# Patient Record
Sex: Female | Born: 1975
Health system: Southern US, Community
[De-identification: ages and names within clinical notes are randomized; demographics above are authoritative.]

## PROBLEM LIST (undated history)

## (undated) DIAGNOSIS — A64 Unspecified sexually transmitted disease: Secondary | ICD-10-CM

## (undated) DIAGNOSIS — R87629 Unspecified abnormal cytological findings in specimens from vagina: Secondary | ICD-10-CM

## (undated) DIAGNOSIS — F419 Anxiety disorder, unspecified: Secondary | ICD-10-CM

## (undated) DIAGNOSIS — Z9109 Other allergy status, other than to drugs and biological substances: Secondary | ICD-10-CM

## (undated) DIAGNOSIS — N841 Polyp of cervix uteri: Secondary | ICD-10-CM

## (undated) DIAGNOSIS — R011 Cardiac murmur, unspecified: Secondary | ICD-10-CM

## (undated) DIAGNOSIS — A630 Anogenital (venereal) warts: Secondary | ICD-10-CM

## (undated) DIAGNOSIS — K219 Gastro-esophageal reflux disease without esophagitis: Secondary | ICD-10-CM

## (undated) DIAGNOSIS — J40 Bronchitis, not specified as acute or chronic: Secondary | ICD-10-CM

## (undated) DIAGNOSIS — R51 Headache: Secondary | ICD-10-CM

## (undated) DIAGNOSIS — I10 Essential (primary) hypertension: Secondary | ICD-10-CM

## (undated) DIAGNOSIS — R519 Headache, unspecified: Secondary | ICD-10-CM

## (undated) DIAGNOSIS — T7840XA Allergy, unspecified, initial encounter: Secondary | ICD-10-CM

## (undated) DIAGNOSIS — I493 Ventricular premature depolarization: Secondary | ICD-10-CM

## (undated) DIAGNOSIS — Z87898 Personal history of other specified conditions: Secondary | ICD-10-CM

## (undated) HISTORY — DX: Allergy, unspecified, initial encounter: T78.40XA

## (undated) HISTORY — DX: Anogenital (venereal) warts: A63.0

## (undated) HISTORY — DX: Unspecified abnormal cytological findings in specimens from vagina: R87.629

## (undated) HISTORY — DX: Morbid (severe) obesity due to excess calories: E66.01

## (undated) HISTORY — DX: Unspecified sexually transmitted disease: A64

## (undated) HISTORY — DX: Ventricular premature depolarization: I49.3

## (undated) HISTORY — DX: Essential (primary) hypertension: I10

## (undated) HISTORY — DX: Bronchitis, not specified as acute or chronic: J40

## (undated) HISTORY — DX: Gastro-esophageal reflux disease without esophagitis: K21.9

## (undated) HISTORY — DX: Polyp of cervix uteri: N84.1

## (undated) HISTORY — DX: Anxiety disorder, unspecified: F41.9

## (undated) HISTORY — PX: WISDOM TOOTH EXTRACTION: SHX21

## (undated) HISTORY — DX: Headache: R51

## (undated) HISTORY — DX: Other allergy status, other than to drugs and biological substances: Z91.09

## (undated) HISTORY — DX: Headache, unspecified: R51.9

## (undated) HISTORY — DX: Personal history of other specified conditions: Z87.898

---

## 2006-04-11 ENCOUNTER — Other Ambulatory Visit: Admission: RE | Admit: 2006-04-11 | Discharge: 2006-04-11 | Payer: Self-pay | Admitting: Family Medicine

## 2006-08-01 ENCOUNTER — Emergency Department (HOSPITAL_COMMUNITY): Admission: EM | Admit: 2006-08-01 | Discharge: 2006-08-01 | Payer: Self-pay | Admitting: Emergency Medicine

## 2006-11-16 ENCOUNTER — Emergency Department (HOSPITAL_COMMUNITY): Admission: EM | Admit: 2006-11-16 | Discharge: 2006-11-17 | Payer: Self-pay | Admitting: Emergency Medicine

## 2006-11-28 ENCOUNTER — Emergency Department (HOSPITAL_COMMUNITY): Admission: EM | Admit: 2006-11-28 | Discharge: 2006-11-29 | Payer: Self-pay | Admitting: Emergency Medicine

## 2007-02-23 ENCOUNTER — Emergency Department (HOSPITAL_COMMUNITY): Admission: EM | Admit: 2007-02-23 | Discharge: 2007-02-23 | Payer: Self-pay | Admitting: Emergency Medicine

## 2007-04-17 ENCOUNTER — Other Ambulatory Visit: Admission: RE | Admit: 2007-04-17 | Discharge: 2007-04-17 | Payer: Self-pay | Admitting: Family Medicine

## 2007-05-23 ENCOUNTER — Encounter: Admission: RE | Admit: 2007-05-23 | Discharge: 2007-06-26 | Payer: Self-pay | Admitting: Family Medicine

## 2008-05-12 ENCOUNTER — Other Ambulatory Visit: Admission: RE | Admit: 2008-05-12 | Discharge: 2008-05-12 | Payer: Self-pay | Admitting: Family Medicine

## 2008-11-10 ENCOUNTER — Other Ambulatory Visit: Admission: RE | Admit: 2008-11-10 | Discharge: 2008-11-10 | Payer: Self-pay | Admitting: Family Medicine

## 2009-05-29 ENCOUNTER — Other Ambulatory Visit: Admission: RE | Admit: 2009-05-29 | Discharge: 2009-05-29 | Payer: Self-pay | Admitting: Family Medicine

## 2010-04-06 ENCOUNTER — Inpatient Hospital Stay (HOSPITAL_COMMUNITY)
Admission: AD | Admit: 2010-04-06 | Discharge: 2010-04-06 | Payer: Self-pay | Source: Home / Self Care | Admitting: Obstetrics and Gynecology

## 2010-04-08 ENCOUNTER — Inpatient Hospital Stay (HOSPITAL_COMMUNITY)
Admission: AD | Admit: 2010-04-08 | Discharge: 2010-04-08 | Payer: Self-pay | Source: Home / Self Care | Admitting: Obstetrics and Gynecology

## 2010-04-08 DIAGNOSIS — O200 Threatened abortion: Secondary | ICD-10-CM

## 2010-04-10 ENCOUNTER — Ambulatory Visit (HOSPITAL_COMMUNITY)
Admission: RE | Admit: 2010-04-10 | Discharge: 2010-04-10 | Payer: Self-pay | Source: Home / Self Care | Attending: Obstetrics and Gynecology | Admitting: Obstetrics and Gynecology

## 2010-05-02 DIAGNOSIS — Z87898 Personal history of other specified conditions: Secondary | ICD-10-CM

## 2010-05-02 HISTORY — DX: Personal history of other specified conditions: Z87.898

## 2010-05-02 HISTORY — PX: COLPOSCOPY: SHX161

## 2010-07-13 LAB — HCG, QUANTITATIVE, PREGNANCY
hCG, Beta Chain, Quant, S: 5391 m[IU]/mL — ABNORMAL HIGH (ref <5)
hCG, Beta Chain, Quant, S: 7012 m[IU]/mL — ABNORMAL HIGH (ref <5)

## 2010-07-13 LAB — CBC
HCT: 36.6 % (ref 36.0–46.0)
Hemoglobin: 12 g/dL (ref 12.0–15.0)
MCH: 27.2 pg (ref 26.0–34.0)
MCHC: 32.7 g/dL (ref 30.0–36.0)
MCV: 83.3 fL (ref 78.0–100.0)
Platelets: 344 10*3/uL (ref 150–400)
RBC: 4.39 MIL/uL (ref 3.87–5.11)
RDW: 15.2 % (ref 11.5–15.5)
WBC: 12 10*3/uL — ABNORMAL HIGH (ref 4.0–10.5)

## 2010-07-13 LAB — URINALYSIS, ROUTINE W REFLEX MICROSCOPIC
Bilirubin Urine: NEGATIVE
Glucose, UA: NEGATIVE mg/dL
Ketones, ur: NEGATIVE mg/dL
Nitrite: NEGATIVE
Specific Gravity, Urine: <1.005 — ABNORMAL LOW (ref 1.005–1.030)
pH: 6 (ref 5.0–8.0)

## 2010-07-13 LAB — ABO/RH: ABO/RH(D): A POS

## 2010-07-13 LAB — URINE MICROSCOPIC-ADD ON

## 2010-07-28 ENCOUNTER — Inpatient Hospital Stay (INDEPENDENT_AMBULATORY_CARE_PROVIDER_SITE_OTHER)
Admission: RE | Admit: 2010-07-28 | Discharge: 2010-07-28 | Disposition: A | Discharge status: Home / Self Care | Payer: 59 | Source: Ambulatory Visit | Attending: Emergency Medicine | Admitting: Emergency Medicine

## 2010-07-28 DIAGNOSIS — H109 Unspecified conjunctivitis: Secondary | ICD-10-CM

## 2010-07-29 ENCOUNTER — Emergency Department (HOSPITAL_COMMUNITY)
Admission: EM | Admit: 2010-07-29 | Discharge: 2010-07-30 | Disposition: A | Discharge status: Home / Self Care | Payer: 59 | Attending: Emergency Medicine | Admitting: Emergency Medicine

## 2010-07-29 DIAGNOSIS — R209 Unspecified disturbances of skin sensation: Secondary | ICD-10-CM | POA: Insufficient documentation

## 2010-07-29 DIAGNOSIS — R51 Headache: Secondary | ICD-10-CM | POA: Insufficient documentation

## 2010-07-29 DIAGNOSIS — F410 Panic disorder [episodic paroxysmal anxiety] without agoraphobia: Secondary | ICD-10-CM | POA: Insufficient documentation

## 2010-07-29 DIAGNOSIS — I10 Essential (primary) hypertension: Secondary | ICD-10-CM | POA: Insufficient documentation

## 2010-07-29 LAB — URINALYSIS, ROUTINE W REFLEX MICROSCOPIC
Bilirubin Urine: NEGATIVE
Glucose, UA: NEGATIVE mg/dL
Hgb urine dipstick: NEGATIVE
Ketones, ur: NEGATIVE mg/dL
Nitrite: NEGATIVE
Protein, ur: NEGATIVE mg/dL
Specific Gravity, Urine: 1.018 (ref 1.005–1.030)
Urobilinogen, UA: 0.2 mg/dL (ref 0.0–1.0)
pH: 6 (ref 5.0–8.0)

## 2010-07-29 LAB — POCT I-STAT, CHEM 8
BUN: 14 mg/dL (ref 6–23)
Calcium, Ion: 1 mmol/L — ABNORMAL LOW (ref 1.12–1.32)
Chloride: 104 meq/L (ref 96–112)
Creatinine, Ser: 0.9 mg/dL (ref 0.4–1.2)
Glucose, Bld: 91 mg/dL (ref 70–99)
HCT: 37 % (ref 36.0–46.0)
Hemoglobin: 12.6 g/dL (ref 12.0–15.0)
Potassium: 3.7 mEq/L (ref 3.5–5.1)
Sodium: 137 mEq/L (ref 135–145)
TCO2: 22 mmol/L (ref 0–100)

## 2010-07-29 LAB — POCT PREGNANCY, URINE: Preg Test, Ur: NEGATIVE

## 2010-10-04 ENCOUNTER — Other Ambulatory Visit: Payer: Self-pay | Admitting: Obstetrics and Gynecology

## 2010-10-04 ENCOUNTER — Other Ambulatory Visit (HOSPITAL_COMMUNITY)
Admission: RE | Admit: 2010-10-04 | Discharge: 2010-10-04 | Disposition: A | Discharge status: Home / Self Care | Payer: 59 | Source: Ambulatory Visit | Attending: Obstetrics and Gynecology | Admitting: Obstetrics and Gynecology

## 2010-10-04 DIAGNOSIS — Z01419 Encounter for gynecological examination (general) (routine) without abnormal findings: Secondary | ICD-10-CM | POA: Insufficient documentation

## 2011-01-05 ENCOUNTER — Inpatient Hospital Stay (INDEPENDENT_AMBULATORY_CARE_PROVIDER_SITE_OTHER)
Admission: RE | Admit: 2011-01-05 | Discharge: 2011-01-05 | Disposition: A | Discharge status: Home / Self Care | Payer: 59 | Source: Ambulatory Visit | Attending: Family Medicine | Admitting: Family Medicine

## 2011-01-05 DIAGNOSIS — R071 Chest pain on breathing: Secondary | ICD-10-CM

## 2011-01-19 ENCOUNTER — Ambulatory Visit: Payer: 59 | Admitting: *Deleted

## 2011-01-20 ENCOUNTER — Encounter: Payer: Self-pay | Admitting: *Deleted

## 2011-02-14 LAB — POCT CARDIAC MARKERS
CKMB, poc: <1 — ABNORMAL LOW
Myoglobin, poc: 64.9
Operator id: 1192
Troponin i, poc: <0.05

## 2011-02-14 LAB — COMPREHENSIVE METABOLIC PANEL
Albumin: 4.4
BUN: 11
Calcium: 9.5
Creatinine, Ser: 0.62
GFR calc Af Amer: >60
Total Bilirubin: 0.8
Total Protein: 7.9

## 2011-02-14 LAB — DIFFERENTIAL
Basophils Absolute: 0
Lymphocytes Relative: 22
Lymphs Abs: 2.5
Monocytes Absolute: 0.6
Monocytes Relative: 6
Neutro Abs: 8.2 — ABNORMAL HIGH

## 2011-02-14 LAB — CBC
HCT: 40.3
MCHC: 33.3
MCV: 79.9
Platelets: 413 — ABNORMAL HIGH
RDW: 14.4 — ABNORMAL HIGH

## 2011-04-21 ENCOUNTER — Encounter: Payer: Self-pay | Admitting: *Deleted

## 2011-04-21 ENCOUNTER — Encounter: Payer: 59 | Attending: Family Medicine | Admitting: *Deleted

## 2011-04-21 ENCOUNTER — Ambulatory Visit: Payer: 59 | Admitting: *Deleted

## 2011-04-21 DIAGNOSIS — Z713 Dietary counseling and surveillance: Secondary | ICD-10-CM | POA: Insufficient documentation

## 2011-04-21 NOTE — Progress Notes (Signed)
Medical Nutrition Therapy:  Appt start time: 1115  End time:  1200.  Assessment:  Morbid Obesity.  Pt here for assessment of morbid obesity. Reports an initial weight goal of <200 lbs.  Pt recently down to 245 lbs by decreasing portions, walking daily, and eliminating fried foods, but grew bored of monotony and quit.  Gained back to current wt of 270.5 lbs. Discussed restarting previous regimen with support system in place.  Meal skipping noted and discussed. Pt has strong FMH of T2DM, but BG currently WNL. States MD is following.  MEDICATIONS:  See medication list; reconciled with pt.   DIETARY INTAKE:  Usual eating pattern includes 2 meals and 1-2 snacks per day.  24-hr recall: Unable to obtain d/t lack of time. Pt arrived late and had to leave early. Will attempt to obtain at follow up.  Usual physical activity:  60 min Zumba classes, 3x/wk - (started 3 wks ago)  Estimated energy needs: 1500 calories 180 g carbohydrates 100-110 g protein 40-42 g fat  Progress Towards Goal(s):  In progress.   Nutritional Diagnosis:  [place]-3.3 Morbid obesity related to sedentary lifestyle and excessive portions at meals as evidenced by patient report of excessive food intake and a BMI of 47.9 kg/m^2.    Intervention/Goals:  Eat 3 meals/day, Avoid meal skipping.   Add lean protein-rich foods to all meals and snacks.  Limit carbohydrate to 45 (up to 60) grams per meal and 15 grams per snack.  Choose more whole grains, lean protein, low-fat dairy, and fruits/non-starchy vegetables.   Continue current physical activity regimen; Aim to increase to daily activity.  Limit sugar-sweetened beverages, concentrated sweets, high carbohydrate foods, and high fat/fried foods.  For high blood pressure, aim for <2000 mg of sodium daily. Replace salt in cooking with spices (see handout).  Aim for 25-30 grams of fiber daily.  Monitoring/Evaluation:  Dietary intake, exercise, and body weight in 4 week(s).

## 2011-04-21 NOTE — Patient Instructions (Signed)
Goals:  Eat 3 meals/day, Avoid meal skipping.   Add lean protein-rich foods to all meals and snacks.  Limit carbohydrate to 45 (up to 60) grams per meal and 15 grams per snack.  Choose more whole grains, lean protein, low-fat dairy, and fruits/non-starchy vegetables.   Continue current physical activity regimen; Aim to increase to daily activity.  Limit sugar-sweetened beverages, concentrated sweets, high carbohydrate foods, and high fat/fried foods.  For high blood pressure, aim for <2000 mg of sodium daily. Replace salt in cooking with spices (see handout).  Aim for 25-30 grams of fiber daily.

## 2011-05-20 ENCOUNTER — Ambulatory Visit: Payer: 59 | Admitting: Dietician

## 2011-05-27 ENCOUNTER — Ambulatory Visit: Payer: 59 | Admitting: Dietician

## 2011-10-12 ENCOUNTER — Encounter: Payer: Self-pay | Admitting: Family Medicine

## 2011-10-12 ENCOUNTER — Ambulatory Visit (INDEPENDENT_AMBULATORY_CARE_PROVIDER_SITE_OTHER): Payer: 59 | Admitting: Family Medicine

## 2011-10-12 VITALS — BP 130/90 | HR 85 | Temp 98.6°F | Ht 64.5 in | Wt 268.4 lb

## 2011-10-12 DIAGNOSIS — Z9109 Other allergy status, other than to drugs and biological substances: Secondary | ICD-10-CM

## 2011-10-12 DIAGNOSIS — Z833 Family history of diabetes mellitus: Secondary | ICD-10-CM

## 2011-10-12 DIAGNOSIS — J309 Allergic rhinitis, unspecified: Secondary | ICD-10-CM

## 2011-10-12 DIAGNOSIS — N390 Urinary tract infection, site not specified: Secondary | ICD-10-CM

## 2011-10-12 DIAGNOSIS — J329 Chronic sinusitis, unspecified: Secondary | ICD-10-CM

## 2011-10-12 DIAGNOSIS — I10 Essential (primary) hypertension: Secondary | ICD-10-CM | POA: Insufficient documentation

## 2011-10-12 LAB — CBC WITH DIFFERENTIAL/PLATELET
Basophils Relative: 0.5 % (ref 0.0–3.0)
Eosinophils Relative: 1.2 % (ref 0.0–5.0)
HCT: 37.8 % (ref 36.0–46.0)
Hemoglobin: 12.1 g/dL (ref 12.0–15.0)
Lymphs Abs: 2.4 10*3/uL (ref 0.7–4.0)
MCV: 82 fl (ref 78.0–100.0)
Monocytes Relative: 5.4 % (ref 3.0–12.0)
Neutro Abs: 4.6 10*3/uL (ref 1.4–7.7)
WBC: 7.5 10*3/uL (ref 4.5–10.5)

## 2011-10-12 LAB — HEPATIC FUNCTION PANEL
ALT: 21 U/L (ref 0–35)
AST: 22 U/L (ref 0–37)
Albumin: 3.9 g/dL (ref 3.5–5.2)
Alkaline Phosphatase: 59 U/L (ref 39–117)
Total Protein: 6.9 g/dL (ref 6.0–8.3)

## 2011-10-12 LAB — POCT URINALYSIS DIPSTICK
Glucose, UA: NEGATIVE
Ketones, UA: NEGATIVE
Protein, UA: NEGATIVE

## 2011-10-12 LAB — BASIC METABOLIC PANEL
Chloride: 102 mEq/L (ref 96–112)
GFR: 121.51 mL/min (ref >60.00)
Potassium: 3.7 mEq/L (ref 3.5–5.1)
Sodium: 136 mEq/L (ref 135–145)

## 2011-10-12 LAB — TSH: TSH: 2.21 u[IU]/mL (ref 0.35–5.50)

## 2011-10-12 LAB — HEMOGLOBIN A1C: Hgb A1c MFr Bld: 5.8 % (ref 4.6–6.5)

## 2011-10-12 LAB — LIPID PANEL: VLDL: 13.2 mg/dL (ref 0.0–40.0)

## 2011-10-12 MED ORDER — LISINOPRIL-HYDROCHLOROTHIAZIDE 20-12.5 MG PO TABS: 2.0000 | ORAL_TABLET | Freq: Every day | ORAL | Status: DC

## 2011-10-12 MED ORDER — CLARITHROMYCIN ER 500 MG PO TB24: 1000.0000 mg | ORAL_TABLET | Freq: Every day | ORAL | Status: AC

## 2011-10-12 MED ORDER — FLUTICASONE PROPIONATE 50 MCG/ACT NA SUSP: 2.0000 | Freq: Every day | NASAL | Status: DC

## 2011-10-12 NOTE — Addendum Note (Signed)
Addended by: Silvio Pate D on: 10/12/2011 02:51 PM   Modules accepted: Orders

## 2011-10-12 NOTE — Progress Notes (Signed)
  Subjective:     Erica Ramos is a 36 y.o. female who presents for evaluation of sinus pain. Symptoms include: congestion, cough, facial pain, fevers, headaches, nasal congestion, sinus pressure and sneezing. Onset of symptoms was 4 weeks ago. Symptoms have been gradually worsening since that time. Past history is significant for no history of pneumonia or bronchitis. Patient is a non-smoker.  Pt was seen by previous physician and given a z pak-- she is no better.  Pt would also like labs done---she  Has family history diabetes.  \  s  The following portions of the patient's history were reviewed and updated as appropriate: allergies, current medications, past family history, past medical history, past social history, past surgical history and problem list.  Review of Systems Pertinent items are noted in HPI.   Objective:    Pulse 85  Temp 98.6 F (37 C) (Oral)  Ht 5' 4.5" (1.638 m)  Wt 268 lb 6.4 oz (121.745 kg)  BMI 45.36 kg/m2  SpO2 99%  LMP 10/03/2011 General appearance: alert, cooperative, appears stated age and no distress Ears: normal TM's and external ear canals both ears Nose: green discharge, moderate congestion, turbinates red, swollen, sinus tenderness bilateral Throat: lips, mucosa, and tongue normal; teeth and gums normal Neck: mild anterior cervical adenopathy, supple, symmetrical, trachea midline and thyroid not enlarged, symmetric, no tenderness/mass/nodules Lungs: clear to auscultation bilaterally Heart: S1, S2 normal Extremities: edema trace pitting edema Lymph nodes: Cervical adenopathy: b/l    Assessment:    Acute bacterial sinusitis.  Hypertension--- increase med to lisinopril 20/ 12.5 --- 2 po qd fam hx diabetes-- check labs Allergies--con'tflonase and zyrtec---check allergy test    Plan:    Nasal steroids per medication orders. Antihistamines per medication orders. Biaxin per medication orders. Follow up in 3 months or as needed.

## 2011-10-12 NOTE — Patient Instructions (Addendum)

## 2011-10-13 LAB — ~~LOC~~ ALLERGY PANEL
Allergen, Comm Silver Birch, t9: 0.11 kU/L (ref <0.35)
Allergen, Mulberry, t76: <0.1 kU/l (ref <0.35)
Alternaria Alternata: 3.33 kU/L — ABNORMAL HIGH (ref <0.35)
Bahia Grass: 1.65 kU/L — ABNORMAL HIGH (ref <0.35)
Cat Dander: 3.16 kU/L — ABNORMAL HIGH (ref <0.35)
Cladosporium Herbarum: 0.14 kU/L (ref <0.35)
Elm IgE: 0.16 kU/L (ref <0.35)
Johnson Grass: 1.12 kU/L — ABNORMAL HIGH (ref <0.35)
Mugwort: <0.1 kU/L (ref <0.35)
Oak: 0.17 kU/L (ref <0.35)
Penicillium Notatum: <0.1 kU/L (ref <0.35)

## 2011-10-13 LAB — ALLERGEN FOOD PROFILE SPECIFIC IGE
Apple: <0.1 kU/L (ref <0.35)
Egg White IgE: <0.1 kU/L (ref <0.35)
Fish Cod: <0.1 kU/L (ref <0.35)
IgE (Immunoglobulin E), Serum: 87.4 IU/mL (ref 0.0–180.0)
Milk IgE: <0.1 kU/L (ref <0.35)
Shrimp IgE: <0.1 kU/L (ref <0.35)
Wheat IgE: 0.3 kU/L (ref <0.35)

## 2011-10-15 LAB — URINE CULTURE

## 2011-10-18 ENCOUNTER — Other Ambulatory Visit: Payer: Self-pay | Admitting: *Deleted

## 2011-10-18 MED ORDER — CIPROFLOXACIN HCL 500 MG PO TABS: 500.0000 mg | ORAL_TABLET | Freq: Two times a day (BID) | ORAL | Status: AC

## 2011-11-04 ENCOUNTER — Telehealth: Payer: Self-pay | Admitting: Family Medicine

## 2011-11-04 NOTE — Telephone Encounter (Signed)
Patient called and stated she now has a yeast infection from Clarithromycin that she was taking from her last visit, can she get something for this today called into [facility] on [address] Patient callback  ph# [phone]

## 2011-11-05 ENCOUNTER — Other Ambulatory Visit: Payer: Self-pay | Admitting: Internal Medicine

## 2011-11-05 DIAGNOSIS — B373 Candidiasis of vulva and vagina: Secondary | ICD-10-CM

## 2011-11-05 MED ORDER — FLUCONAZOLE 150 MG PO TABS: 150.0000 mg | ORAL_TABLET | Freq: Once | ORAL | Status: AC

## 2011-11-05 NOTE — Telephone Encounter (Signed)
I had NOT received by 5:15 pm on 7/5. Rx called in Sat 7/6

## 2011-11-07 NOTE — Telephone Encounter (Addendum)
Left message to call office to advise Pt Rx sent.

## 2011-11-07 NOTE — Telephone Encounter (Signed)
LMOM with contact name & number to inform patient requested Rx has been sent to pharmacy/[facility]

## 2011-11-15 ENCOUNTER — Encounter: Payer: Self-pay | Admitting: Family Medicine

## 2011-11-15 ENCOUNTER — Ambulatory Visit (INDEPENDENT_AMBULATORY_CARE_PROVIDER_SITE_OTHER): Payer: 59 | Admitting: Family Medicine

## 2011-11-15 ENCOUNTER — Telehealth: Payer: Self-pay | Admitting: Family Medicine

## 2011-11-15 VITALS — BP 118/80 | HR 94 | Temp 98.6°F | Wt 265.6 lb

## 2011-11-15 DIAGNOSIS — H10029 Other mucopurulent conjunctivitis, unspecified eye: Secondary | ICD-10-CM | POA: Insufficient documentation

## 2011-11-15 MED ORDER — MOXIFLOXACIN HCL 0.5 % OP SOLN: 1.0000 [drp] | Freq: Three times a day (TID) | OPHTHALMIC | Status: AC

## 2011-11-15 NOTE — Assessment & Plan Note (Signed)
vigamox x 7 daily Ho given to patient Call if symptoms no better in 3-4 days

## 2011-11-15 NOTE — Telephone Encounter (Signed)
Caller: Loreley/Patient; PCP: Lelon Perla.; CB#: [phone]; LMP:  07/09.   Call regarding Left Eye irritation onset 07/14, itching and redness on 07/15, discharge on 07/16.   Afebrile.   Triage offered and declined.  Appt scheduled 07/16 at 1530 with Loreen Freud DO.  Call back parameters reviewed.

## 2011-11-15 NOTE — Patient Instructions (Signed)
Bacterial Conjunctivitis Conjunctivitis is an irritation (inflammation) of the clear membrane that covers the [place] part of the eye (conjunctiva). The irritation can also happen on the underside of the eyelids. Conjunctivitis makes the eye red or pink in color. This is what is commonly known as pink eye. CAUSES   Infection from a germ (bacteria) on the surface of the eye.   Infection from the irritation or injury of nearby tissues such as the eyelids or cornea.   More serious inflammation or infection on the inside of the eye.   Other eye diseases.   The use of certain eye medications.  SYMPTOMS  The normally [place] color of the eye or the underside of the eyelid is usually pink or red in color. The pink eye is usually associated with irritation, tearing and some sensitivity to light. Bacterial conjunctivitis is often associated with a thick, yellowish discharge from the eye. If a discharge is present, there may also be some blurred vision in the affected eye. DIAGNOSIS  Conjunctivitis is diagnosed by an eye exam. The eye specialist looks for changes in the surface tissues of the eye which take on changes that point to the specific type of conjunctivitis. A sample of any discharge may be collected on a Q-Tip (sterile swap). The sample will be sent to a lab to see whether or not the inflammation is caused by bacterial or viral infection. TREATMENT  Bacterial conjunctivitis is treated with medicines that kill germs (antibiotics). Drops are most often used. However, antibiotic ointments are available and may be preferred by some patients. Antibiotics by mouth (oral) are sometimes used. Artificial tears or eye washes may ease discomfort. HOME CARE INSTRUCTIONS   To ease discomfort, apply a cool, clean wash cloth to the eye for 10 to 20 minutes, 3 to 4 times a day.   Gently wipe away any drainage from the eye with a warm, wet washcloth or a cotton ball.   Wash your hands often with soap. Use paper  towels to dry.   Do not share towels or wash cloths. This may spread the infection.   Change or wash your pillow case every day.   You should not use eye make-up until the infection is gone.   Do not operate machinery or drive if vision is blurred.   Stop using contacts lenses. Ask your eye professional how to sterilize or replace them before using again. This depends on the type of contact lenses used.   Do not touch the edge of the eyelid with the eye drop bottle or ointment tube when applying medications to the affected eye. This will stop you from spreading the infection to the other eye or to others. Do as your caregiver tell you.  SEEK IMMEDIATE MEDICAL CARE IF:   The infection has not improved within 3 days of beginning treatment.   A yellow discharge from the eye develops.   Pain in the eye increases.   The redness is spreading.   Vision becomes blurred.   An oral temperature above 102 F (38.9 C) develops, or as your caregiver suggests.   Facial pain, redness or swelling develops.   Any problems that may be related to the prescribed medicine develops.  MAKE SURE YOU:   Understand these instructions.   Will watch your condition.   Will get help right away if you are not doing well or get worse.  Document Released: 04/18/2005 Document Revised: 04/07/2011 Document Reviewed: 12/06/2007 [employer] Patient Information 2012 [employer]. 

## 2011-11-15 NOTE — Telephone Encounter (Signed)
Seen today.   KP 

## 2011-11-15 NOTE — Progress Notes (Signed)
  Subjective:    Patient ID: Erica Ramos, female    DOB: [birth year], 36 y.o.   MRN: [id number]  HPI Pt here c/o red L eye with d/c x few days and headaches in back of head and some frontal headaches.     Review of Systems As above    Objective:   Physical Exam  Constitutional: She appears well-developed and well-nourished.  HENT:  Head: Normocephalic.  Right Ear: External ear normal.  Left Ear: External ear normal.  Mouth/Throat: Oropharynx is clear and moist.  Eyes: EOM are normal. Pupils are equal, round, and reactive to light. Right eye exhibits no discharge. Left eye exhibits discharge. No scleral icterus.         Left eye----+red and tearing, flourescene and wood lamp used - abrasions , no f/b          Assessment & Plan:

## 2012-01-16 ENCOUNTER — Encounter: Payer: Self-pay | Admitting: Family Medicine

## 2012-01-16 ENCOUNTER — Ambulatory Visit (INDEPENDENT_AMBULATORY_CARE_PROVIDER_SITE_OTHER): Payer: 59 | Admitting: Family Medicine

## 2012-01-16 VITALS — BP 120/80 | HR 68 | Temp 98.6°F | Ht 63.0 in | Wt 268.0 lb

## 2012-01-16 DIAGNOSIS — I10 Essential (primary) hypertension: Secondary | ICD-10-CM

## 2012-01-16 MED ORDER — LISINOPRIL-HYDROCHLOROTHIAZIDE 20-12.5 MG PO TABS: 2.0000 | ORAL_TABLET | Freq: Every day | ORAL | Status: DC

## 2012-01-16 NOTE — Progress Notes (Signed)
  Subjective:    Patient here for follow-up of elevated blood pressure.  She is exercising and is adherent to a low-salt diet.  Blood pressure is well controlled at home. Cardiac symptoms: none. Patient denies: chest pain, chest pressure/discomfort, claudication, dyspnea, exertional chest pressure/discomfort, fatigue, irregular heart beat, lower extremity edema, near-syncope, orthopnea, palpitations, paroxysmal nocturnal dyspnea, syncope and tachypnea. Cardiovascular risk factors: hypertension and obesity (BMI >= 30 kg/m2). Use of agents associated with hypertension: none. History of target organ damage: none.  The following portions of the patient's history were reviewed and updated as appropriate: allergies, current medications, past family history, past medical history, past social history, past surgical history and problem list.  Review of Systems Pertinent items are noted in HPI.     Objective:    BP 120/80  Pulse 68  Temp 98.6 F (37 C) (Oral)  Ht 5\' 3"  (1.6 m)  Wt 268 lb (121.564 kg)  BMI 47.47 kg/m2  SpO2 98% General appearance: alert, cooperative, appears stated age and no distress Lungs: clear to auscultation bilaterally Heart: S1, S2 normal Extremities: extremities normal, atraumatic, no cyanosis or edema    Assessment:  1.  Hypertension, normal blood pressure . Evidence of target organ damage: none.   2.  Obesity--d/w pat diet and exercise-- paleo and flat belly diet Plan:    Medication: no change. Dietary sodium restriction. Regular aerobic exercise. Check blood pressures 2-3 times weekly and record. Follow up: 6 months and as needed.

## 2012-01-16 NOTE — Patient Instructions (Addendum)
Exercise to Lose Weight Exercise and a healthy diet may help you lose weight. Your doctor may suggest specific exercises. EXERCISE IDEAS AND TIPS  Choose low-cost things you enjoy doing, such as walking, bicycling, or exercising to workout videos.   Take stairs instead of the elevator.   Walk during your lunch break.   Park your car further away from work or school.   Go to a gym or an exercise class.   Start with 5 to 10 minutes of exercise each day. Build up to 30 minutes of exercise 4 to 6 days a week.   Wear shoes with good support and comfortable clothes.   Stretch before and after working out.   Work out until you breathe harder and your heart beats faster.   Drink extra water when you exercise.   Do not do so much that you hurt yourself, feel dizzy, or get very short of breath.  Exercises that burn about 150 calories:  Running 1  miles in 15 minutes.   Playing volleyball for 45 to 60 minutes.   Washing and waxing a car for 45 to 60 minutes.   Playing touch football for 45 minutes.   Walking 1  miles in 35 minutes.   Pushing a stroller 1  miles in 30 minutes.   Playing basketball for 30 minutes.   Raking leaves for 30 minutes.   Bicycling 5 miles in 30 minutes.   Walking 2 miles in 30 minutes.   Dancing for 30 minutes.   Shoveling snow for 15 minutes.   Swimming laps for 20 minutes.   Walking up stairs for 15 minutes.   Bicycling 4 miles in 15 minutes.   Gardening for 30 to 45 minutes.   Jumping rope for 15 minutes.   Washing windows or floors for 45 to 60 minutes.  Document Released: 05/21/2010 Document Revised: 04/07/2011 Document Reviewed: 05/21/2010 [facility] Patient Information 2012 [place], [place]. Hypertension As your heart beats, it forces blood through your arteries. This force is your blood pressure. If the pressure is too high, it is called hypertension (HTN) or high blood pressure. HTN is dangerous because you may have it and not  know it. High blood pressure may mean that your heart has to work harder to pump blood. Your arteries may be narrow or stiff. The extra work puts you at risk for heart disease, stroke, and other problems.  Blood pressure consists of two numbers, a higher number over a lower, 110/72, for example. It is stated as "110 over 72." The ideal is below 120 for the top number (systolic) and under 80 for the bottom (diastolic). Write down your blood pressure today. You should pay close attention to your blood pressure if you have certain conditions such as:  Heart failure.   Prior heart attack.   Diabetes   Chronic kidney disease.   Prior stroke.   Multiple risk factors for heart disease.  To see if you have HTN, your blood pressure should be measured while you are seated with your arm held at the level of the heart. It should be measured at least twice. A one-time elevated blood pressure reading (especially in the Emergency Department) does not mean that you need treatment. There may be conditions in which the blood pressure is different between your right and left arms. It is important to see your caregiver soon for a recheck. Most people have essential hypertension which means that there is not a specific cause. This type of high  blood pressure may be lowered by changing lifestyle factors such as:  Stress.   Smoking.   Lack of exercise.   Excessive weight.   Drug/tobacco/alcohol use.   Eating less salt.  Most people do not have symptoms from high blood pressure until it has caused damage to the body. Effective treatment can often prevent, delay or reduce that damage. TREATMENT  When a cause has been identified, treatment for high blood pressure is directed at the cause. There are a large number of medications to treat HTN. These fall into several categories, and your caregiver will help you select the medicines that are best for you. Medications may have side effects. You should review side  effects with your caregiver. If your blood pressure stays high after you have made lifestyle changes or started on medicines,   Your medication(s) may need to be changed.   Other problems may need to be addressed.   Be certain you understand your prescriptions, and know how and when to take your medicine.   Be sure to follow up with your caregiver within the time frame advised (usually within two weeks) to have your blood pressure rechecked and to review your medications.   If you are taking more than one medicine to lower your blood pressure, make sure you know how and at what times they should be taken. Taking two medicines at the same time can result in blood pressure that is too low.  SEEK IMMEDIATE MEDICAL CARE IF:  You develop a severe headache, blurred or changing vision, or confusion.   You have unusual weakness or numbness, or a faint feeling.   You have severe chest or abdominal pain, vomiting, or breathing problems.  MAKE SURE YOU:   Understand these instructions.   Will watch your condition.   Will get help right away if you are not doing well or get worse.  Document Released: 04/18/2005 Document Revised: 04/07/2011 Document Reviewed: 12/07/2007 [facility] Patient Information 2012 [place], [place].

## 2012-01-25 ENCOUNTER — Other Ambulatory Visit: Payer: Self-pay | Admitting: Family Medicine

## 2012-01-25 ENCOUNTER — Telehealth: Payer: Self-pay | Admitting: Family Medicine

## 2012-01-25 DIAGNOSIS — H109 Unspecified conjunctivitis: Secondary | ICD-10-CM

## 2012-01-25 NOTE — Telephone Encounter (Signed)
Referral put in.

## 2012-01-25 NOTE — Telephone Encounter (Signed)
Spoke with patient and she stated she is having problems with the same eye, she is still using drops form before but she keeps having the same issue with that eye, doesn't fele like it is something in and does not know if it is an allergy but she would really like to see an eye doctor.      KP

## 2012-01-25 NOTE — Telephone Encounter (Signed)
pt wants to be referred to Optamology for Dx Pink eye   - Primary  [phone]  Cb# [phone], cell [phone]

## 2012-02-02 ENCOUNTER — Other Ambulatory Visit: Payer: Self-pay | Admitting: Obstetrics and Gynecology

## 2012-02-02 ENCOUNTER — Other Ambulatory Visit (HOSPITAL_COMMUNITY)
Admission: RE | Admit: 2012-02-02 | Discharge: 2012-02-02 | Disposition: A | Discharge status: Home / Self Care | Payer: 59 | Source: Ambulatory Visit | Attending: Obstetrics and Gynecology | Admitting: Obstetrics and Gynecology

## 2012-02-02 ENCOUNTER — Ambulatory Visit: Payer: 59 | Admitting: Family Medicine

## 2012-02-02 DIAGNOSIS — Z01419 Encounter for gynecological examination (general) (routine) without abnormal findings: Secondary | ICD-10-CM | POA: Insufficient documentation

## 2012-02-02 DIAGNOSIS — Z0289 Encounter for other administrative examinations: Secondary | ICD-10-CM

## 2012-02-02 DIAGNOSIS — Z113 Encounter for screening for infections with a predominantly sexual mode of transmission: Secondary | ICD-10-CM | POA: Insufficient documentation

## 2012-03-05 ENCOUNTER — Encounter: Payer: 59 | Admitting: Family Medicine

## 2012-03-08 ENCOUNTER — Ambulatory Visit (INDEPENDENT_AMBULATORY_CARE_PROVIDER_SITE_OTHER): Payer: 59 | Admitting: Internal Medicine

## 2012-03-08 ENCOUNTER — Encounter: Payer: Self-pay | Admitting: Internal Medicine

## 2012-03-08 VITALS — BP 146/92 | HR 105 | Temp 99.2°F | Wt 270.4 lb

## 2012-03-08 DIAGNOSIS — K219 Gastro-esophageal reflux disease without esophagitis: Secondary | ICD-10-CM

## 2012-03-08 DIAGNOSIS — R079 Chest pain, unspecified: Secondary | ICD-10-CM

## 2012-03-08 DIAGNOSIS — R05 Cough: Secondary | ICD-10-CM

## 2012-03-08 DIAGNOSIS — R509 Fever, unspecified: Secondary | ICD-10-CM

## 2012-03-08 DIAGNOSIS — I10 Essential (primary) hypertension: Secondary | ICD-10-CM

## 2012-03-08 MED ORDER — HYDROCODONE-HOMATROPINE 5-1.5 MG/5ML PO SYRP: 5.0000 mL | ORAL_SOLUTION | Freq: Four times a day (QID) | ORAL | Status: DC | PRN

## 2012-03-08 MED ORDER — OMEPRAZOLE 20 MG PO CPDR: 20.0000 mg | DELAYED_RELEASE_CAPSULE | Freq: Every day | ORAL | Status: DC

## 2012-03-08 MED ORDER — AZITHROMYCIN 250 MG PO TABS: ORAL_TABLET | ORAL | Status: DC

## 2012-03-08 NOTE — Patient Instructions (Addendum)
The triggers for reflux  include stress; the "aspirin family" ; alcohol; peppermint; and caffeine (coffee, tea, cola, and chocolate). The aspirin family would include aspirin and the nonsteroidal agents such as ibuprofen &  Naproxen. Tylenol would not cause reflux. If having symptoms ; food & drink should be avoided for @ least 2 hours before going to bed.  Your BP goal = AVERAGE < 135/85. Avoid ingestion of  excess salt/sodium.Cook with pepper & other spices . Use the salt substitute "No Salt"(unless your potassium has been elevated) OR the Mrs Sharilyn Sites products to season food @ the table. Avoid foods which taste salty or "vinegary" as their sodium contentet will be high.

## 2012-03-08 NOTE — Progress Notes (Signed)
  Subjective:    Patient ID: Erica Ramos, female    DOB: Aug 25, 1975, 36 y.o.   MRN: [id number]  HPI  Symptoms began 03/06/12 as an intermittent  burning discomfort in the left upper chest and shoulder area which lasted seconds. It was exacerbated if she lay in the right lateral decubitus position. She states that this was reminiscent of similar symptoms she had in the past with acid reflux. Omeprazole was effective.An OTC "gas " liquid helped the burning. The pain has also been intermittently sharp.  Subsequently she began to have cough as of 11/6 with intermittent production of clear sputum. She has not had definite fever, chills, or sweats although she had low-grade fever here.  There was no specific injury or trigger for this but she was starting it but his main to proximal approximately 2 weeks ago. He    Review of Systems  Nausea/vomiting: no  Diaphoresis: no Shortness of breath:yes  Pleuritic: no  Edema: no PND: no Dizziness: no Palpitations: no Syncope: no  Indigestion: no  Red Flags Worse with exertion: no  Recent Immobility: no Tearing/radiation to back:no       Objective:   Physical Exam General appearance:well nourished; no acute distress or increased work of breathing is present.  No  lymphadenopathy about the head, neck, or axilla noted. Weight excess  Eyes: No conjunctival inflammation or lid edema is present. There is no scleral icterus.  Ears:  External ear exam shows no significant lesions or deformities.  Otoscopic examination reveals clear canals, tympanic membranes are intact bilaterally without bulging, retraction, inflammation or discharge.  Nose:  External nasal examination shows no deformity or inflammation. Nasal mucosa are pink and moist without lesions or exudates. No septal dislocation or deviation.No obstruction to airflow.   Oral exam: Dental hygiene is good; lips and gums are healthy appearing.There is mild  oropharyngeal erythema ; no   exudate noted.   Neck:  No deformities,  masses, or tenderness noted.   Supple with full range of motion without pain.   Heart:  Normal rate and regular rhythm. S1 and S2 normal without gallop,  click, rub or other extra sounds. Grade 1/2-1 over 6 systolic murmur   Lungs:Chest clear to auscultation; no wheezes, rhonchi,rales ,or rubs present.No increased work of breathing. Recurrent hacking cough  Abdomen: No organomegaly or masses. Pressure in the epigastrium causes nausea   Extremities:  No cyanosis, edema, or clubbing  noted . Homans sign negative   Skin: Warm & dry          Assessment & Plan:  #1 atypical chest pain, nonexertional. This replicate previous pain she's had with reflux. EKG-year-old a single pause. There is low voltage in the precordial leads due to body habitus/breast tissue.  #2 cough with clear sputum and low-grade fever. It is noted that she is on an ACE inhibitor. If symptoms persist once any acute infection is resolved and her reflux is controlled;consideration can be given to changing ACE inhibitor to an ARB.  Plan: See orders and recommendations

## 2012-03-09 ENCOUNTER — Ambulatory Visit: Payer: 59 | Admitting: Family Medicine

## 2012-03-12 ENCOUNTER — Telehealth: Payer: Self-pay | Admitting: Family Medicine

## 2012-03-12 MED ORDER — FLUCONAZOLE 150 MG PO TABS: ORAL_TABLET | ORAL | Status: DC

## 2012-03-12 NOTE — Telephone Encounter (Signed)
pt was in last wk got ABX and now has yeaast infection-can we send rx for yeast infection to [facility] [address]. Also noted cough is worse and she is having fever on & off is that normal & will ABX correct this?  Cb# before 2 [phone] cb after 2 [phone]

## 2012-03-12 NOTE — Telephone Encounter (Signed)
Please advise      KP 

## 2012-03-12 NOTE — Telephone Encounter (Signed)
Patient aware Rx sent       KP 

## 2012-03-12 NOTE — Telephone Encounter (Signed)
Diflucan 150 mg  #2   1 po x1 may repeat in 3 days prn

## 2012-03-14 ENCOUNTER — Telehealth: Payer: Self-pay | Admitting: Family Medicine

## 2012-03-14 NOTE — Telephone Encounter (Signed)
Early refill request from [facility] for: Omeprazole 20 mg capsules. Tk 1 c po qd. Qty 90. Last fill 03-08-12

## 2012-03-14 NOTE — Telephone Encounter (Signed)
Rx was sent for 30 with 1 refill.    KP

## 2012-03-23 ENCOUNTER — Telehealth: Payer: Self-pay | Admitting: Family Medicine

## 2012-03-23 NOTE — Telephone Encounter (Signed)
Patient aware and she voiced understanding that she can take something motion sickness.      KP

## 2012-03-23 NOTE — Telephone Encounter (Signed)
Yes she can take it.  Who did she talk to yesterday? --there is no phone note documented.

## 2012-03-23 NOTE — Telephone Encounter (Signed)
Please advise      KP 

## 2012-03-23 NOTE — Telephone Encounter (Signed)
Patient called stating that she called on yesterday to inquire as to whether or not it is safe for her to take motion sickness meds as she is going on a cruise. Patient never received advise. Please advise and inform patient of advice.

## 2012-06-27 ENCOUNTER — Encounter: Payer: Self-pay | Admitting: Family Medicine

## 2012-06-27 ENCOUNTER — Ambulatory Visit (INDEPENDENT_AMBULATORY_CARE_PROVIDER_SITE_OTHER): Payer: 59 | Admitting: Family Medicine

## 2012-06-27 VITALS — BP 120/78 | HR 83 | Temp 98.4°F | Wt 274.4 lb

## 2012-06-27 DIAGNOSIS — I10 Essential (primary) hypertension: Secondary | ICD-10-CM

## 2012-06-27 DIAGNOSIS — J302 Other seasonal allergic rhinitis: Secondary | ICD-10-CM

## 2012-06-27 DIAGNOSIS — J309 Allergic rhinitis, unspecified: Secondary | ICD-10-CM

## 2012-06-27 MED ORDER — CETIRIZINE HCL 10 MG PO TABS: 10.0000 mg | ORAL_TABLET | Freq: Every day | ORAL | Status: DC

## 2012-06-27 NOTE — Patient Instructions (Signed)

## 2012-06-28 ENCOUNTER — Encounter: Payer: Self-pay | Admitting: Family Medicine

## 2012-06-28 DIAGNOSIS — J302 Other seasonal allergic rhinitis: Secondary | ICD-10-CM | POA: Insufficient documentation

## 2012-06-28 NOTE — Assessment & Plan Note (Signed)
Pt requested to go to nutritionist

## 2012-06-28 NOTE — Progress Notes (Signed)
  Subjective:    Patient here for follow-up of elevated blood pressure.  She is not exercising and is adherent to a low-salt diet.  Blood pressure is well controlled at home. Cardiac symptoms: none. Patient denies: chest pain, chest pressure/discomfort, claudication, dyspnea, exertional chest pressure/discomfort, fatigue, irregular heart beat, lower extremity edema, near-syncope, orthopnea, palpitations, paroxysmal nocturnal dyspnea, syncope and tachypnea. Cardiovascular risk factors: dyslipidemia, hypertension, obesity (BMI >= 30 kg/m2) and sedentary lifestyle. Use of agents associated with hypertension: none. History of target organ damage: none.  The following portions of the patient's history were reviewed and updated as appropriate: allergies, current medications, past family history, past medical history, past social history, past surgical history and problem list.  Review of Systems Pertinent items are noted in HPI.     Objective:    BP 120/78  Pulse 83  Temp(Src) 98.4 F (36.9 C) (Oral)  Wt 274 lb 6.4 oz (124.467 kg)  BMI 48.62 kg/m2  SpO2 96% General appearance: alert, cooperative, appears stated age and no distress Eyes: conjunctivae/corneas clear. PERRL, EOM's intact. Fundi benign. Ears: normal TM's and external ear canals both ears Nose: Nares normal. Septum midline. Mucosa normal. No drainage or sinus tenderness. Throat: lips, mucosa, and tongue normal; teeth and gums normal Neck: no adenopathy, supple, symmetrical, trachea midline and thyroid not enlarged, symmetric, no tenderness/mass/nodules Lungs: clear to auscultation bilaterally Heart: S1, S2 normal Extremities: extremities normal, atraumatic, no cyanosis or edema    Assessment:    Hypertension, normal blood pressure . Evidence of target organ damage: none.   Plan:    Medication: no change. Dietary sodium restriction. Regular aerobic exercise. Check blood pressures 2-3 times weekly and record. Follow up: 6  months and as needed.

## 2012-06-28 NOTE — Assessment & Plan Note (Signed)
Antihistamine---  She restarted this after headaches started and headaches resolved

## 2012-07-02 ENCOUNTER — Other Ambulatory Visit: Payer: 59

## 2012-07-31 ENCOUNTER — Other Ambulatory Visit: Payer: Self-pay | Admitting: Family Medicine

## 2012-09-05 ENCOUNTER — Other Ambulatory Visit: Payer: Self-pay | Admitting: *Deleted

## 2012-09-05 MED ORDER — LISINOPRIL-HYDROCHLOROTHIAZIDE 20-12.5 MG PO TABS: ORAL_TABLET | ORAL | Status: DC

## 2012-09-05 NOTE — Telephone Encounter (Signed)
Rx sent 

## 2012-10-30 ENCOUNTER — Ambulatory Visit (INDEPENDENT_AMBULATORY_CARE_PROVIDER_SITE_OTHER): Payer: 59 | Admitting: Family Medicine

## 2012-10-30 ENCOUNTER — Encounter: Payer: Self-pay | Admitting: Family Medicine

## 2012-10-30 VITALS — BP 120/84 | HR 86 | Temp 98.8°F | Wt 269.8 lb

## 2012-10-30 DIAGNOSIS — N760 Acute vaginitis: Secondary | ICD-10-CM

## 2012-10-30 DIAGNOSIS — J329 Chronic sinusitis, unspecified: Secondary | ICD-10-CM

## 2012-10-30 MED ORDER — CLARITHROMYCIN ER 500 MG PO TB24: 1000.0000 mg | ORAL_TABLET | Freq: Every day | ORAL | Status: AC

## 2012-10-30 MED ORDER — FLUCONAZOLE 150 MG PO TABS: ORAL_TABLET | ORAL | Status: DC

## 2012-10-30 MED ORDER — MOMETASONE FUROATE 50 MCG/ACT NA SUSP: 2.0000 | Freq: Every day | NASAL | Status: DC

## 2012-10-30 NOTE — Patient Instructions (Signed)

## 2012-10-30 NOTE — Progress Notes (Signed)
  Subjective:     Erica Ramos is a 37 y.o. female who presents for evaluation of sinus pain. Symptoms include: congestion, nasal congestion, purulent rhinorrhea and sinus pressure. Onset of symptoms was 2 weeks ago. Symptoms have been gradually worsening since that time. Past history is significant for no history of pneumonia or bronchitis. Patient is a non-smoker.  The following portions of the patient's history were reviewed and updated as appropriate: allergies, current medications, past family history, past medical history, past social history, past surgical history and problem list.  Review of Systems Pertinent items are noted in HPI.   Objective:    BP 120/84  Pulse 86  Temp(Src) 98.8 F (37.1 C) (Oral)  Wt 269 lb 12.8 oz (122.38 kg)  BMI 47.8 kg/m2  SpO2 98% General appearance: alert, cooperative, appears stated age and no distress Ears: normal TM's and external ear canals both ears Nose: green discharge, moderate congestion, turbinates red, swollen, sinus tenderness bilateral Throat: lips, mucosa, and tongue normal; teeth and gums normal Neck: mild anterior cervical adenopathy, supple, symmetrical, trachea midline and thyroid not enlarged, symmetric, no tenderness/mass/nodules Lungs: clear to auscultation bilaterally Heart: S1, S2 normal    Assessment:    Acute bacterial sinusitis.    Plan:    Nasal steroids per medication orders. Antihistamines per medication orders. Biaxin per medication orders.

## 2012-11-05 ENCOUNTER — Encounter: Payer: Self-pay | Admitting: Obstetrics and Gynecology

## 2012-11-08 ENCOUNTER — Encounter: Payer: Self-pay | Admitting: Obstetrics and Gynecology

## 2012-11-08 ENCOUNTER — Telehealth: Payer: Self-pay | Admitting: Obstetrics and Gynecology

## 2012-11-08 ENCOUNTER — Other Ambulatory Visit: Payer: Self-pay | Admitting: Obstetrics and Gynecology

## 2012-11-08 ENCOUNTER — Ambulatory Visit (INDEPENDENT_AMBULATORY_CARE_PROVIDER_SITE_OTHER): Payer: 59 | Admitting: Obstetrics and Gynecology

## 2012-11-08 VITALS — BP 130/78 | HR 68 | Ht 63.0 in | Wt 268.0 lb

## 2012-11-08 DIAGNOSIS — Z0000 Encounter for general adult medical examination without abnormal findings: Secondary | ICD-10-CM

## 2012-11-08 DIAGNOSIS — Z23 Encounter for immunization: Secondary | ICD-10-CM

## 2012-11-08 DIAGNOSIS — Z01419 Encounter for gynecological examination (general) (routine) without abnormal findings: Secondary | ICD-10-CM

## 2012-11-08 DIAGNOSIS — Z113 Encounter for screening for infections with a predominantly sexual mode of transmission: Secondary | ICD-10-CM

## 2012-11-08 LAB — STD PANEL

## 2012-11-08 LAB — POCT URINALYSIS DIPSTICK
Bilirubin, UA: NEGATIVE
Blood, UA: NEGATIVE
Ketones, UA: NEGATIVE
Leukocytes, UA: NEGATIVE
pH, UA: 5

## 2012-11-08 NOTE — Patient Instructions (Addendum)
EXERCISE AND DIET:  We recommended that you start or continue a regular exercise program for good health. Regular exercise means any activity that makes your heart beat faster and makes you sweat.  We recommend exercising at least 30 minutes per day at least 3 days a week, preferably 4 or 5.  We also recommend a diet low in fat and sugar.  Inactivity, poor dietary choices and obesity can cause diabetes, heart attack, stroke, and kidney damage, among others.    ALCOHOL AND SMOKING:  Women should limit their alcohol intake to no more than 7 drinks/beers/glasses of wine (combined, not each!) per week. Moderation of alcohol intake to this level decreases your risk of breast cancer and liver damage. And of course, no recreational drugs are part of a healthy lifestyle.  And absolutely no smoking or even second hand smoke. Most people know smoking can cause heart and lung diseases, but did you know it also contributes to weakening of your bones? Aging of your skin?  Yellowing of your teeth and nails?  CALCIUM AND VITAMIN D:  Adequate intake of calcium and Vitamin D are recommended.  The recommendations for exact amounts of these supplements seem to change often, but generally speaking 600 mg of calcium (either carbonate or citrate) and 800 units of Vitamin D per day seems prudent. Certain women may benefit from higher intake of Vitamin D.  If you are among these women, your doctor will have told you during your visit.    PAP SMEARS:  Pap smears, to check for cervical cancer or precancers,  have traditionally been done yearly, although recent scientific advances have shown that most women can have pap smears less often.  However, every woman still should have a physical exam from her gynecologist every year. It will include a breast check, inspection of the vulva and vagina to check for abnormal growths or skin changes, a visual exam of the cervix, and then an exam to evaluate the size and shape of the uterus and  ovaries.  And after 35 years of age, a rectal exam is indicated to check for rectal cancers. We will also provide age appropriate advice regarding health maintenance, like when you should have certain vaccines, screening for sexually transmitted diseases, bone density testing, colonoscopy, mammograms, etc.   MAMMOGRAMS:  All women over 40 years old should have a yearly mammogram. Many facilities now offer a "3D" mammogram, which may cost around $50 extra out of pocket. If possible,  we recommend you accept the option to have the 3D mammogram performed.  It both reduces the number of women who will be called back for extra views which then turn out to be normal, and it is better than the routine mammogram at detecting truly abnormal areas.    COLONOSCOPY:  Colonoscopy to screen for colon cancer is recommended for all women at age 50.  We know, you hate the idea of the prep.  We agree, BUT, having colon cancer and not knowing it is worse!!  Colon cancer so often starts as a polyp that can be seen and removed at colonscopy, which can quite literally save your life!  And if your first colonoscopy is normal and you have no family history of colon cancer, most women don't have to have it again for 10 years.  Once every ten years, you can do something that may end up saving your life, right?  We will be happy to help you get it scheduled when you are ready.    Be sure to check your insurance coverage so you understand how much it will cost.  It may be covered as a preventative service at no cost, but you should check your particular policy.    Tetanus, Diphtheria, Pertussis (Tdap) Vaccine What You Need to Know WHY GET VACCINATED? Tetanus, diphtheria and pertussis can be very serious diseases, even for adolescents and adults. Tdap vaccine can protect us from these diseases. TETANUS (Lockjaw) causes painful muscle tightening and stiffness, usually all over the body.  It can lead to tightening of muscles in the head  and neck so you can't open your mouth, swallow, or sometimes even breathe. Tetanus kills about 1 out of 5 people who are infected. DIPHTHERIA can cause a thick coating to form in the back of the throat.  It can lead to breathing problems, paralysis, heart failure, and death. PERTUSSIS (Whooping Cough) causes severe coughing spells, which can cause difficulty breathing, vomiting and disturbed sleep.  It can also lead to weight loss, incontinence, and rib fractures. Up to 2 in 100 adolescents and 5 in 100 adults with pertussis are hospitalized or have complications, which could include pneumonia and death. These diseases are caused by bacteria. Diphtheria and pertussis are spread from person to person through coughing or sneezing. Tetanus enters the body through cuts, scratches, or wounds. Before vaccines, the [place] saw as many as 200,000 cases a year of diphtheria and pertussis, and hundreds of cases of tetanus. Since vaccination began, tetanus and diphtheria have dropped by about 99% and pertussis by about 80%. TDAP VACCINE Tdap vaccine can protect adolescents and adults from tetanus, diphtheria, and pertussis. One dose of Tdap is routinely given at age 11 or 12. People who did not get Tdap at that age should get it as soon as possible. Tdap is especially important for health care professionals and anyone having close contact with a baby younger than 12 months. Pregnant women should get a dose of Tdap during every pregnancy, to protect the newborn from pertussis. Infants are most at risk for severe, life-threatening complications from pertussis. A similar vaccine, called Td, protects from tetanus and diphtheria, but not pertussis. A Td booster should be given every 10 years. Tdap may be given as one of these boosters if you have not already gotten a dose. Tdap may also be given after a severe cut or burn to prevent tetanus infection. Your doctor can give you more information. Tdap may safely  be given at the same time as other vaccines. SOME PEOPLE SHOULD NOT GET THIS VACCINE  If you ever had a life-threatening allergic reaction after a dose of any tetanus, diphtheria, or pertussis containing vaccine, OR if you have a severe allergy to any part of this vaccine, you should not get Tdap. Tell your doctor if you have any severe allergies.  If you had a coma, or long or multiple seizures within 7 days after a childhood dose of DTP or DTaP, you should not get Tdap, unless a cause other than the vaccine was found. You can still get Td.  Talk to your doctor if you:  have epilepsy or another nervous system problem,  had severe pain or swelling after any vaccine containing diphtheria, tetanus or pertussis,  ever had Guillain-Barr Syndrome (GBS),  aren't feeling well on the day the shot is scheduled. RISKS OF A VACCINE REACTION With any medicine, including vaccines, there is a chance of side effects. These are usually mild and go away on their own, but serious   reactions are also possible. Brief fainting spells can follow a vaccination, leading to injuries from falling. Sitting or lying down for about 15 minutes can help prevent these. Tell your doctor if you feel dizzy or light-headed, or have vision changes or ringing in the ears. Mild problems following Tdap (Did not interfere with activities)  Pain where the shot was given (about 3 in 4 adolescents or 2 in 3 adults)  Redness or swelling where the shot was given (about 1 person in 5)  Mild fever of at least 100.47F (up to about 1 in 25 adolescents or 1 in 100 adults)  Headache (about 3 or 4 people in 10)  Tiredness (about 1 person in 3 or 4)  Nausea, vomiting, diarrhea, stomach ache (up to 1 in 4 adolescents or 1 in 10 adults)  Chills, body aches, sore joints, rash, swollen glands (uncommon) Moderate problems following Tdap (Interfered with activities, but did not require medical attention)  Pain where the shot was given  (about 1 in 5 adolescents or 1 in 100 adults)  Redness or swelling where the shot was given (up to about 1 in 16 adolescents or 1 in 25 adults)  Fever over 102F (about 1 in 100 adolescents or 1 in 250 adults)  Headache (about 3 in 20 adolescents or 1 in 10 adults)  Nausea, vomiting, diarrhea, stomach ache (up to 1 or 3 people in 100)  Swelling of the entire arm where the shot was given (up to about 3 in 100). Severe problems following Tdap (Unable to perform usual activities, required medical attention)  Swelling, severe pain, bleeding and redness in the arm where the shot was given (rare). A severe allergic reaction could occur after any vaccine (estimated less than 1 in a million doses). WHAT IF THERE IS A SERIOUS REACTION? What should I look for?  Look for anything that concerns you, such as signs of a severe allergic reaction, very high fever, or behavior changes. Signs of a severe allergic reaction can include hives, swelling of the face and throat, difficulty breathing, a fast heartbeat, dizziness, and weakness. These would start a few minutes to a few hours after the vaccination. What should I do?  If you think it is a severe allergic reaction or other emergency that can't wait, call 9-1-1 or get the person to the nearest hospital. Otherwise, call your doctor.  Afterward, the reaction should be reported to the "Vaccine Adverse Event Reporting System" (VAERS). Your doctor might file this report, or you can do it yourself through the VAERS web site at www.vaers.LAgents.no, or by calling 1-816-465-8128. VAERS is only for reporting reactions. They do not give medical advice.  THE NATIONAL VACCINE INJURY COMPENSATION PROGRAM The National Vaccine Injury Compensation Program (VICP) is a federal program that was created to compensate people who may have been injured by certain vaccines. Persons who believe they may have been injured by a vaccine can learn about the program and about filing a  claim by calling 1-(410)550-9812 or visiting the VICP website at SpiritualWord.at. HOW CAN I LEARN MORE?  Ask your doctor.  Call your local or state health department.  Contact the Centers for Disease Control and Prevention (CDC):  Call 343-623-3554 or visit CDC's website at PicCapture.uy. CDC Tdap Vaccine VIS (09/08/11) Document Released: 10/18/2011 Document Revised: 01/11/2012 Document Reviewed: 10/18/2011 [employer] Patient Information 2014 [place], [place]. Levonorgestrel intrauterine device (IUD) What is this medicine? LEVONORGESTREL IUD (LEE voe nor jes trel) is a contraceptive (birth control) device. The device  is placed inside the uterus by a healthcare professional. It is used to prevent pregnancy and can also be used to treat heavy bleeding that occurs during your period. Depending on the device, it can be used for 3 to 5 years. This medicine may be used for other purposes; ask your health care provider or pharmacist if you have questions. What should I tell my health care provider before I take this medicine? They need to know if you have any of these conditions: -abnormal Pap smear -cancer of the breast, uterus, or cervix -diabetes -endometritis -genital or pelvic infection now or in the past -have more than one sexual partner or your partner has more than one partner -heart disease -history of an ectopic or tubal pregnancy -immune system problems -IUD in place -liver disease or tumor -problems with blood clots or take blood-thinners -use intravenous drugs -uterus of unusual shape -vaginal bleeding that has not been explained -an unusual or allergic reaction to levonorgestrel, other hormones, silicone, or polyethylene, medicines, foods, dyes, or preservatives -pregnant or trying to get pregnant -breast-feeding How should I use this medicine? This device is placed inside the uterus by a health care professional. Talk to your pediatrician regarding  the use of this medicine in children. Special care may be needed. Overdosage: If you think you have taken too much of this medicine contact a poison control center or emergency room at once. NOTE: This medicine is only for you. Do not share this medicine with others. What if I miss a dose? This does not apply. What may interact with this medicine? Do not take this medicine with any of the following medications: -amprenavir -bosentan -fosamprenavir This medicine may also interact with the following medications: -aprepitant -barbiturate medicines for inducing sleep or treating seizures -bexarotene -griseofulvin -medicines to treat seizures like carbamazepine, ethotoin, felbamate, oxcarbazepine, phenytoin, topiramate -modafinil -pioglitazone -rifabutin -rifampin -rifapentine -some medicines to treat HIV infection like atazanavir, indinavir, lopinavir, nelfinavir, tipranavir, ritonavir -St. John's wort -warfarin This list may not describe all possible interactions. Give your health care provider a list of all the medicines, herbs, non-prescription drugs, or dietary supplements you use. Also tell them if you smoke, drink alcohol, or use illegal drugs. Some items may interact with your medicine. What should I watch for while using this medicine? Visit your doctor or health care professional for regular check ups. See your doctor if you or your partner has sexual contact with others, becomes HIV positive, or gets a sexual transmitted disease. This product does not protect you against HIV infection (AIDS) or other sexually transmitted diseases. You can check the placement of the IUD yourself by reaching up to the top of your vagina with clean fingers to feel the threads. Do not pull on the threads. It is a good habit to check placement after each menstrual period. Call your doctor right away if you feel more of the IUD than just the threads or if you cannot feel the threads at all. The IUD may  come out by itself. You may become pregnant if the device comes out. If you notice that the IUD has come out use a backup birth control method like condoms and call your health care provider. Using tampons will not change the position of the IUD and are okay to use during your period. What side effects may I notice from receiving this medicine? Side effects that you should report to your doctor or health care professional as soon as possible: -allergic reactions like skin rash,  itching or hives, swelling of the face, lips, or tongue -fever, flu-like symptoms -genital sores -high blood pressure -no menstrual period for 6 weeks during use -pain, swelling, warmth in the leg -pelvic pain or tenderness -severe or sudden headache -signs of pregnancy -stomach cramping -sudden shortness of breath -trouble with balance, talking, or walking -unusual vaginal bleeding, discharge -yellowing of the eyes or skin Side effects that usually do not require medical attention (report to your doctor or health care professional if they continue or are bothersome): -acne -breast pain -change in sex drive or performance -changes in weight -cramping, dizziness, or faintness while the device is being inserted -headache -irregular menstrual bleeding within first 3 to 6 months of use -nausea This list may not describe all possible side effects. Call your doctor for medical advice about side effects. You may report side effects to FDA at 1-800-FDA-1088. Where should I keep my medicine? This does not apply. NOTE: This sheet is a summary. It may not cover all possible information. If you have questions about this medicine, talk to your doctor, pharmacist, or health care provider.  2013, [employer]. (05/19/2011 1:54:04 PM)

## 2012-11-08 NOTE — Telephone Encounter (Signed)
Patient left with out signing the medical release. I left patient a message that I would mail the release for her to sign and mail/fax back to our office.

## 2012-11-08 NOTE — Progress Notes (Signed)
Patient tolerated Tdap vaccine well.  Given in left deltoid.

## 2012-11-08 NOTE — Progress Notes (Signed)
Patient ID: Erica Ramos, female   DOB: 1975/09/28, 37 y.o.   MRN: [id number] 37 y.o.   Single    African American   female   [id number]   here for annual exam.   Patient is unclear if she has a history of genital herpes or condyloma.  Patient states she has a mole like area on her inner thigh.   Patient states she had a positive test for HSV but is unclear if she has type I or II.   Patient would like to have STD testing.   Describes clear vaginal discharge today.  No itching, burning, or odor.   Patient taking antibiotics for sinusitis.  Also received Diflucan.  Took one and has one in reserve.   Menstruation is monthly.  No intermenstrual bleeding for the last two months.  Can have pain with periods, but are improved compared to in the past. Is functional. No pain medication usage.   Notes some brown discharge. Used to bleed in between menses.  Saw Dr. Richardson Dopp and did a pelvic ultrasound and an endometrial biopsy and was diagnosed with polyps.  Did not proceed with hysteroscopy.    Patient had abnormal bleeding after took birth control pills several years ago.   Occasionally uses condoms.  Would welcome pregnancy in the future.  Patient's last menstrual period was 10/10/2012.          Sexually active: yes  The current method of family planning is condoms sometimes.    Exercising: no Last mammogram:  never Last pap smear: 08/2011 wnl History of abnormal pap: Yes 2012 had colposcopy (normal biopsies per patient) without any treatment to cervix.  Repeat paps have all been normal. - Dr. Wynelle Link [facility]. Smoking: no Alcohol: drinks socially Last colonoscopy: never Last Bone Density: never  Last tetanus shot: 10-12 years ago Last cholesterol check: unsure  Hgb:       PCP         Urine: Neg   Family History  Problem Relation Age of Onset  . Diabetes Mother   . Hypertension Mother     Under control  . Diabetes Maternal Aunt   . Seizures Maternal Aunt   . Diabetes Maternal  Uncle   . Diabetes Maternal Grandmother   . Hypertension Maternal Grandmother   . Hypertension Other   . Obesity Other   . Alcohol abuse Father   . Asthma Sister   . Hypertension Sister   . COPD Neg Hx     Patient Active Problem List   Diagnosis Date Noted  . Seasonal allergic rhinitis 06/28/2012  . Obesity, morbid 06/28/2012  . Pink eye 11/15/2011  . HTN (hypertension) 10/12/2011    Past Medical History  Diagnosis Date  . Morbid obesity     BMI 47.9  . Bronchitis     chronic - has flare every December  . Hypertension   . GERD (gastroesophageal reflux disease)     Per MD chart note 12/01/2010  . Persistent headaches     worst during menstrual cycle  . Genital warts   . Environmental allergies   . Anxiety   . Cervical polyp     hx of  . Hx of abnormal Pap smear 2012  . STD (sexually transmitted disease)     hx genital warts/?HSV    Past Surgical History  Procedure Laterality Date  . Wisdom tooth extraction    . Colposcopy  2012    no treatment to cervix--pap smears  reverted to normal    Allergies: Neosporin and Penicillins  Current Outpatient Prescriptions  Medication Sig Dispense Refill  . cetirizine (ZYRTEC) 10 MG tablet Take 1 tablet (10 mg total) by mouth daily.  90 tablet  3  . clarithromycin (BIAXIN XL) 500 MG 24 hr tablet Take 2 tablets (1,000 mg total) by mouth daily.  28 tablet  0  . fluconazole (DIFLUCAN) 150 MG tablet 1 po qd x1 , may repeat in 3 days prn  2 tablet  0  . lisinopril-hydrochlorothiazide (PRINZIDE,ZESTORETIC) 20-12.5 MG per tablet TAKE 2 TABLETS BY MOUTH DAILY  180 tablet  1  . mometasone (NASONEX) 50 MCG/ACT nasal spray Place 2 sprays into the nose daily.  17 g  12  . Multiple Vitamin (MULTIVITAMIN) tablet Take 1 tablet by mouth daily.      Marland Kitchen omeprazole (PRILOSEC) 20 MG capsule Take 1 capsule (20 mg total) by mouth daily.  30 capsule  1   No current facility-administered medications for this visit.    ROS: Pertinent items are noted  in HPI.  Social Hx:  Team leader for a call center.  Exam:    BP 130/78  Pulse 68  Ht 5\' 3"  (1.6 m)  Wt 268 lb (121.564 kg)  BMI 47.49 kg/m2  LMP 10/10/2012   Wt Readings from Last 3 Encounters:  11/08/12 268 lb (121.564 kg)  10/30/12 269 lb 12.8 oz (122.38 kg)  06/27/12 274 lb 6.4 oz (124.467 kg)     Ht Readings from Last 3 Encounters:  11/08/12 5\' 3"  (1.6 m)  01/16/12 5\' 3"  (1.6 m)  10/12/11 5' 4.5" (1.638 m)    General appearance: alert, cooperative and appears stated age Head: Normocephalic, without obvious abnormality, atraumatic Neck: no adenopathy, supple, symmetrical, trachea midline and thyroid not enlarged, symmetric, no tenderness/mass/nodules Lungs: clear to auscultation bilaterally Breasts: Inspection negative, No nipple retraction or dimpling, No nipple discharge or bleeding, No axillary or supraclavicular adenopathy, Normal to palpation without dominant masses Heart: regular rate and rhythm Abdomen: soft, non-tender; bowel sounds normal; no masses,  no organomegaly Extremities: extremities normal, atraumatic, no cyanosis or edema Skin: Skin color, texture, turgor normal. No rashes or lesions Lymph nodes: Cervical, supraclavicular, and axillary nodes normal. No abnormal inguinal nodes palpated Neurologic: Grossly normal   Pelvic: External genitalia:  no lesions.  Covered with Talcum powder.              Urethra:  normal appearing urethra with no masses, tenderness or lesions              Bartholins and Skenes: normal                 Vagina: normal appearing vagina with normal color and discharge, no lesions              Cervix: normal appearance              Pap taken: yes and high risk HPV testing        Bimanual Exam:  Uterus:  uterus is normal size, shape, consistency and nontender                                      Adnexa: normal adnexa in size, nontender and no masses  Assessment    History of endometrial  polyps History of abnormal pap History of HSV - uncertain which type Desire for contraception HTN Obesity  P: mammogram age 66 Do self breast exams pap smear and high risk HPV testing HIV, RPR, Hep C aby, HBsAg, HSV testing, GC/CT from pap Get records regarding ultrasound and endometrial biopsy. Discussed with patient nonestrogen forms of contraception.  Most interested in POP pill or Progesterone IUD. PNV if not using contraception Return in 2 - 3 weeks to discuss prior US and EMB reports and make final decision regarding birth control. TDap today. Do not use talcum powder.  Use corn starch instead. Discussed weight loss in preparation for future pregnancy.  An After Visit Summary was printed and given to the patient.

## 2012-11-12 LAB — HSV(HERPES SIMPLEX VRS) I + II AB-IGG: HSV 2 Glycoprotein G Ab, IgG: 6.15 IV — ABNORMAL HIGH

## 2012-11-12 LAB — IPS PAP TEST WITH HPV

## 2012-11-15 ENCOUNTER — Telehealth: Payer: Self-pay

## 2012-11-15 NOTE — Telephone Encounter (Signed)
Patient notified of abnormal pap and colposcopy appointment made 11-29-12.  Also advised Hep C negative and HSV II positive.

## 2012-11-15 NOTE — Telephone Encounter (Signed)
Message copied by Alphonsa Overall on Thu Nov 15, 2012 12:45 PM ------      Message from: Conley Simmonds      Created: Mon Nov 12, 2012  7:41 PM       Erica Ramos,            [place] labs are coming back one at a time.  Please contact her regarding the following:            -  I just received her pap showing ASCUS and positive high risk HPV.              -  Her HSV type II is positive.            She is also having records send regarding care at there prior gynecology office.  She had a pelvic ultrasound and an endometrial biopsy showing polyps? with Dr. Richardson Dopp.      She had a colposcopy 2 years ago with her PCP and normal paps on follow up.            I am recommending she return for a colposcopy with me.        We also need to discuss her birth control options once I see her records about her endometrial biopsy. ------

## 2012-11-15 NOTE — Telephone Encounter (Signed)
LMOVM Cell# [email or web address] to call to discuss test results.

## 2012-11-21 ENCOUNTER — Telehealth: Payer: Self-pay

## 2012-11-21 NOTE — Telephone Encounter (Signed)
Erica Ramos, I spoke with patient on 11-15-12 and notified needs colposcopy.  Patient is scheduled on 11-29-12.  Please precert. Thanks [employer]

## 2012-11-22 ENCOUNTER — Telehealth: Payer: Self-pay | Admitting: Obstetrics and Gynecology

## 2012-11-22 ENCOUNTER — Ambulatory Visit (INDEPENDENT_AMBULATORY_CARE_PROVIDER_SITE_OTHER): Payer: 59 | Admitting: Obstetrics and Gynecology

## 2012-11-22 DIAGNOSIS — R6889 Other general symptoms and signs: Secondary | ICD-10-CM

## 2012-11-22 NOTE — Progress Notes (Signed)
Patient ID: Erica Ramos, female   DOB: 02-27-1976, 37 y.o.   MRN: [id number]  This is an orders only encounter for a colposcopy for patient's abnormal pap smear 11/08/12 showing ASCUS, positive high risk HPV.

## 2012-11-22 NOTE — Telephone Encounter (Signed)
Patient left message on answering machine today at 7:44 AM canceling appointment for birth control follow up. Patient stated she no longer needed the appointment as she has another appointment 11/22/12 (for a colposcopy).

## 2012-11-23 ENCOUNTER — Telehealth: Payer: Self-pay | Admitting: Obstetrics and Gynecology

## 2012-11-23 NOTE — Telephone Encounter (Signed)
LVM advising copay for colpo appointment.

## 2012-11-29 ENCOUNTER — Encounter: Payer: Self-pay | Admitting: Obstetrics and Gynecology

## 2012-11-29 ENCOUNTER — Telehealth: Payer: Self-pay | Admitting: Obstetrics and Gynecology

## 2012-11-29 ENCOUNTER — Ambulatory Visit (INDEPENDENT_AMBULATORY_CARE_PROVIDER_SITE_OTHER): Payer: 59 | Admitting: Obstetrics and Gynecology

## 2012-11-29 VITALS — BP 150/88 | HR 84 | Ht 63.0 in | Wt 269.5 lb

## 2012-11-29 DIAGNOSIS — F439 Reaction to severe stress, unspecified: Secondary | ICD-10-CM

## 2012-11-29 DIAGNOSIS — B009 Herpesviral infection, unspecified: Secondary | ICD-10-CM

## 2012-11-29 DIAGNOSIS — R6889 Other general symptoms and signs: Secondary | ICD-10-CM

## 2012-11-29 DIAGNOSIS — Z733 Stress, not elsewhere classified: Secondary | ICD-10-CM

## 2012-11-29 DIAGNOSIS — IMO0002 Reserved for concepts with insufficient information to code with codable children: Secondary | ICD-10-CM

## 2012-11-29 DIAGNOSIS — N926 Irregular menstruation, unspecified: Secondary | ICD-10-CM

## 2012-11-29 LAB — POCT URINE PREGNANCY: Preg Test, Ur: NEGATIVE

## 2012-11-29 MED ORDER — VALACYCLOVIR HCL 500 MG PO TABS: 500.0000 mg | ORAL_TABLET | Freq: Two times a day (BID) | ORAL | Status: DC

## 2012-11-29 MED ORDER — VALACYCLOVIR HCL 500 MG PO TABS: ORAL_TABLET | ORAL | Status: DC

## 2012-11-29 NOTE — Telephone Encounter (Signed)
Spoke with pharmacist and notified Valtrex should be 500mg  once daily per Dr. Edward Jolly and Dr. Edward Jolly has corrected  [facility]--pharmacist verified.

## 2012-11-29 NOTE — Progress Notes (Signed)
Subjective:     Patient ID: Erica Ramos, female   DOB: 1975/12/26, 37 y.o.   MRN: [id number]  HPI  LMP 11/08/12 was light.  Patient is here for colposcopy.  Pap 11/09/12 showed ASCUS and positive high risk HPV. Had a colposcopy in 2012 for an abnormal pap.  No treatment.  Follow up paps were normal.  Newly confirmed diagnosis of positive HSV II.  Patient would like suppression with antiviral therapy. Having outbreaks, not month, "but often enough."  Can tell she has an outbreak when she feels a bump.  Review of Systems   Stressed due to multiple family issues:  Uncle very ill, mother diabetic and not caring for herself due to her Brother's illness, sister moving, and patient herself moving to a new home in [place].    Objective:   Physical Exam  Genitourinary:        Colposcopy performed after consent obtained.  Speculum placed in the vagina.  Acetic acid to cervix.  Colposcopy performed and satisfactory.   Biopsy at 11 o'clock and ECC performed.  Monsels placed to cervix.  Minimal EBL.  No complications  Colposcopy of vagina performed after soaking vulva in gauze saturated with acetic acid.  No lesions seen.     UPT prior to procedure - negative Assessment:      Abnormal pap.  HSV II.  Situational Stress.     Plan:      Follow up biopsy results.  Valtrex 500 mg daily.  See Epic orders.  Medication side effects discussed with patient.  Regular condom use.  I discussed compartmentalizing tasks so the patient does not feel overwhelmed in her life at this time.

## 2012-11-29 NOTE — Telephone Encounter (Signed)
[facility] calling wanting direction on Valtrex 500 mg.  Sig request.  Once and twice qd . Not sure which one. Please advise.  Danelle   [phone]

## 2012-12-04 LAB — IPS CERVICAL/ECC/EMB/VULVAR/VAGINAL BIOPSY

## 2012-12-25 ENCOUNTER — Ambulatory Visit (INDEPENDENT_AMBULATORY_CARE_PROVIDER_SITE_OTHER): Payer: 59 | Admitting: Family Medicine

## 2012-12-25 ENCOUNTER — Encounter: Payer: Self-pay | Admitting: Family Medicine

## 2012-12-25 VITALS — BP 112/80 | HR 82 | Temp 98.3°F | Wt 268.0 lb

## 2012-12-25 DIAGNOSIS — R011 Cardiac murmur, unspecified: Secondary | ICD-10-CM

## 2012-12-25 DIAGNOSIS — J329 Chronic sinusitis, unspecified: Secondary | ICD-10-CM

## 2012-12-25 DIAGNOSIS — I10 Essential (primary) hypertension: Secondary | ICD-10-CM

## 2012-12-25 MED ORDER — MOMETASONE FUROATE 50 MCG/ACT NA SUSP: 2.0000 | Freq: Every day | NASAL | Status: DC

## 2012-12-25 NOTE — Progress Notes (Signed)
  Subjective:    Patient here for follow-up of elevated blood pressure.  She is exercising and is adherent to a low-salt diet.  Blood pressure is well controlled at home. Cardiac symptoms: none. Patient denies: chest pain, chest pressure/discomfort, claudication, dyspnea, exertional chest pressure/discomfort, fatigue, irregular heart beat, lower extremity edema, near-syncope, orthopnea, palpitations, paroxysmal nocturnal dyspnea, syncope and tachypnea. Cardiovascular risk factors: hypertension and obesity (BMI >= 30 kg/m2). Use of agents associated with hypertension: none. History of target organ damage: none.  The following portions of the patient's history were reviewed and updated as appropriate: allergies, current medications, past family history, past medical history, past social history, past surgical history and problem list.  Review of Systems Pertinent items are noted in HPI.     Objective:    BP 112/80  Pulse 82  Temp(Src) 98.3 F (36.8 C) (Oral)  Wt 268 lb (121.564 kg)  BMI 47.49 kg/m2  SpO2 99%  LMP 11/08/2012 General appearance: alert, cooperative, appears stated age and no distress Throat: lips, mucosa, and tongue normal; teeth and gums normal Neck: no adenopathy, supple, symmetrical, trachea midline and thyroid not enlarged, symmetric, no tenderness/mass/nodules Lungs: clear to auscultation bilaterally Heart: S1, S2 normal Extremities: extremities normal, atraumatic, no cyanosis or edema    Assessment:    Hypertension, normal blood pressure . Evidence of target organ damage: none.    Plan:    Medication: no change. Dietary sodium restriction. Regular aerobic exercise. Check blood pressures 2-3 times weekly and record. Follow up: 6 months and as needed. or sooner prn

## 2012-12-25 NOTE — Patient Instructions (Addendum)
Heart Murmur  A heart murmur is an extra sound heard by your caregiver when listening to your heart with a device called a stethoscope. The sound might be a "hum" or "whoosh" sound heard when the heart beats. The sound comes from turbulence when blood flows through the heart. There are two types of heart murmurs:   Innocent (Harmless) murmurs: Most people with this type of heart murmur do not have signs or symptoms of heart problems. Many children have innocent heart murmurs. When an innocent heart murmur is found, there is no need to get tests or do treatment. Also, there is no need to restrict activities or stop playing sports. Innocent heart murmurs may be caused by many things. For example, it might be caused by a tiny hole or defect in the wall of the heart. These defects often close as a child grows. An innocent heart murmur may be heard by an examining clinician throughout your life. If you see a new caregiver, please let him or her know this was found during past exams.   Abnormal murmurs: May have signs and symptoms of heart problems. These types of murmurs can occur in children and adults. In children, abnormal heart murmurs are typically caused from heart defects that are present at birth. In adults, abnormal murmurs are usually from heart valve problems caused by disease, infection, or aging.  SYMPTOMS    Innocent (Harmless) murmurs do not cause symptoms or require you to limit physical activity.   Many people with abnormal murmurs may or may not have symptoms. If symptoms do develop, they might include:   Shortness of breath.   Blue coloring of the skin, especially on the fingertips.   Chest pain.   Palpitations or feeling a "fluttering" or a "skipped" heart beat.   Fainting.   Persistent cough.   Getting tired much faster than expected.  DIAGNOSIS   A heart murmur might be heard during a pre-sports physical or during any type of examination. When a murmur is heard, it may suggest a possible  problem. When this happens, your caregiver may ask you to see a heart specialist (cardiologist). You may also be asked to undergo one or more heart tests. In these cases, testing may vary depending upon what your caregiver heard. Tests for a heart murmur might include one or more of the following:   EKG (electrocardiogram).   Echocardiogram.   Cardiac MRI.  For children and adults who have an abnormal heart murmur and want to play sports, it is important to complete testing, review test results, and receive recommendations from your caregiver. If heart disease is present, it may be risky to play.  Finding out the results of your test  Not all test results are available during your visit. If your test results are not back during the visit, make an appointment with your caregiver to find out the results. Do not assume everything is normal if you have not heard from your caregiver or the medical facility. It is important for you to follow up on all of your test results.   TREATMENT   As noted above, innocent (harmless) murmurs require no treatment or activity restriction. If the murmur represents a problem with the heart, treatment will depend upon the exact nature of the problem. In these cases, medicine or surgery may be needed to treat the problem.  HOME CARE INSTRUCTIONS  If you want to participate in sports or other types of strenuous physical activity, it is important to   discuss this first with your caregiver. If the murmur represents a problem with the heart and you choose to participate in sports, there is a small chance that a serious problem (including sudden death) could result.   SEEK MEDICAL CARE IF:    You feel that your symptoms are slowly worsening.   You develop any new symptoms that cause concern.   You feel that you are having side effects from any medicines prescribed.  SEEK IMMEDIATE MEDICAL CARE IF:    Chest pain develops.   You are short of breath.   You notice that your heart beats  irregularly often enough to cause you to worry.   You have fainting spells.   There is a worsening of any problems that brought you or your child in for medical care.  Document Released: 05/26/2004 Document Revised: 07/11/2011 Document Reviewed: 06/26/2007  Exit[employer] Patient Information 2014 [employer].

## 2012-12-26 ENCOUNTER — Other Ambulatory Visit (INDEPENDENT_AMBULATORY_CARE_PROVIDER_SITE_OTHER): Payer: 59

## 2012-12-26 DIAGNOSIS — I10 Essential (primary) hypertension: Secondary | ICD-10-CM

## 2012-12-26 LAB — POCT URINALYSIS DIPSTICK
Bilirubin, UA: NEGATIVE
Glucose, UA: NEGATIVE
Ketones, UA: NEGATIVE
Leukocytes, UA: NEGATIVE
Nitrite, UA: NEGATIVE
pH, UA: 6.5

## 2012-12-27 LAB — HEPATIC FUNCTION PANEL
Bilirubin, Direct: 0 mg/dL (ref 0.0–0.3)
Total Bilirubin: 0.3 mg/dL (ref 0.3–1.2)
Total Protein: 6.4 g/dL (ref 6.0–8.3)

## 2012-12-27 LAB — BASIC METABOLIC PANEL
BUN: 14 mg/dL (ref 6–23)
Chloride: 105 mEq/L (ref 96–112)
Potassium: 4 mEq/L (ref 3.5–5.1)
Sodium: 138 mEq/L (ref 135–145)

## 2012-12-27 LAB — LIPID PANEL
Cholesterol: 145 mg/dL (ref 0–200)
HDL: 43.6 mg/dL (ref >39.00)
LDL Cholesterol: 86 mg/dL (ref 0–99)
VLDL: 15.2 mg/dL (ref 0.0–40.0)

## 2013-01-09 ENCOUNTER — Other Ambulatory Visit (HOSPITAL_COMMUNITY): Payer: 59

## 2013-01-22 ENCOUNTER — Other Ambulatory Visit (HOSPITAL_COMMUNITY): Payer: 59

## 2013-01-23 ENCOUNTER — Encounter (HOSPITAL_COMMUNITY): Payer: Self-pay | Admitting: Family Medicine

## 2013-05-01 ENCOUNTER — Telehealth: Payer: Self-pay | Admitting: Family Medicine

## 2013-05-01 ENCOUNTER — Encounter: Payer: Self-pay | Admitting: Family Medicine

## 2013-05-01 ENCOUNTER — Ambulatory Visit (INDEPENDENT_AMBULATORY_CARE_PROVIDER_SITE_OTHER): Payer: 59 | Admitting: Family Medicine

## 2013-05-01 VITALS — BP 140/90 | HR 95 | Temp 98.8°F | Wt 269.0 lb

## 2013-05-01 DIAGNOSIS — R079 Chest pain, unspecified: Secondary | ICD-10-CM

## 2013-05-01 DIAGNOSIS — F419 Anxiety disorder, unspecified: Secondary | ICD-10-CM

## 2013-05-01 DIAGNOSIS — F430 Acute stress reaction: Secondary | ICD-10-CM

## 2013-05-01 DIAGNOSIS — F411 Generalized anxiety disorder: Secondary | ICD-10-CM

## 2013-05-01 DIAGNOSIS — L02412 Cutaneous abscess of left axilla: Secondary | ICD-10-CM | POA: Insufficient documentation

## 2013-05-01 DIAGNOSIS — IMO0002 Reserved for concepts with insufficient information to code with codable children: Secondary | ICD-10-CM

## 2013-05-01 HISTORY — DX: Chest pain, unspecified: R07.9

## 2013-05-01 HISTORY — DX: Cutaneous abscess of left axilla: L02.412

## 2013-05-01 MED ORDER — FLUCONAZOLE 150 MG PO TABS: 150.0000 mg | ORAL_TABLET | Freq: Once | ORAL | Status: DC

## 2013-05-01 MED ORDER — DOXYCYCLINE HYCLATE 100 MG PO TABS: 100.0000 mg | ORAL_TABLET | Freq: Two times a day (BID) | ORAL | Status: DC

## 2013-05-01 NOTE — Assessment & Plan Note (Signed)
ekg normal Probably from anxiety

## 2013-05-01 NOTE — Telephone Encounter (Signed)
MSG left advising Rx sent.      KP 

## 2013-05-01 NOTE — Assessment & Plan Note (Signed)
abx  Warm compresses Refer to surgeon

## 2013-05-01 NOTE — Progress Notes (Signed)
Pre visit review using our clinic review tool, if applicable. No additional management support is needed unless otherwise documented below in the visit note. 

## 2013-05-01 NOTE — Telephone Encounter (Signed)
Patient had antibiotic rx'd today at [facility]. She came to checkout and states that she forgot to mention that antibiotics sometimes cause her to get yeast infections and is asking that another rx be sent to pharmacy for this just in case.

## 2013-05-01 NOTE — Assessment & Plan Note (Signed)
Pt refused meds Refer to psych for counseling

## 2013-05-01 NOTE — Patient Instructions (Signed)
Abscess An abscess is an infected area that contains a collection of pus and debris.It can occur in almost any part of the body. An abscess is also known as a furuncle or boil. CAUSES  An abscess occurs when tissue gets infected. This can occur from blockage of oil or sweat glands, infection of hair follicles, or a minor injury to the skin. As the body tries to fight the infection, pus collects in the area and creates pressure under the skin. This pressure causes pain. People with weakened immune systems have difficulty fighting infections and get certain abscesses more often.  SYMPTOMS Usually an abscess develops on the skin and becomes a painful mass that is red, warm, and tender. If the abscess forms under the skin, you may feel a moveable soft area under the skin. Some abscesses break open (rupture) on their own, but most will continue to get worse without care. The infection can spread deeper into the body and eventually into the bloodstream, causing you to feel ill.  DIAGNOSIS  Your caregiver will take your medical history and perform a physical exam. A sample of fluid may also be taken from the abscess to determine what is causing your infection. TREATMENT  Your caregiver may prescribe antibiotic medicines to fight the infection. However, taking antibiotics alone usually does not cure an abscess. Your caregiver may need to make a small cut (incision) in the abscess to drain the pus. In some cases, gauze is packed into the abscess to reduce pain and to continue draining the area. HOME CARE INSTRUCTIONS   Only take over-the-counter or prescription medicines for pain, discomfort, or fever as directed by your caregiver.  If you were prescribed antibiotics, take them as directed. Finish them even if you start to feel better.  If gauze is used, follow your caregiver's directions for changing the gauze.  To avoid spreading the infection:  Keep your draining abscess covered with a  bandage.  Wash your hands well.  Do not share personal care items, towels, or whirlpools with others.  Avoid skin contact with others.  Keep your skin and clothes clean around the abscess.  Keep all follow-up appointments as directed by your caregiver. SEEK MEDICAL CARE IF:   You have increased pain, swelling, redness, fluid drainage, or bleeding.  You have muscle aches, chills, or a general ill feeling.  You have a fever. MAKE SURE YOU:   Understand these instructions.  Will watch your condition.  Will get help right away if you are not doing well or get worse. Document Released: 01/26/2005 Document Revised: 10/18/2011 Document Reviewed: 07/01/2011 Exit[employer] Patient Information 2014 [employer].  

## 2013-05-01 NOTE — Progress Notes (Signed)
   Subjective:    Patient ID: Erica Ramos, female    DOB: [birth year], 37 y.o.   MRN: [id number]  HPI Pt here c/o abscess in L axilla--- it has been there a long time and is not healing.  It does drain.  Then patient c/o L sided chest pain, with no radiation.  Occurs a lot at night.  Pt than started crying.  + stress at home and at work.    Review of Systems As above     Objective:   Physical Exam  BP 140/90  Pulse 95  Temp(Src) 98.8 F (37.1 C) (Oral)  Wt 269 lb (122.018 kg)  SpO2 96% General appearance: alert, cooperative, appears stated age and no distress Neck: no adenopathy, supple, symmetrical, trachea midline and thyroid not enlarged, symmetric, no tenderness/mass/nodules Lungs: clear to auscultation bilaterally Heart: S1, S2 normal Skin: axilla + abscess Psych-- + anxious,  Crying-- not suicidal,  Stressed about things going on at work, home etc.       Assessment & Plan:

## 2013-05-07 ENCOUNTER — Ambulatory Visit (INDEPENDENT_AMBULATORY_CARE_PROVIDER_SITE_OTHER): Payer: Self-pay | Admitting: Surgery

## 2013-05-17 ENCOUNTER — Ambulatory Visit (INDEPENDENT_AMBULATORY_CARE_PROVIDER_SITE_OTHER): Payer: 59 | Admitting: Surgery

## 2013-05-27 ENCOUNTER — Encounter (INDEPENDENT_AMBULATORY_CARE_PROVIDER_SITE_OTHER): Payer: Self-pay | Admitting: Surgery

## 2013-06-17 ENCOUNTER — Ambulatory Visit (INDEPENDENT_AMBULATORY_CARE_PROVIDER_SITE_OTHER): Payer: 59 | Admitting: Surgery

## 2013-06-19 ENCOUNTER — Telehealth (INDEPENDENT_AMBULATORY_CARE_PROVIDER_SITE_OTHER): Payer: Self-pay

## 2013-06-19 NOTE — Telephone Encounter (Signed)
LMOM stating that I was r/s the appt with Dr Michaell CowingGross from 2/26 to Dr Derrell Lollingamirez 2/27 due Dr Michaell CowingGross notifying me that he does not want to see any pt's with axillary or breast problems unless if it's on urgent office.

## 2013-06-27 ENCOUNTER — Ambulatory Visit (INDEPENDENT_AMBULATORY_CARE_PROVIDER_SITE_OTHER): Payer: 59 | Admitting: Surgery

## 2013-06-28 ENCOUNTER — Ambulatory Visit (INDEPENDENT_AMBULATORY_CARE_PROVIDER_SITE_OTHER): Payer: 59 | Admitting: General Surgery

## 2013-06-28 ENCOUNTER — Ambulatory Visit: Payer: 59 | Admitting: Family Medicine

## 2013-07-03 ENCOUNTER — Ambulatory Visit (INDEPENDENT_AMBULATORY_CARE_PROVIDER_SITE_OTHER): Payer: 59 | Admitting: General Surgery

## 2013-07-03 ENCOUNTER — Telehealth (INDEPENDENT_AMBULATORY_CARE_PROVIDER_SITE_OTHER): Payer: Self-pay

## 2013-07-03 NOTE — Telephone Encounter (Signed)
Called and left message for patient to call our office.  I need to speak with patient regarding appointment this afternoon.

## 2013-07-05 ENCOUNTER — Ambulatory Visit (INDEPENDENT_AMBULATORY_CARE_PROVIDER_SITE_OTHER): Payer: 59 | Admitting: Family Medicine

## 2013-07-05 ENCOUNTER — Encounter: Payer: Self-pay | Admitting: Family Medicine

## 2013-07-05 VITALS — BP 124/80 | HR 94 | Temp 98.5°F | Wt 265.0 lb

## 2013-07-05 DIAGNOSIS — J209 Acute bronchitis, unspecified: Secondary | ICD-10-CM

## 2013-07-05 DIAGNOSIS — E669 Obesity, unspecified: Secondary | ICD-10-CM | POA: Insufficient documentation

## 2013-07-05 DIAGNOSIS — I10 Essential (primary) hypertension: Secondary | ICD-10-CM

## 2013-07-05 MED ORDER — LISINOPRIL-HYDROCHLOROTHIAZIDE 20-12.5 MG PO TABS: ORAL_TABLET | ORAL | Status: DC

## 2013-07-05 MED ORDER — AZITHROMYCIN 250 MG PO TABS: ORAL_TABLET | ORAL | Status: DC

## 2013-07-05 NOTE — Progress Notes (Signed)
Pre visit review using our clinic review tool, if applicable. No additional management support is needed unless otherwise documented below in the visit note. 

## 2013-07-05 NOTE — Progress Notes (Signed)
  Subjective:     Erica EhrichChandra D Zirkle is a 38 y.o. female here for evaluation of a cough. Onset of symptoms was 10 days ago. Symptoms have been gradually worsening since that time. The cough is productive and is aggravated by cold air, infection and reclining position. Associated symptoms include: postnasal drip, shortness of breath and sputum production. Patient does not have a history of asthma. Patient does have a history of environmental allergens. Patient has not traveled recently. Patient does not have a history of smoking. Patient has not had a previous chest x-ray. Patient has not had a PPD done.  The following portions of the patient's history were reviewed and updated as appropriate: allergies, current medications, past family history, past medical history, past social history, past surgical history and problem list.  Review of Systems Pertinent items are noted in HPI.    Objective:    Oxygen saturation 96% on room air BP 124/80  Pulse 94  Temp(Src) 98.5 F (36.9 C) (Oral)  Wt 265 lb (120.203 kg)  SpO2 96% General appearance: alert, cooperative, appears stated age and no distress Ears: normal TM's and external ear canals both ears Nose: Nares normal. Septum midline. Mucosa normal. No drainage or sinus tenderness. Throat: lips, mucosa, and tongue normal; teeth and gums normal Neck: mild anterior cervical adenopathy, supple, symmetrical, trachea midline and thyroid not enlarged, symmetric, no tenderness/mass/nodules Lungs: rhonchi LLL Heart: S1, S2 normal Extremities: extremities normal, atraumatic, no cyanosis or edema Lymph nodes: Cervical, supraclavicular, and axillary nodes normal.    Assessment:    Acute Bronchitis    Plan:    Antibiotics per medication orders. Antitussives per medication orders. Avoid exposure to tobacco smoke and fumes. B-agonist inhaler. Call if shortness of breath worsens, blood in sputum, change in character of cough, development of fever or  chills, inability to maintain nutrition and hydration. Avoid exposure to tobacco smoke and fumes. Trial of steroid nasal spray. con't antihistamine

## 2013-07-05 NOTE — Patient Instructions (Signed)

## 2013-07-06 ENCOUNTER — Telehealth: Payer: Self-pay | Admitting: Family Medicine

## 2013-07-06 NOTE — Telephone Encounter (Signed)
Relevant patient education assigned to patient using Emmi. ° °

## 2013-07-08 ENCOUNTER — Telehealth: Payer: Self-pay | Admitting: Family Medicine

## 2013-07-08 NOTE — Telephone Encounter (Signed)
Relevant patient education assigned to patient using Emmi. ° °

## 2013-07-15 ENCOUNTER — Encounter (INDEPENDENT_AMBULATORY_CARE_PROVIDER_SITE_OTHER): Payer: Self-pay | Admitting: General Surgery

## 2013-07-15 ENCOUNTER — Ambulatory Visit (INDEPENDENT_AMBULATORY_CARE_PROVIDER_SITE_OTHER): Payer: 59 | Admitting: General Surgery

## 2013-07-15 VITALS — BP 122/80 | HR 80 | Temp 98.2°F | Resp 12 | Ht 63.0 in | Wt 265.0 lb

## 2013-07-15 DIAGNOSIS — L02419 Cutaneous abscess of limb, unspecified: Secondary | ICD-10-CM

## 2013-07-15 DIAGNOSIS — IMO0002 Reserved for concepts with insufficient information to code with codable children: Secondary | ICD-10-CM

## 2013-07-15 NOTE — Progress Notes (Signed)
Subjective:     Patient ID: Erica Ramos, female   DOB: 01-21-1976, 38 y.o.   MRN: [id number]  HPI The patient is a 38 year old female who is referred by Dr. Laury AxonLowne for evaluation of axillary abscess. the patient states she has had multiple occurrences of axillary abscesses. She states she she previously tried antibiotics prescribed by her PCP however was not successful. She states that the drain or cough. She states they become more noticeable when she does a lot of sweating. She states that usually self rupture.  Review of Systems  Constitutional: Negative.   HENT: Negative.   Respiratory: Negative.   Cardiovascular: Negative.   Gastrointestinal: Negative.   Neurological: Negative.   All other systems reviewed and are negative.       Objective:   Physical Exam  Constitutional: She is oriented to person, place, and time. She appears well-developed and well-nourished.  HENT:  Head: Normocephalic and atraumatic.  Eyes: Conjunctivae and EOM are normal. Pupils are equal, round, and reactive to light.  Neck: Normal range of motion. Neck supple.  Cardiovascular: Normal rate, regular rhythm and normal heart sounds.   Pulmonary/Chest: Effort normal and breath sounds normal.  Abdominal: Soft. Bowel sounds are normal. She exhibits no distension and no mass. There is no tenderness. There is no rebound and no guarding.  Musculoskeletal: Normal range of motion.  Neurological: She is alert and oriented to person, place, and time.  Skin: Skin is warm and dry.     Psychiatric: She has a normal mood and affect.       Assessment:     38 year old female with recurrent axillary abscesses, I do not think this hidradenitis.     Plan:      1. I discussed with herto begin using antipersperant.  We'll also discuss it in a warm compress to help with inflammation she first noticed them. 2. I also think potentially covered for MRSA at the time of initial abscess can potentially help was treating  the recurrence. 3. I do not believe surgical treatment is indicated at this time.

## 2013-08-07 ENCOUNTER — Emergency Department (HOSPITAL_COMMUNITY)
Admission: EM | Admit: 2013-08-07 | Discharge: 2013-08-07 | Disposition: A | Discharge status: Home / Self Care | Payer: 59 | Source: Home / Self Care

## 2013-08-07 ENCOUNTER — Encounter (HOSPITAL_COMMUNITY): Payer: Self-pay | Admitting: Emergency Medicine

## 2013-08-07 DIAGNOSIS — A088 Other specified intestinal infections: Secondary | ICD-10-CM

## 2013-08-07 DIAGNOSIS — A084 Viral intestinal infection, unspecified: Secondary | ICD-10-CM

## 2013-08-07 DIAGNOSIS — G5700 Lesion of sciatic nerve, unspecified lower limb: Secondary | ICD-10-CM

## 2013-08-07 DIAGNOSIS — G5701 Lesion of sciatic nerve, right lower limb: Secondary | ICD-10-CM

## 2013-08-07 LAB — POCT URINALYSIS DIP (DEVICE)
BILIRUBIN URINE: NEGATIVE
Glucose, UA: NEGATIVE mg/dL
HGB URINE DIPSTICK: NEGATIVE
Ketones, ur: NEGATIVE mg/dL
Leukocytes, UA: NEGATIVE
Nitrite: NEGATIVE
Protein, ur: NEGATIVE mg/dL
Specific Gravity, Urine: 1.01 (ref 1.005–1.030)
Urobilinogen, UA: 0.2 mg/dL (ref 0.0–1.0)
pH: 7 (ref 5.0–8.0)

## 2013-08-07 LAB — POCT PREGNANCY, URINE: Preg Test, Ur: NEGATIVE

## 2013-08-07 MED ORDER — ONDANSETRON 4 MG PO TBDP: 4.0000 mg | ORAL_TABLET | Freq: Three times a day (TID) | ORAL | Status: DC | PRN

## 2013-08-07 MED ORDER — HYDROCODONE-ACETAMINOPHEN 5-325 MG PO TABS: 1.0000 | ORAL_TABLET | Freq: Four times a day (QID) | ORAL | Status: DC | PRN

## 2013-08-07 MED ORDER — CYCLOBENZAPRINE HCL 10 MG PO TABS: 10.0000 mg | ORAL_TABLET | Freq: Three times a day (TID) | ORAL | Status: DC | PRN

## 2013-08-07 NOTE — ED Notes (Signed)
Pt c/o abd/back/b hip/leg pain onset yest Reports pain is intermittent but today they've become more steadily constant Denies inj/trauma, f/v/n/d, urinary sxs, gyn sxs She thought it was her typical menstrual pains Alert w/no signs of acute distress.

## 2013-08-07 NOTE — ED Provider Notes (Signed)
CSN: [id number]     Arrival date & time 08/07/13  1624 History   None    Chief Complaint  Patient presents with  . Abdominal Pain  . Generalized Body Aches   (Consider location/radiation/quality/duration/timing/severity/associated sxs/prior Treatment) HPI  She is here today for back/butt/hip/leg pain and abdominal pain with subjective fever.  Back/hip pain: States this started a little 2 weeks ago just before starting her menses.  Yesterday, it got worse with sharp pains in her low back (worse on the right) and her right buttock down into her right leg.  This pain has become more constant today.  Pain travels to about her knee.  No bowel or bladder incontinence.  No numbness or tingling.  The leg does feels weak, but thinks this is due to the pain.  Abd pain: This has been present yesterday and today.  Described chills and subjective fever today.  Abdominal pain is sharp more than crampy, mostly lower abdominal.  She has had some nausea.  Tolerating fluids well.  No vomiting.  No diarrhea.  Denies any vaginal discharge or discomfort.  No dysuria or urinary symptoms.  States co-workers have been sick with the stomach flu in the last 2 weeks.  Past Medical History  Diagnosis Date  . Morbid obesity     BMI 47.9  . Bronchitis     chronic - has flare every December  . Hypertension   . GERD (gastroesophageal reflux disease)     Per MD chart note 12/01/2010  . Persistent headaches     worst during menstrual cycle  . Genital warts   . Environmental allergies   . Anxiety   . Cervical polyp     hx of  . Hx of abnormal Pap smear 2012  . STD (sexually transmitted disease)     hx genital warts/?HSV   Past Surgical History  Procedure Laterality Date  . Wisdom tooth extraction    . Colposcopy  2012    no treatment to cervix--pap smears reverted to normal   Family History  Problem Relation Age of Onset  . Diabetes Mother   . Hypertension Mother     Under control  . Diabetes Maternal Aunt    . Seizures Maternal Aunt   . Diabetes Maternal Uncle   . Diabetes Maternal Grandmother   . Hypertension Maternal Grandmother   . Hypertension Other   . Obesity Other   . Alcohol abuse Father   . Asthma Sister   . Hypertension Sister   . COPD Neg Hx    History  Substance Use Topics  . Smoking status: Never Smoker   . Smokeless tobacco: Never Used  . Alcohol Use: Yes     Comment: Occasionally; less than 1-2 a week   OB History   Grav Para Term Preterm Abortions TAB SAB Ect Mult Living   1    1  1         Review of Systems  Constitutional: Positive for fever, chills and appetite change.  HENT: Negative.   Eyes: Negative.   Respiratory: Negative.   Cardiovascular: Negative.   Gastrointestinal: Positive for nausea and abdominal pain. Negative for vomiting and diarrhea.  Genitourinary: Negative.   Musculoskeletal: Positive for back pain.       Right buttock pain into hip and lateral thigh  Skin: Negative.   Neurological: Negative.     Allergies  Neosporin and Penicillins  Home Medications   Current Outpatient Rx  Name  Route  Sig  Dispense  Refill  . cetirizine (ZYRTEC) 10 MG tablet   Oral   Take 1 tablet (10 mg total) by mouth daily.   90 tablet   3   . lisinopril-hydrochlorothiazide (PRINZIDE,ZESTORETIC) 20-12.5 MG per tablet      TAKE 2 TABLETS BY MOUTH DAILY   60 tablet   0   . cyclobenzaprine (FLEXERIL) 10 MG tablet   Oral   Take 1 tablet (10 mg total) by mouth 3 (three) times daily as needed for muscle spasms.   30 tablet   0   . HYDROcodone-acetaminophen (NORCO) 5-325 MG per tablet   Oral   Take 1 tablet by mouth every 6 (six) hours as needed for moderate pain.   30 tablet   0   . mometasone (NASONEX) 50 MCG/ACT nasal spray   Nasal   Place 2 sprays into the nose daily.   17 g   12   . Multiple Vitamin (MULTIVITAMIN) tablet   Oral   Take 1 tablet by mouth daily.         Marland Kitchen omeprazole (PRILOSEC) 20 MG capsule   Oral   Take 1 capsule  (20 mg total) by mouth daily.   30 capsule   1   . ondansetron (ZOFRAN ODT) 4 MG disintegrating tablet   Oral   Take 1 tablet (4 mg total) by mouth every 8 (eight) hours as needed for nausea or vomiting.   20 tablet   0   . valACYclovir (VALTREX) 500 MG tablet      Take 1 tablet (500 mg) once daily.   30 tablet   12    BP 121/71  Pulse 110  Temp(Src) 99.1 F (37.3 C) (Oral)  Resp 21  SpO2 95%  LMP 07/31/2013 Physical Exam  Constitutional: She is oriented to person, place, and time. She appears well-developed and well-nourished. She appears distressed.  HENT:  Head: Normocephalic and atraumatic.  Neck: Normal range of motion. Neck supple.  Cardiovascular: Regular rhythm and normal heart sounds.   No murmur heard. Mild tachycardia  Pulmonary/Chest: Effort normal and breath sounds normal. No respiratory distress. She has no wheezes. She has no rales.  Abdominal: Soft. Bowel sounds are normal. She exhibits no mass. There is tenderness (mild diffuse). There is no rebound and no guarding.  Musculoskeletal:  Back: no erythema or edema; no vertebral step offs; no vertebral tenderness; mild lumbar paraspinious tenderness without spasm; very tender over right piriformis; SLR negative bilaterally  Neurological: She is alert and oriented to person, place, and time. She exhibits normal muscle tone.  Skin: Skin is warm and dry. No rash noted. She is not diaphoretic.  Psychiatric: Her behavior is normal. Judgment and thought content normal.    ED Course  Procedures (including critical care time) Labs Review Labs Reviewed  POCT URINALYSIS DIP (DEVICE)  POCT PREGNANCY, URINE   Imaging Review No results found.   MDM   1. Viral gastroenteritis   2. Piriformis syndrome of right side    Abdominal discomfort, fevers, nausea and exposure point to viral gastroenteritis. UA and Urine preg negative. Recommended plenty of fluids. Zofran prn for nausea. Discussed likely time  course. Work note through Monday.  Her pain seems to be more in the right buttock and around the hip to the lateral thigh. This seems most consistent with piriformis syndrome. No vertebral pain or bowel/bladder incontinence. Discussed ibuprofen if she is able to tolerate it. Flexeril and norco provided for pain relief.  She has f/u with her  PCP on Friday.     Charm RingsErin J Honig, MD 08/07/13 [id number]

## 2013-08-07 NOTE — ED Provider Notes (Signed)
Medical screening examination/treatment/procedure(s) were performed by resident physician or non-physician practitioner and as supervising physician I was immediately available for consultation/collaboration.   KINDL,JAMES DOUGLAS MD.   James D Kindl, MD 08/07/13 2012 

## 2013-08-07 NOTE — Discharge Instructions (Signed)
You likely got the stomach flu. You will probably get some diarrhea in the next few days. Take zofran as needed for nausea. Concentrate on drinking plenty of fluids.  You also likely have piriformis syndrome.  This is were a muscle in your butt is tight and irritated. Take flexeril 3 times a day as needed.  It will likely make you tired. Use Norco as needed for pain.  Follow up with your doctor as scheduled on Friday.

## 2013-08-09 ENCOUNTER — Ambulatory Visit (INDEPENDENT_AMBULATORY_CARE_PROVIDER_SITE_OTHER): Payer: 59 | Admitting: Family Medicine

## 2013-08-09 ENCOUNTER — Encounter: Payer: Self-pay | Admitting: Family Medicine

## 2013-08-09 ENCOUNTER — Ambulatory Visit (INDEPENDENT_AMBULATORY_CARE_PROVIDER_SITE_OTHER)
Admission: RE | Admit: 2013-08-09 | Discharge: 2013-08-09 | Disposition: A | Discharge status: Home / Self Care | Payer: 59 | Source: Ambulatory Visit | Attending: Family Medicine | Admitting: Family Medicine

## 2013-08-09 VITALS — BP 100/62 | HR 102 | Temp 98.6°F | Wt 263.0 lb

## 2013-08-09 DIAGNOSIS — M549 Dorsalgia, unspecified: Secondary | ICD-10-CM

## 2013-08-09 LAB — POCT URINALYSIS DIPSTICK
BILIRUBIN UA: NEGATIVE
Blood, UA: NEGATIVE
Glucose, UA: NEGATIVE
KETONES UA: NEGATIVE
LEUKOCYTES UA: NEGATIVE
Nitrite, UA: NEGATIVE
PROTEIN UA: NEGATIVE
Spec Grav, UA: <=1.005
Urobilinogen, UA: 0.2
pH, UA: 6

## 2013-08-09 NOTE — Progress Notes (Signed)
  Subjective:    Erica EhrichChandra D Colbaugh is a 38 y.o. female who presents for evaluation of low back pain. The patient has had no prior back problems. Symptoms have been present for 2 weeks and are gradually worsening.  Onset was related to / precipitated by no known injury. The pain is located in the right lumbar area and radiates to the right thigh. The pain is described as aching and occurs intermittently. She rates her pain as a 10 on a scale of 0-10. Symptoms are exacerbated by extension, sitting, standing and walking. Symptoms are improved by heat and rest. She has also tried nothing which provided no symptom relief. She has no other symptoms associated with the back pain. The patient has no "red flag" history indicative of complicated back pain.  The following portions of the patient's history were reviewed and updated as appropriate: allergies, current medications, past family history, past medical history, past social history, past surgical history and problem list.  Review of Systems Pertinent items are noted in HPI.    Objective:   Full range of motion without pain, no tenderness, no spasm, no curvature. Normal reflexes, gait, strength and negative straight-leg raise.    Assessment:    Nonspecific acute low back pain    Plan:    Short (2-4 day) period of relative rest recommended until acute symptoms improve. Ice to affected area as needed for local pain relief. Heat to affected area as needed for local pain relief. Follow-up in 2 weeks.  Pt has muscle relaxer and norco from [facility]

## 2013-08-09 NOTE — Patient Instructions (Signed)
Back Pain, Adult Low back pain is very common. About 1 in 5 people have back pain.The cause of low back pain is rarely dangerous. The pain often gets better over time.About half of people with a sudden onset of back pain feel better in just 2 weeks. About 8 in 10 people feel better by 6 weeks.  CAUSES Some common causes of back pain include:  Strain of the muscles or ligaments supporting the spine.  Wear and tear (degeneration) of the spinal discs.  Arthritis.  Direct injury to the back. DIAGNOSIS Most of the time, the direct cause of low back pain is not known.However, back pain can be treated effectively even when the exact cause of the pain is unknown.Answering your caregiver's questions about your overall health and symptoms is one of the most accurate ways to make sure the cause of your pain is not dangerous. If your caregiver needs more information, he or she may order lab work or imaging tests (X-rays or MRIs).However, even if imaging tests show changes in your back, this usually does not require surgery. HOME CARE INSTRUCTIONS For many people, back pain returns.Since low back pain is rarely dangerous, it is often a condition that people can learn to manageon their own.   Remain active. It is stressful on the back to sit or stand in one place. Do not sit, drive, or stand in one place for more than 30 minutes at a time. Take short walks on level surfaces as soon as pain allows.Try to increase the length of time you walk each day.  Do not stay in bed.Resting more than 1 or 2 days can delay your recovery.  Do not avoid exercise or work.Your body is made to move.It is not dangerous to be active, even though your back may hurt.Your back will likely heal faster if you return to being active before your pain is gone.  Pay attention to your body when you bend and lift. Many people have less discomfortwhen lifting if they bend their knees, keep the load close to their bodies,and  avoid twisting. Often, the most comfortable positions are those that put less stress on your recovering back.  Find a comfortable position to sleep. Use a firm mattress and lie on your side with your knees slightly bent. If you lie on your back, put a pillow under your knees.  Only take over-the-counter or prescription medicines as directed by your caregiver. Over-the-counter medicines to reduce pain and inflammation are often the most helpful.Your caregiver may prescribe muscle relaxant drugs.These medicines help dull your pain so you can more quickly return to your normal activities and healthy exercise.  Put ice on the injured area.  Put ice in a plastic bag.  Place a towel between your skin and the bag.  Leave the ice on for 15-20 minutes, 03-04 times a day for the first 2 to 3 days. After that, ice and heat may be alternated to reduce pain and spasms.  Ask your caregiver about trying back exercises and gentle massage. This may be of some benefit.  Avoid feeling anxious or stressed.Stress increases muscle tension and can worsen back pain.It is important to recognize when you are anxious or stressed and learn ways to manage it.Exercise is a great option. SEEK MEDICAL CARE IF:  You have pain that is not relieved with rest or medicine.  You have pain that does not improve in 1 week.  You have new symptoms.  You are generally not feeling well. SEEK   IMMEDIATE MEDICAL CARE IF:   You have pain that radiates from your back into your legs.  You develop new bowel or bladder control problems.  You have unusual weakness or numbness in your arms or legs.  You develop nausea or vomiting.  You develop abdominal pain.  You feel faint. Document Released: 04/18/2005 Document Revised: 10/18/2011 Document Reviewed: 09/06/2010 Exit[employer] Patient Information 2014 [employer].  

## 2013-08-09 NOTE — Progress Notes (Signed)
Pre visit review using our clinic review tool, if applicable. No additional management support is needed unless otherwise documented below in the visit note. 

## 2013-08-12 ENCOUNTER — Other Ambulatory Visit: Payer: Self-pay

## 2013-08-12 DIAGNOSIS — M549 Dorsalgia, unspecified: Secondary | ICD-10-CM

## 2013-08-12 DIAGNOSIS — M419 Scoliosis, unspecified: Secondary | ICD-10-CM

## 2013-08-14 ENCOUNTER — Encounter: Payer: Self-pay | Admitting: Obstetrics and Gynecology

## 2013-10-09 ENCOUNTER — Other Ambulatory Visit: Payer: Self-pay | Admitting: Family Medicine

## 2013-11-22 ENCOUNTER — Ambulatory Visit: Payer: 59 | Admitting: Obstetrics and Gynecology

## 2013-11-29 ENCOUNTER — Ambulatory Visit: Payer: 59 | Admitting: Obstetrics and Gynecology

## 2013-11-29 ENCOUNTER — Encounter: Payer: Self-pay | Admitting: Obstetrics and Gynecology

## 2013-12-20 ENCOUNTER — Ambulatory Visit (INDEPENDENT_AMBULATORY_CARE_PROVIDER_SITE_OTHER): Payer: 59 | Admitting: Obstetrics and Gynecology

## 2013-12-20 ENCOUNTER — Encounter: Payer: Self-pay | Admitting: Obstetrics and Gynecology

## 2013-12-20 VITALS — BP 130/60 | HR 80 | Resp 16 | Ht 63.0 in | Wt 262.0 lb

## 2013-12-20 DIAGNOSIS — Z01419 Encounter for gynecological examination (general) (routine) without abnormal findings: Secondary | ICD-10-CM

## 2013-12-20 DIAGNOSIS — Z113 Encounter for screening for infections with a predominantly sexual mode of transmission: Secondary | ICD-10-CM

## 2013-12-20 NOTE — Progress Notes (Signed)
Patient ID: Erica Ramos, female   DOB: 01/08/1976, 38 y.o.   MRN: [id number] GYNECOLOGY VISIT  PCP:  Loreen Freud, MD  Referring provider:   HPI: 38 y.o.   Single  African American  female   [id number] with Patient's last menstrual period was 12/16/2013.   here for  AEX.    Menses are regular.  Bled only once in between cycles in the past year.  History of endometrial polyps and no prior surgery. Was seen by Dr. Gerald Leitz and thinks she had a sonohysterogram.   Takes Valtrex 2 -3 times a week.  Had HSV outbreak 2 - 3 times in the last year.   Wants STD testing today.   Trying to loose weight.  Trying to exercise.  Weight Watchers has not been successful.  Nutritionist consultation in past.  Elevated BP with weight loss meds.  Has tried HCG injections.  Has considered weight loss surgery but does not have a lot of information about it.   Had a red left eye.  Allergeric conjunctivitis.  Seeing ophthalmology.   Hgb:    PCP Urine:  Neg  GYNECOLOGIC HISTORY: Patient's last menstrual period was 12/16/2013. Sexually active:  yes Partner preference: female Contraception:  Condoms.  Satisfied with this.  Menopausal hormone therapy: n/a DES exposure:  no  Blood transfusions:  no  Sexually transmitted diseases:  HSV II  GYN procedures and prior surgeries:  no Last mammogram:  never              Last pap and high risk HPV testing: 11-08-12 ASCUS:Pos. HR HPV   History of abnormal pap smear:  11-08-12 Ascus:Pos. HR HPV.  Colposcopy 11-29-12 revealed HPV chanes but no dysplasia   OB History   Grav Para Term Preterm Abortions TAB SAB Ect Mult Living   1    1  1           LIFESTYLE: Exercise: Zamba, elyptical and light weights               OTHER HEALTH MAINTENANCE: Tetanus/TDap:  11-08-12 HPV:                  no Influenza:          never   Bone density:   n/a Colonoscopy:  n/a  Cholesterol check:   11/2012  wnl at work  Family History  Problem Relation Age of Onset  .  Diabetes Mother   . Hypertension Mother     Under control  . Diabetes Maternal Aunt   . Seizures Maternal Aunt   . Diabetes Maternal Uncle   . Diabetes Maternal Grandmother   . Hypertension Maternal Grandmother   . Hypertension Other   . Obesity Other   . Alcohol abuse Father   . Asthma Sister   . Hypertension Sister   . COPD Neg Hx     Patient Active Problem List   Diagnosis Date Noted  . Obesity (BMI 30-39.9) 07/05/2013  . Anxiety 05/01/2013  . Abscess of axilla, left 05/01/2013  . Chest pain 05/01/2013  . Seasonal allergic rhinitis 06/28/2012  . Obesity, morbid 06/28/2012  . Pink eye 11/15/2011  . HTN (hypertension) 10/12/2011   Past Medical History  Diagnosis Date  . Morbid obesity     BMI 47.9  . Bronchitis     chronic - has flare every December  . Hypertension   . GERD (gastroesophageal reflux disease)     Per MD chart note 12/01/2010  .  Persistent headaches     worst during menstrual cycle  . Genital warts   . Environmental allergies   . Anxiety   . Cervical polyp     hx of  . Hx of abnormal Pap smear 2012  . STD (sexually transmitted disease)     hx genital warts/?HSV    Past Surgical History  Procedure Laterality Date  . Wisdom tooth extraction    . Colposcopy  2012    no treatment to cervix--pap smears reverted to normal    ALLERGIES: Neosporin and Penicillins  Current Outpatient Prescriptions  Medication Sig Dispense Refill  . cetirizine (ZYRTEC) 10 MG tablet Take 1 tablet (10 mg total) by mouth daily.  90 tablet  3  . cyclobenzaprine (FLEXERIL) 10 MG tablet Take 1 tablet (10 mg total) by mouth 3 (three) times daily as needed for muscle spasms.  30 tablet  0  . lisinopril-hydrochlorothiazide (PRINZIDE,ZESTORETIC) 20-12.5 MG per tablet Take 2 tablets by mouth  daily  180 tablet  1  . Multiple Vitamin (MULTIVITAMIN) tablet Take 1 tablet by mouth daily.      Marland Kitchen. omeprazole (PRILOSEC) 20 MG capsule Take 1 capsule (20 mg total) by mouth daily.  30  capsule  1  . sodium chloride (OCEAN) 0.65 % SOLN nasal spray Place 1 spray into both nostrils as needed for congestion.      . valACYclovir (VALTREX) 500 MG tablet Take 1 tablet (500 mg) once daily.  30 tablet  12  . HYDROcodone-acetaminophen (NORCO) 5-325 MG per tablet Take 1 tablet by mouth every 6 (six) hours as needed for moderate pain.  30 tablet  0   No current facility-administered medications for this visit.     ROS:  Pertinent items are noted in HPI.  History   Social History  . Marital Status: Single    Spouse Name: N/A    Number of Children: 0  . Years of Education: N/A   Occupational History  .  [employer]   Social History Main Topics  . Smoking status: Never Smoker   . Smokeless tobacco: Never Used  . Alcohol Use: 0.5 oz/week    1 drink(s) per week     Comment: Occasionally; less than 1-2 a week  . Drug Use: No  . Sexual Activity: Yes    Partners: Male    Birth Control/ Protection: Condom     Comment: condoms sometimes   Other Topics Concern  . Not on file   Social History Narrative   Exercise--  2-3 x a week for about 30 min- 60 min    PHYSICAL EXAMINATION:    BP 130/60  Pulse 80  Resp 16  Ht 5\' 3"  (1.6 m)  Wt 262 lb (118.842 kg)  BMI 46.42 kg/m2  LMP 12/16/2013   Wt Readings from Last 3 Encounters:  12/20/13 262 lb (118.842 kg)  08/09/13 263 lb (119.296 kg)  07/15/13 265 lb (120.203 kg)     Ht Readings from Last 3 Encounters:  12/20/13 5\' 3"  (1.6 m)  07/15/13 5\' 3"  (1.6 m)  11/29/12 5\' 3"  (1.6 m)    General appearance: alert, cooperative and appears stated age Head: Normocephalic, without obvious abnormality, atraumatic Neck: no adenopathy, supple, symmetrical, trachea midline and thyroid not enlarged, symmetric, no tenderness/mass/nodules Lungs: clear to auscultation bilaterally Breasts: Inspection negative, No nipple retraction or dimpling, No nipple discharge or bleeding, No axillary or supraclavicular adenopathy, Normal to  palpation without dominant masses Heart: regular rate and rhythm Abdomen: soft,  non-tender; no masses,  no organomegaly Extremities: extremities normal, atraumatic, no cyanosis or edema Skin: Skin color, texture, turgor normal. No rashes or lesions Lymph nodes: Cervical, supraclavicular, and axillary nodes normal. No abnormal inguinal nodes palpated Neurologic: Grossly normal  Pelvic: External genitalia:  no lesions              Urethra:  normal appearing urethra with no masses, tenderness or lesions              Bartholins and Skenes: normal                 Vagina: normal appearing vagina with normal color and discharge, no lesions              Cervix: normal appearance              Pap and high risk HPV testing done: Yes.          Bimanual Exam:  Uterus:  uterus is normal size, shape, consistency and nontender                                      Adnexa: normal adnexa in size, nontender and no masses                                      Rectovaginal:  No.                             ASSESSMENT  Normal gynecologic exam. History of ASCUS and positive HR HPV with colposcopy showing HPV change and no dysplasia.  HSV II. Obesity.   PLAN  Mammogram recommended yearly starting at age 32. Pap smear and high risk HPV testing as above. Counseled on self breast exam, Calcium and vitamin D intake, exercise. See lab orders:  Yes. STD testing.  Refill Valtrex.  Discussion with the patient regarding obesity and risks related to this of diabetes, cardiovascular disease, infertility, high risk pregnancy, endometrial hyperplasia and endometrial cancer.  I encouraged her to learn about weight loss surgery through [employer].  Return annually or prn   An After Visit Summary was printed and given to the patient.  15 additional minutes spent discussion obesity issues.  Over 50% was spent in counseling.

## 2013-12-20 NOTE — Patient Instructions (Signed)

## 2013-12-21 LAB — STD PANEL
HIV: NONREACTIVE
Hepatitis B Surface Ag: NEGATIVE

## 2013-12-21 LAB — HEPATITIS C ANTIBODY: HCV Ab: NEGATIVE

## 2013-12-22 MED ORDER — VALACYCLOVIR HCL 500 MG PO TABS: ORAL_TABLET | ORAL | Status: DC

## 2013-12-26 LAB — IPS PAP TEST WITH HPV

## 2013-12-26 LAB — IPS N GONORRHOEA AND CHLAMYDIA BY PCR

## 2014-01-17 ENCOUNTER — Telehealth: Payer: Self-pay | Admitting: Family Medicine

## 2014-01-17 NOTE — Telephone Encounter (Signed)
Caller name: Saphire  Relation to pt: self  Call back number: Pharmacy: [facility]   Reason for call:  Pt requesting a refill lisinopril-hydrochlorothiazide (PRINZIDE,ZESTORETIC) 20-12.5 MG per tablet please send thru mail order.

## 2014-01-30 ENCOUNTER — Other Ambulatory Visit: Payer: Self-pay | Admitting: Family Medicine

## 2014-01-30 NOTE — Telephone Encounter (Addendum)
Caller name: Deedra EhrichWhite, Ladaysha D Relation to pt: self  Call back number: [phone] Pharmacy:[facility] [address], [place], [place] [address] [phone]    Reason for call: lisinopril-hydrochlorothiazide (PRINZIDE,ZESTORETIC) 20-12.5 MG per tablet

## 2014-01-31 NOTE — Telephone Encounter (Signed)
Rx faxed.    KP 

## 2014-02-06 NOTE — Telephone Encounter (Signed)
Prescription filled on (01/31/14).//AB/CMA

## 2014-03-03 ENCOUNTER — Encounter: Payer: Self-pay | Admitting: Obstetrics and Gynecology

## 2014-04-29 ENCOUNTER — Other Ambulatory Visit: Payer: Self-pay | Admitting: *Deleted

## 2014-04-29 ENCOUNTER — Encounter: Payer: Self-pay | Admitting: Family Medicine

## 2014-04-29 ENCOUNTER — Ambulatory Visit (INDEPENDENT_AMBULATORY_CARE_PROVIDER_SITE_OTHER): Payer: 59 | Admitting: Family Medicine

## 2014-04-29 VITALS — BP 124/86 | HR 74 | Temp 98.1°F | Wt 271.0 lb

## 2014-04-29 DIAGNOSIS — I10 Essential (primary) hypertension: Secondary | ICD-10-CM

## 2014-04-29 DIAGNOSIS — K219 Gastro-esophageal reflux disease without esophagitis: Secondary | ICD-10-CM

## 2014-04-29 DIAGNOSIS — K5901 Slow transit constipation: Secondary | ICD-10-CM

## 2014-04-29 DIAGNOSIS — J302 Other seasonal allergic rhinitis: Secondary | ICD-10-CM

## 2014-04-29 MED ORDER — CETIRIZINE HCL 10 MG PO TABS: 10.0000 mg | ORAL_TABLET | Freq: Every day | ORAL | Status: AC

## 2014-04-29 MED ORDER — OMEPRAZOLE 20 MG PO CPDR: 20.0000 mg | DELAYED_RELEASE_CAPSULE | Freq: Every day | ORAL | Status: DC

## 2014-04-29 MED ORDER — LISINOPRIL-HYDROCHLOROTHIAZIDE 20-12.5 MG PO TABS: 2.0000 | ORAL_TABLET | Freq: Every day | ORAL | Status: DC

## 2014-04-29 NOTE — Assessment & Plan Note (Signed)
Discussed with pt weight loss and diet  Cont' meds

## 2014-04-29 NOTE — Patient Instructions (Signed)

## 2014-04-29 NOTE — Progress Notes (Signed)
  Subjective:     Erica Ramos is a 38 y.o. female who presents for evaluation of constipation. Onset was 3 months ago. Patient has been having frequent formed stools per day. Defecation has been difficult. Co-Morbid conditions:obesity. Symptoms have gradually worsened. Current Health Habits: Eating fiber? Not regularly, Exercise? yes - not recently, Adequate hydration? yes - water--4-5 16 oz bottles. Current over the counter/prescription laxative: bulk (psyllium) which has been somewhat effective. Pt also here to f/u bp.  No cp, sob.  The following portions of the patient's history were reviewed and updated as appropriate: allergies, current medications, past family history, past medical history, past social history, past surgical history and problem list.  Review of Systems Constitutional: negative Cardiovascular: negative for chest pain, chest pressure/discomfort, claudication, dyspnea, exertional chest pressure/discomfort, fatigue, irregular heart beat, lower extremity edema, near-syncope, orthopnea, palpitations, paroxysmal nocturnal dyspnea, syncope and tachypnea Gastrointestinal: positive for constipation   Objective:    BP 124/86 mmHg  Pulse 74  Temp(Src) 98.1 F (36.7 C) (Oral)  Wt 271 lb (122.925 kg)  SpO2 99% General appearance: alert, cooperative, appears stated age and no distress Throat: lips, mucosa, and tongue normal; teeth and gums normal Neck: no adenopathy, no carotid bruit, no JVD, supple, symmetrical, trachea midline and thyroid not enlarged, symmetric, no tenderness/mass/nodules Lungs: clear to auscultation bilaterally Heart: S1, S2 normal Abdomen: soft, non-tender; bowel sounds normal; no masses,  no organomegaly   Assessment:    Constipation   Plan:    Education about constipation causes and treatment discussed. inc fiber intake and take daily    .1. Essential hypertension Stable, con't meds - lisinopril-hydrochlorothiazide (PRINZIDE,ZESTORETIC) 20-12.5  MG per tablet; Take 2 tablets by mouth daily.  Dispense: 180 tablet; Refill: 1  2. Seasonal allergies  - cetirizine (ZYRTEC) 10 MG tablet; Take 1 tablet (10 mg total) by mouth daily.  Dispense: 90 tablet; Refill: 3  3. Slow transit constipation Fiber daily Drink plenty of water  4. Gastroesophageal reflux disease, esophagitis presence not specified  - omeprazole (PRILOSEC) 20 MG capsule; Take 1 capsule (20 mg total) by mouth daily.  Dispense: 30 capsule; Refill: 5

## 2014-04-29 NOTE — Progress Notes (Signed)
Pre visit review using our clinic review tool, if applicable. No additional management support is needed unless otherwise documented below in the visit note. 

## 2014-05-06 ENCOUNTER — Telehealth: Payer: Self-pay | Admitting: Family Medicine

## 2014-05-06 NOTE — Telephone Encounter (Signed)
Please advise      KP 

## 2014-05-06 NOTE — Telephone Encounter (Signed)
miralax daily GI referral if no better

## 2014-05-06 NOTE — Telephone Encounter (Signed)
Caller name:  Deedra EhrichWhite, Shaquille D Relation to pt: self  Call back number: [phone]   Reason for call:  Patient is following up regarding previous office visit stating that her bowel movements have not improved. Pt states she has taken benefiber and smooth tea. In need of clinical advice.

## 2014-05-06 NOTE — Telephone Encounter (Signed)
Spoke with patient and she verbalized understanding and will try the Miralax for a few days and will call back if no improvement.      KP

## 2014-09-01 ENCOUNTER — Other Ambulatory Visit: Payer: Self-pay | Admitting: Family Medicine

## 2014-10-08 ENCOUNTER — Ambulatory Visit (INDEPENDENT_AMBULATORY_CARE_PROVIDER_SITE_OTHER): Payer: 59 | Admitting: Physician Assistant

## 2014-10-08 VITALS — BP 138/84 | HR 100 | Temp 98.7°F | Resp 18 | Ht 64.0 in | Wt 269.0 lb

## 2014-10-08 DIAGNOSIS — K219 Gastro-esophageal reflux disease without esophagitis: Secondary | ICD-10-CM | POA: Diagnosis not present

## 2014-10-08 DIAGNOSIS — R1013 Epigastric pain: Secondary | ICD-10-CM

## 2014-10-08 DIAGNOSIS — N3090 Cystitis, unspecified without hematuria: Secondary | ICD-10-CM

## 2014-10-08 DIAGNOSIS — R350 Frequency of micturition: Secondary | ICD-10-CM | POA: Diagnosis not present

## 2014-10-08 DIAGNOSIS — R309 Painful micturition, unspecified: Secondary | ICD-10-CM | POA: Diagnosis not present

## 2014-10-08 LAB — POCT URINALYSIS DIPSTICK
Bilirubin, UA: NEGATIVE
GLUCOSE UA: NEGATIVE
KETONES UA: NEGATIVE
NITRITE UA: NEGATIVE
PROTEIN UA: 100
Spec Grav, UA: 1.015
Urobilinogen, UA: 0.2
pH, UA: 5.5

## 2014-10-08 LAB — POCT UA - MICROSCOPIC ONLY
Casts, Ur, LPF, POC: NEGATIVE
Crystals, Ur, HPF, POC: NEGATIVE
Mucus, UA: NEGATIVE
Yeast, UA: NEGATIVE

## 2014-10-08 MED ORDER — NITROFURANTOIN MONOHYD MACRO 100 MG PO CAPS: 100.0000 mg | ORAL_CAPSULE | Freq: Two times a day (BID) | ORAL | Status: AC

## 2014-10-08 MED ORDER — OMEPRAZOLE 20 MG PO CPDR: 20.0000 mg | DELAYED_RELEASE_CAPSULE | Freq: Every day | ORAL | Status: DC

## 2014-10-08 NOTE — Progress Notes (Signed)
[facility] [address], [place] [address] [phone]  Date:  10/08/2014   Name:  Erica Ramos   DOB:  01-06-1976   MRN:  [id number]  PCP:  Loreen FreudYvonne Lowne, DO    Chief Complaint: Abdominal Pain; Dysuria; and Urinary Frequency   History of Present Illness:  This is a 39 y.o. female with PMH anxiety, HTN and allergic rhinitis who is presenting with 1 day of dysuria, urinary frequency and abdominal pain. She is having burning at the end of urination. Period started today. She is sexually active with female partner that she has been with for past 7-8 months. Uses condoms inconsistently for contraception. Last tested for STDs 1 year ago. Has genital HSV. Has had HPV in the past. Reports last UTI 8 years ago. She denies constipation - she has soft BMs daily. She denies fever, chills, N/V, lower abdominal pain or back pain.  Pt is complaining of epigastric pain for the past 2 months described as pressure. Pain is better when she presses her hand over that area. States she used to take omeprazole daily but ran out of her Rx 2 months ago. States pain is worse after eating but can't recall what foods pain is worse after. She denies n/v/d, constipation, fever, chills. She told her PCP about this when it started. PCP thought it could be constipation and told her to take miralax prn. She does not take this as no problems.   Review of Systems:  Review of Systems See HPI  Patient Active Problem List   Diagnosis Date Noted  . Anxiety 05/01/2013  . Abscess of axilla, left 05/01/2013  . Chest pain 05/01/2013  . Seasonal allergic rhinitis 06/28/2012  . Obesity, morbid 06/28/2012  . HTN (hypertension) 10/12/2011    Prior to Admission medications   Medication Sig Start Date End Date Taking? Authorizing Provider  cetirizine (ZYRTEC) 10 MG tablet Take 1 tablet (10 mg total) by mouth daily. 04/29/14  Yes Lelon PerlaYvonne R Lowne, DO  lisinopril-hydrochlorothiazide (PRINZIDE,ZESTORETIC)  20-12.5 MG per tablet Take 2 tablets by mouth  daily 09/01/14  Yes Lelon PerlaYvonne R Lowne, DO  Multiple Vitamin (MULTIVITAMIN) tablet Take 1 tablet by mouth daily.   Yes Historical Provider, MD  sodium chloride (OCEAN) 0.65 % SOLN nasal spray Place 1 spray into both nostrils as needed for congestion.   Yes Historical Provider, MD  valACYclovir (VALTREX) 500 MG tablet Take 1 tablet (500 mg) once daily. 12/22/13  Yes Brook Rosalin HawkingE Amundson C Silva, MD           Allergies  Allergen Reactions  . Neosporin [Neomycin-Bacitracin Zn-Polymyx] Anaphylaxis  . Penicillins Rash and Other (See Comments)    Fever    Past Surgical History  Procedure Laterality Date  . Wisdom tooth extraction    . Colposcopy  2012    no treatment to cervix--pap smears reverted to normal    History  Substance Use Topics  . Smoking status: Never Smoker   . Smokeless tobacco: Never Used  . Alcohol Use: 0.5 oz/week    1 drink(s) per week     Comment: Occasionally; less than 1-2 a week    Family History  Problem Relation Age of Onset  . Diabetes Mother   . Hypertension Mother     Under control  . Diabetes Maternal Aunt   . Seizures Maternal Aunt   . Diabetes Maternal Uncle   . Diabetes Maternal Grandmother   . Hypertension Maternal Grandmother   .  Hypertension Other   . Obesity Other   . Alcohol abuse Father   . Asthma Sister   . Hypertension Sister   . COPD Neg Hx     Medication list has been reviewed and updated.  Physical Examination:  Physical Exam  Constitutional: She is oriented to person, place, and time. She appears well-developed and well-nourished. No distress.  HENT:  Head: Normocephalic and atraumatic.  Right Ear: Hearing normal.  Left Ear: Hearing normal.  Nose: Nose normal.  Mouth/Throat: Uvula is midline, oropharynx is clear and moist and mucous membranes are normal.  Eyes: Conjunctivae and lids are normal. Right eye exhibits no discharge. Left eye exhibits no discharge. No scleral icterus.   Cardiovascular: Normal rate, regular rhythm, normal heart sounds and normal pulses.   No murmur heard. Pulmonary/Chest: Effort normal and breath sounds normal. No respiratory distress. She has no wheezes. She has no rhonchi. She has no rales.  Abdominal: Soft. Normal appearance and bowel sounds are normal. There is tenderness in the epigastric area. There is no rigidity, no rebound, no guarding, no CVA tenderness and negative Murphy's sign. No hernia.  Palpation over epigastric region produced reflux feeling into throat No suprapubic pain.  Musculoskeletal: Normal range of motion.  Lymphadenopathy:       Head (right side): No submental, no submandibular and no tonsillar adenopathy present.       Head (left side): No submental, no submandibular and no tonsillar adenopathy present.    She has no cervical adenopathy.  Neurological: She is alert and oriented to person, place, and time.  Skin: Skin is warm, dry and intact. No lesion and no rash noted.  Psychiatric: She has a normal mood and affect. Her speech is normal and behavior is normal. Thought content normal.   BP 138/84 mmHg  Pulse 100  Temp(Src) 98.7 F (37.1 C) (Oral)  Resp 18  Ht  (1.626 m)  Wt 269 lb (122.018 kg)  BMI 46.15 kg/m2  SpO2 99%  LMP 10/08/2014  Results for orders placed or performed in visit on 10/08/14  POCT urinalysis dipstick  Result Value Ref Range   Color, UA yellow    Clarity, UA cloudy    Glucose, UA neg    Bilirubin, UA neg    Ketones, UA neg    Spec Grav, UA 1.015    Blood, UA large    pH, UA 5.5    Protein, UA 100    Urobilinogen, UA 0.2    Nitrite, UA neg    Leukocytes, UA moderate (2+)   POCT UA - Microscopic Only  Result Value Ref Range   WBC, Ur, HPF, POC 30-40    RBC, urine, microscopic 25-30    Bacteria, U Microscopic 1+    Mucus, UA neg    Epithelial cells, urine per micros 1-3    Crystals, Ur, HPF, POC neg    Casts, Ur, LPF, POC neg    Yeast, UA neg     Assessment and  Plan:  1. Cystitis 2. Pain with urination 3. Frequent urination UA suggestive of UTI. Urine culture pending. Prescribed Macrobid twice a day 7 days. Counseled on hydration. AZO for pain relief. If not getting better in 5-7 days return. - nitrofurantoin, macrocrystal-monohydrate, (MACROBID) 100 MG capsule; Take 1 capsule (100 mg total) by mouth 2 (two) times daily.  Dispense: 14 capsule; Refill: 0 - Urine culture -UA and micro  4. Epigastric pain 5. GERD Epigastric pain and cessation of omeprazole coincide in timing.  Patient's symptoms are likely due to GERD. Also possible to be due to gallbladder disease. Patient will keep a diary of food that causes pain. She will restart Prilosec daily for the next 2 months. After that she will take every other day if possible. CBC, CMP and lipase pending. - CBC - Comprehensive metabolic panel - Lipase - omeprazole (PRILOSEC) 20 MG capsule; Take 1 capsule (20 mg total) by mouth daily.  Dispense: 30 capsule; Refill: 5   Nicole V. Dyke Brackett, MHS [facility]  10/09/2014

## 2014-10-08 NOTE — Patient Instructions (Addendum)
Take Macrobid twice a day for 7 days. I will call you with results of your urine culture. Drink plenty of fluids at least 64 ounces a day. Take Azo over-the-counter as directed for urinary pain. Start paying attention to your abdominal pain and what makes it worse. Keep a food diary. I will call you with results of your lab tests. Start back on your Prilosec daily for next 1-2 months. Thereafter take every other day if symptoms are controlled.

## 2014-10-09 LAB — COMPREHENSIVE METABOLIC PANEL
ALK PHOS: 58 U/L (ref 39–117)
ALT: 14 U/L (ref 0–35)
AST: 14 U/L (ref 0–37)
Albumin: 4.4 g/dL (ref 3.5–5.2)
BUN: 14 mg/dL (ref 6–23)
CHLORIDE: 103 meq/L (ref 96–112)
CO2: 26 meq/L (ref 19–32)
CREATININE: 0.77 mg/dL (ref 0.50–1.10)
Calcium: 9.8 mg/dL (ref 8.4–10.5)
Glucose, Bld: 100 mg/dL — ABNORMAL HIGH (ref 70–99)
Potassium: 4.2 mEq/L (ref 3.5–5.3)
SODIUM: 137 meq/L (ref 135–145)
Total Bilirubin: 0.3 mg/dL (ref 0.2–1.2)
Total Protein: 7 g/dL (ref 6.0–8.3)

## 2014-10-09 LAB — CBC
HCT: 37.6 % (ref 36.0–46.0)
Hemoglobin: 12.6 g/dL (ref 12.0–15.0)
MCH: 25.9 pg — AB (ref 26.0–34.0)
MCHC: 33.5 g/dL (ref 30.0–36.0)
MCV: 77.4 fL — AB (ref 78.0–100.0)
MPV: 9.3 fL (ref 8.6–12.4)
Platelets: 384 10*3/uL (ref 150–400)
RBC: 4.86 MIL/uL (ref 3.87–5.11)
RDW: 14.8 % (ref 11.5–15.5)
WBC: 14.6 10*3/uL — ABNORMAL HIGH (ref 4.0–10.5)

## 2014-10-09 LAB — LIPASE: Lipase: 20 U/L (ref 0–75)

## 2014-10-12 LAB — URINE CULTURE: Colony Count: >=100000

## 2014-10-15 ENCOUNTER — Telehealth: Payer: Self-pay | Admitting: *Deleted

## 2014-10-30 ENCOUNTER — Ambulatory Visit (INDEPENDENT_AMBULATORY_CARE_PROVIDER_SITE_OTHER): Payer: 59 | Admitting: Family Medicine

## 2014-10-30 ENCOUNTER — Encounter: Payer: Self-pay | Admitting: Family Medicine

## 2014-10-30 VITALS — BP 124/70 | HR 84 | Temp 99.6°F | Ht 64.0 in | Wt 267.0 lb

## 2014-10-30 DIAGNOSIS — Z833 Family history of diabetes mellitus: Secondary | ICD-10-CM

## 2014-10-30 DIAGNOSIS — I10 Essential (primary) hypertension: Secondary | ICD-10-CM | POA: Diagnosis not present

## 2014-10-30 DIAGNOSIS — K5901 Slow transit constipation: Secondary | ICD-10-CM | POA: Diagnosis not present

## 2014-10-30 DIAGNOSIS — K5900 Constipation, unspecified: Secondary | ICD-10-CM | POA: Insufficient documentation

## 2014-10-30 LAB — HEPATIC FUNCTION PANEL
ALBUMIN: 4.1 g/dL (ref 3.5–5.2)
ALK PHOS: 57 U/L (ref 39–117)
ALT: 15 U/L (ref 0–35)
AST: 19 U/L (ref 0–37)
BILIRUBIN DIRECT: 0.1 mg/dL (ref 0.0–0.3)
BILIRUBIN TOTAL: 0.3 mg/dL (ref 0.2–1.2)
Total Protein: 7.2 g/dL (ref 6.0–8.3)

## 2014-10-30 LAB — LIPID PANEL
CHOL/HDL RATIO: 3
Cholesterol: 144 mg/dL (ref 0–200)
HDL: 44.5 mg/dL (ref >39.00)
LDL Cholesterol: 89 mg/dL (ref 0–99)
NONHDL: 99.5
TRIGLYCERIDES: 52 mg/dL (ref 0.0–149.0)
VLDL: 10.4 mg/dL (ref 0.0–40.0)

## 2014-10-30 LAB — BASIC METABOLIC PANEL WITH GFR
BUN: 12 mg/dL (ref 6–23)
CO2: 29 meq/L (ref 19–32)
Calcium: 9.5 mg/dL (ref 8.4–10.5)
Chloride: 103 meq/L (ref 96–112)
Creatinine, Ser: 0.74 mg/dL (ref 0.40–1.20)
GFR: 113.81 mL/min
Glucose, Bld: 89 mg/dL (ref 70–99)
Potassium: 4.1 meq/L (ref 3.5–5.1)
Sodium: 137 meq/L (ref 135–145)

## 2014-10-30 LAB — MICROALBUMIN / CREATININE URINE RATIO
Creatinine,U: 27.3 mg/dL
Microalb Creat Ratio: 2.6 mg/g (ref 0.0–30.0)

## 2014-10-30 LAB — HEMOGLOBIN A1C: HEMOGLOBIN A1C: 5.7 % (ref 4.6–6.5)

## 2014-10-30 MED ORDER — LISINOPRIL-HYDROCHLOROTHIAZIDE 20-12.5 MG PO TABS
ORAL_TABLET | ORAL | Status: DC
Start: 2014-10-30 — End: 2014-12-19

## 2014-10-30 NOTE — Progress Notes (Signed)
Pre visit review using our clinic review tool, if applicable. No additional management support is needed unless otherwise documented below in the visit note. 

## 2014-10-30 NOTE — Patient Instructions (Signed)

## 2014-10-30 NOTE — Assessment & Plan Note (Signed)
Increase fiber in diet Drink plenty of water miralax prn

## 2014-10-30 NOTE — Progress Notes (Signed)
Subjective:    Patient ID: Erica EhrichChandra D Slattery, female    DOB: 10-12-1975, 39 y.o.   MRN: [id number]  HPI  Patient here for f/u bp and c/o constipation x several months.  She is eating healthier and is taking benefiber daily and taking miralax prn.  No abd pain.  She does have BM daily.    Past Medical History  Diagnosis Date  . Morbid obesity     BMI 47.9  . Bronchitis     chronic - has flare every December  . Hypertension   . GERD (gastroesophageal reflux disease)     Per MD chart note 12/01/2010  . Persistent headaches     worst during menstrual cycle  . Genital warts   . Environmental allergies   . Anxiety   . Cervical polyp     hx of  . Hx of abnormal Pap smear 2012  . STD (sexually transmitted disease)     hx genital warts/?HSV    Review of Systems  Constitutional: Negative for diaphoresis, appetite change, fatigue and unexpected weight change.  Eyes: Negative for pain, redness and visual disturbance.  Respiratory: Negative for cough, chest tightness, shortness of breath and wheezing.   Cardiovascular: Negative for chest pain, palpitations and leg swelling.  Endocrine: Negative for cold intolerance, heat intolerance, polydipsia, polyphagia and polyuria.  Genitourinary: Negative for dysuria, frequency and difficulty urinating.  Neurological: Negative for dizziness, light-headedness, numbness and headaches.  Psychiatric/Behavioral: Negative for dysphoric mood. The patient is not nervous/anxious.     Current Outpatient Prescriptions on File Prior to Visit  Medication Sig Dispense Refill  . cetirizine (ZYRTEC) 10 MG tablet Take 1 tablet (10 mg total) by mouth daily. 90 tablet 3  . Multiple Vitamin (MULTIVITAMIN) tablet Take 1 tablet by mouth daily.    Marland Kitchen. omeprazole (PRILOSEC) 20 MG capsule Take 1 capsule (20 mg total) by mouth daily. 30 capsule 5  . sodium chloride (OCEAN) 0.65 % SOLN nasal spray Place 1 spray into both nostrils as needed for congestion.    . valACYclovir  (VALTREX) 500 MG tablet Take 1 tablet (500 mg) once daily. 30 tablet 12   No current facility-administered medications on file prior to visit.       Objective:    Physical Exam  Constitutional: She is oriented to person, place, and time. She appears well-developed and well-nourished.  HENT:  Head: Normocephalic and atraumatic.  Eyes: Conjunctivae and EOM are normal.  Neck: Normal range of motion. Neck supple. No JVD present. Carotid bruit is not present. No thyromegaly present.  Cardiovascular: Normal rate and regular rhythm.   Murmur heard. Pulmonary/Chest: Effort normal and breath sounds normal. No respiratory distress. She has no wheezes. She has no rales. She exhibits no tenderness.  Musculoskeletal: She exhibits no edema.  Neurological: She is alert and oriented to person, place, and time.  Psychiatric: She has a normal mood and affect. Her behavior is normal.    BP 124/70 mmHg  Pulse 84  Temp(Src) 99.6 F (37.6 C) (Oral)  Ht 5\' 4"  (1.626 m)  Wt 267 lb (121.11 kg)  BMI 45.81 kg/m2  SpO2 98%  LMP 10/08/2014 Wt Readings from Last 3 Encounters:  10/30/14 267 lb (121.11 kg)  10/08/14 269 lb (122.018 kg)  04/29/14 271 lb (122.925 kg)     Lab Results  Component Value Date   WBC 14.6* 10/08/2014   HGB 12.6 10/08/2014   HCT 37.6 10/08/2014   PLT 384 10/08/2014   GLUCOSE 100* 10/08/2014  CHOL 145 12/26/2012   TRIG 76.0 12/26/2012   HDL 43.60 12/26/2012   LDLCALC 86 12/26/2012   ALT 14 10/08/2014   AST 14 10/08/2014   NA 137 10/08/2014   K 4.2 10/08/2014   CL 103 10/08/2014   CREATININE 0.77 10/08/2014   BUN 14 10/08/2014   CO2 26 10/08/2014   TSH 1.62 12/26/2012   HGBA1C 5.8 10/12/2011       Assessment & Plan:   Problem List Items Addressed This Visit    HTN (hypertension)    con't lisinopril hct Stable Check labs      Relevant Medications   lisinopril-hydrochlorothiazide (PRINZIDE,ZESTORETIC) 20-12.5 MG per tablet   Other Relevant Orders   Basic  metabolic panel   Hepatic function panel   Lipid panel   Microalbumin / creatinine urine ratio   POCT urinalysis dipstick   Constipation - Primary    Increase fiber in diet Drink plenty of water miralax prn        Other Visit Diagnoses    Family history of diabetes mellitus        Relevant Orders    Hemoglobin A1c       I am having Ms. Ayuso maintain her multivitamin, sodium chloride, valACYclovir, cetirizine, omeprazole, and lisinopril-hydrochlorothiazide.  Meds ordered this encounter  Medications  . lisinopril-hydrochlorothiazide (PRINZIDE,ZESTORETIC) 20-12.5 MG per tablet    Sig: Take 2 tablets by mouth  daily    Dispense:  180 tablet    Refill:  1     Loreen Freud, DO

## 2014-10-30 NOTE — Assessment & Plan Note (Signed)
con't lisinopril hct Stable Check labs

## 2014-10-31 LAB — POCT URINALYSIS DIPSTICK
Bilirubin, UA: NEGATIVE
Blood, UA: NEGATIVE
Glucose, UA: NEGATIVE
Ketones, UA: NEGATIVE
Leukocytes, UA: NEGATIVE
NITRITE UA: NEGATIVE
PROTEIN UA: NEGATIVE
Spec Grav, UA: 1.01
Urobilinogen, UA: 4
pH, UA: 7

## 2014-11-05 NOTE — Telephone Encounter (Signed)
error 

## 2014-12-15 ENCOUNTER — Telehealth: Payer: Self-pay | Admitting: Family Medicine

## 2014-12-15 ENCOUNTER — Other Ambulatory Visit: Payer: Self-pay | Admitting: Family Medicine

## 2014-12-15 MED ORDER — AMLODIPINE BESYLATE 5 MG PO TABS: 5.0000 mg | ORAL_TABLET | Freq: Every day | ORAL | Status: DC

## 2014-12-15 NOTE — Telephone Encounter (Signed)
Pt called again to check on below. Said that she has not taken bp meds today. She is worried bc of side effects. Please call [phone] or [phone].

## 2014-12-15 NOTE — Telephone Encounter (Signed)
D/c lisinopril norvasc 5 mg #30 1 po qd , 2 refills ---- ov 2-3 weeks All meds have potential side effects there is not one med that does not have any

## 2014-12-15 NOTE — Telephone Encounter (Signed)
Please advise      KP 

## 2014-12-15 NOTE — Telephone Encounter (Signed)
Rx faxed. Detailed message left making the patient aware, along with the below information and asked for a call back to schedule the apt.Marland Kitchen     KP

## 2014-12-15 NOTE — Telephone Encounter (Signed)
Caller na Relation to ZO:XWRU Call back [phone] or [phone] Pharmacy:[facility] [place]  Reason for call: pt would like to know if dr. Laury Axon can change her bp meds, states she has researched the rx lisinopril-hydrochlorothiazide (PRINZIDE,ZESTORETIC) 20-12.5 MG and there is a lot of bad side effects states she doesn't want to change to any other meds ending in "PRIL". Pt states she has not taken her meds for today and would like for dr. Laury Axon to send in her something else today so she doesn't missed taking her bp meds.

## 2014-12-19 ENCOUNTER — Telehealth: Payer: Self-pay | Admitting: Family Medicine

## 2014-12-19 NOTE — Telephone Encounter (Signed)
Pt states that her feet and ankles are swelling since starting amlodipine 12/15/14. Please call pt to advise [phone].

## 2014-12-19 NOTE — Telephone Encounter (Signed)
Pt calling back again about ankles and legs swelling. Please call her back at [phone].

## 2014-12-19 NOTE — Telephone Encounter (Signed)
Spoke with patient and she said she started swelling on Wednesday s/p starting the Amlodipine on Monday, she is not having any pain just tightness. The swelling includes the feet and ankles. The patient wanted to know what to do?  Please advise in Dr.Lowne absence.    KP

## 2014-12-19 NOTE — Telephone Encounter (Signed)
Message left to call the office.    KP 

## 2014-12-19 NOTE — Telephone Encounter (Signed)
Decrease to 1/2 tab of Amlodipine, increase water intake, limit salt and see if swelling improves.  If no improvement by Monday, she will need to let us know so we can switch medications entirely

## 2014-12-19 NOTE — Telephone Encounter (Signed)
Spoke with patient and she stated she will break the tab in half, increase her fluid  And limit her salt intake, she will also elevated her and if no improvement she will call on Monday, she did admit to being on her feet all day yesterday when the swelling was worst.      KP

## 2014-12-30 ENCOUNTER — Ambulatory Visit: Payer: 59 | Admitting: Family Medicine

## 2014-12-30 ENCOUNTER — Encounter: Payer: Self-pay | Admitting: Family Medicine

## 2014-12-30 ENCOUNTER — Ambulatory Visit (INDEPENDENT_AMBULATORY_CARE_PROVIDER_SITE_OTHER): Payer: 59 | Admitting: Family Medicine

## 2014-12-30 VITALS — BP 146/84 | HR 79 | Temp 98.8°F | Wt 269.0 lb

## 2014-12-30 DIAGNOSIS — I10 Essential (primary) hypertension: Secondary | ICD-10-CM

## 2014-12-30 DIAGNOSIS — M25561 Pain in right knee: Secondary | ICD-10-CM

## 2014-12-30 MED ORDER — METOPROLOL SUCCINATE ER 50 MG PO TB24: 50.0000 mg | ORAL_TABLET | Freq: Every day | ORAL | Status: DC

## 2014-12-30 NOTE — Progress Notes (Signed)
atient ID: Erica Ramos, female    DOB: 1975-09-22  Age: 39 y.o. MRN: [id number]    Subjective:  Subjective HPI Erica Ramos presents for bp check.   She does not like to norvasc because of swelling in legs.  She would like to change the meds.   Pt also c/o R knee pain -- she has been doing squats and exercises --- she is requesting a referral.    Review of Systems  Constitutional: Negative for diaphoresis, appetite change, fatigue and unexpected weight change.  Eyes: Negative for pain, redness and visual disturbance.  Respiratory: Negative for cough, chest tightness, shortness of breath and wheezing.   Cardiovascular: Negative for chest pain, palpitations and leg swelling.  Endocrine: Negative for cold intolerance, heat intolerance, polydipsia, polyphagia and polyuria.  Genitourinary: Negative for dysuria, frequency and difficulty urinating.  Neurological: Negative for dizziness, light-headedness, numbness and headaches.    History Past Medical History  Diagnosis Date  . Morbid obesity     BMI 47.9  . Bronchitis     chronic - has flare every December  . Hypertension   . GERD (gastroesophageal reflux disease)     Per MD chart note 12/01/2010  . Persistent headaches     worst during menstrual cycle  . Genital warts   . Environmental allergies   . Anxiety   . Cervical polyp     hx of  . Hx of abnormal Pap smear 2012  . STD (sexually transmitted disease)     hx genital warts/?HSV    She has past surgical history that includes Wisdom tooth extraction and Colposcopy (2012).   Her family history includes Alcohol abuse in her father; Asthma in her sister; Diabetes in her maternal aunt, maternal grandmother, maternal uncle, and mother; Hypertension in her maternal grandmother, mother, other, and sister; Obesity in her other; Seizures in her maternal aunt. There is no history of COPD.She reports that she has never smoked. She has never used smokeless tobacco. She reports that  she drinks about 0.5 oz of alcohol per week. She reports that she does not use illicit drugs.  Current Outpatient Prescriptions on File Prior to Visit  Medication Sig Dispense Refill  . cetirizine (ZYRTEC) 10 MG tablet Take 1 tablet (10 mg total) by mouth daily. 90 tablet 3  . Multiple Vitamin (MULTIVITAMIN) tablet Take 1 tablet by mouth daily.    Marland Kitchen omeprazole (PRILOSEC) 20 MG capsule Take 1 capsule (20 mg total) by mouth daily. 30 capsule 5  . sodium chloride (OCEAN) 0.65 % SOLN nasal spray Place 1 spray into both nostrils as needed for congestion.    . valACYclovir (VALTREX) 500 MG tablet Take 1 tablet (500 mg) once daily. 30 tablet 12   No current facility-administered medications on file prior to visit.     Objective:  Objective Physical Exam  Constitutional: She is oriented to person, place, and time. She appears well-developed and well-nourished.  HENT:  Head: Normocephalic and atraumatic.  Eyes: Conjunctivae and EOM are normal.  Neck: Normal range of motion. Neck supple. No JVD present. Carotid bruit is not present. No thyromegaly present.  Cardiovascular: Normal rate, regular rhythm and normal heart sounds.   No murmur heard. Pulmonary/Chest: Effort normal and breath sounds normal. No respiratory distress. She has no wheezes. She has no rales. She exhibits no tenderness.  Musculoskeletal: She exhibits no edema.  Neurological: She is alert and oriented to person, place, and time.  Psychiatric: She has a normal mood and affect.  Her behavior is normal.   BP 146/84 mmHg  Pulse 79  Temp(Src) 98.8 F (37.1 C) (Oral)  Wt 269 lb (122.018 kg)  SpO2 98% Wt Readings from Last 3 Encounters:  12/30/14 269 lb (122.018 kg)  10/30/14 267 lb (121.11 kg)  10/08/14 269 lb (122.018 kg)     Lab Results  Component Value Date   WBC 14.6* 10/08/2014   HGB 12.6 10/08/2014   HCT 37.6 10/08/2014   PLT 384 10/08/2014   GLUCOSE 89 10/30/2014   CHOL 144 10/30/2014   TRIG 52.0 10/30/2014    HDL 44.50 10/30/2014   LDLCALC 89 10/30/2014   ALT 15 10/30/2014   AST 19 10/30/2014   NA 137 10/30/2014   K 4.1 10/30/2014   CL 103 10/30/2014   CREATININE 0.74 10/30/2014   BUN 12 10/30/2014   CO2 29 10/30/2014   TSH 1.62 12/26/2012   HGBA1C 5.7 10/30/2014   MICROALBUR <0.7 10/30/2014    Dg Lumbar Spine Complete  08/09/2013   CLINICAL DATA:  Low back pain with right-sided radicular symptoms  EXAM: LUMBAR SPINE - COMPLETE 4+ VIEW  COMPARISON:  None.  FINDINGS: Frontal, lateral, spot lumbosacral lateral, and bilateral oblique views were obtained. There are 5 non-rib-bearing lumbar type vertebral bodies. There is lumbar levoscoliosis. There is no fracture or spondylolisthesis. Disc spaces appear intact. There is no appreciable facet arthropathy.  IMPRESSION: Scoliosis.  No fracture or appreciable arthropathy.   Electronically Signed   By: Bretta Bang M.D.   On: 08/09/2013 13:17     Assessment & Plan:  Plan I have discontinued Erica Ramos's amLODipine. I am also having her start on metoprolol succinate. Additionally, I am having her maintain her multivitamin, sodium chloride, valACYclovir, cetirizine, and omeprazole.  Meds ordered this encounter  Medications  . metoprolol succinate (TOPROL-XL) 50 MG 24 hr tablet    Sig: Take 1 tablet (50 mg total) by mouth daily. Take with or immediately following a meal.    Dispense:  90 tablet    Refill:  3    Problem List Items Addressed This Visit    None    Visit Diagnoses    Essential hypertension    -  Primary    Relevant Medications    metoprolol succinate (TOPROL-XL) 50 MG 24 hr tablet    Recurrent knee pain, right        Relevant Orders    Ambulatory referral to Sports Medicine    Morbid obesity           Follow-up: Return in about 3 months (around 04/01/2015), or if symptoms worsen or fail to improve, for hypertension , annual exam, fasting.  Loreen Freud, DO

## 2014-12-30 NOTE — Patient Instructions (Signed)

## 2014-12-30 NOTE — Progress Notes (Signed)
Pre visit review using our clinic review tool, if applicable. No additional management support is needed unless otherwise documented below in the visit note. 

## 2015-01-02 ENCOUNTER — Ambulatory Visit: Payer: 59 | Admitting: Family Medicine

## 2015-01-08 ENCOUNTER — Ambulatory Visit: Payer: 59 | Admitting: Family Medicine

## 2015-01-15 ENCOUNTER — Ambulatory Visit: Payer: 59 | Admitting: Family Medicine

## 2015-01-22 ENCOUNTER — Ambulatory Visit: Payer: 59 | Admitting: Family Medicine

## 2015-01-29 ENCOUNTER — Ambulatory Visit: Payer: 59 | Admitting: Family Medicine

## 2015-02-06 ENCOUNTER — Ambulatory Visit: Payer: 59 | Admitting: Physician Assistant

## 2015-02-06 ENCOUNTER — Encounter: Payer: Self-pay | Admitting: Obstetrics and Gynecology

## 2015-02-06 ENCOUNTER — Encounter: Payer: Self-pay | Admitting: Physician Assistant

## 2015-02-06 ENCOUNTER — Ambulatory Visit (INDEPENDENT_AMBULATORY_CARE_PROVIDER_SITE_OTHER): Payer: 59 | Admitting: Obstetrics and Gynecology

## 2015-02-06 ENCOUNTER — Telehealth: Payer: Self-pay | Admitting: Physician Assistant

## 2015-02-06 ENCOUNTER — Ambulatory Visit (INDEPENDENT_AMBULATORY_CARE_PROVIDER_SITE_OTHER): Payer: 59 | Admitting: Physician Assistant

## 2015-02-06 VITALS — BP 162/100 | HR 64 | Resp 14 | Ht 63.5 in | Wt 266.2 lb

## 2015-02-06 VITALS — BP 164/102 | HR 83 | Temp 97.6°F | Resp 16 | Ht 63.5 in | Wt 263.0 lb

## 2015-02-06 DIAGNOSIS — Z01419 Encounter for gynecological examination (general) (routine) without abnormal findings: Secondary | ICD-10-CM | POA: Diagnosis not present

## 2015-02-06 DIAGNOSIS — R635 Abnormal weight gain: Secondary | ICD-10-CM

## 2015-02-06 DIAGNOSIS — Z0000 Encounter for general adult medical examination without abnormal findings: Secondary | ICD-10-CM | POA: Diagnosis not present

## 2015-02-06 DIAGNOSIS — Z3169 Encounter for other general counseling and advice on procreation: Secondary | ICD-10-CM

## 2015-02-06 DIAGNOSIS — Z113 Encounter for screening for infections with a predominantly sexual mode of transmission: Secondary | ICD-10-CM

## 2015-02-06 DIAGNOSIS — I10 Essential (primary) hypertension: Secondary | ICD-10-CM

## 2015-02-06 DIAGNOSIS — I4949 Other premature depolarization: Secondary | ICD-10-CM

## 2015-02-06 LAB — BASIC METABOLIC PANEL
BUN: 9 mg/dL (ref 7–25)
CALCIUM: 9.2 mg/dL (ref 8.6–10.2)
CO2: 22 mmol/L (ref 20–31)
Chloride: 106 mmol/L (ref 98–110)
Creat: 0.68 mg/dL (ref 0.50–1.10)
GLUCOSE: 76 mg/dL (ref 65–99)
Potassium: 3.9 mmol/L (ref 3.5–5.3)
SODIUM: 141 mmol/L (ref 135–146)

## 2015-02-06 LAB — POCT URINALYSIS DIPSTICK
Bilirubin, UA: NEGATIVE
Glucose, UA: NEGATIVE
KETONES UA: NEGATIVE
LEUKOCYTES UA: NEGATIVE
Nitrite, UA: NEGATIVE
PH UA: 5
PROTEIN UA: NEGATIVE
RBC UA: NEGATIVE
Urobilinogen, UA: NEGATIVE

## 2015-02-06 LAB — THYROID PANEL WITH TSH
Free Thyroxine Index: 2.3 (ref 1.4–3.8)
T3 Uptake: 29 % (ref 22–35)
T4, Total: 7.8 ug/dL (ref 4.5–12.0)
TSH: 2.342 u[IU]/mL (ref 0.350–4.500)

## 2015-02-06 MED ORDER — HYDROCHLOROTHIAZIDE 12.5 MG PO TABS: 12.5000 mg | ORAL_TABLET | Freq: Every day | ORAL | Status: DC

## 2015-02-06 MED ORDER — VALACYCLOVIR HCL 500 MG PO TABS: ORAL_TABLET | ORAL | Status: DC

## 2015-02-06 NOTE — Patient Instructions (Addendum)

## 2015-02-06 NOTE — Progress Notes (Addendum)
Patient ID: Erica Ramos, female   DOB: Jul 20, 1975, 39 y.o.   MRN: [id number] 39 y.o. G1P0010 Single African American female here for annual exam.    Menses every 30 days and last 6 days, not clotting so much.  Thinking about future fertility.  Not as much cramping.  Has breast tenderness.  Sort of trying for pregnancy.  Wants thyroid panel checked.  Having problems with weight gain and is discouraged.  Upset with elevated blood pressure today.   Broke up with long term boyfriend.  Still work together.  In a new relationship.   PCP:   Loreen Freud, DO  Patient's last menstrual period was 01/14/2015 (exact date).          Sexually active: Yes.   female partner The current method of family planning is condoms most of the time.    Exercising: Yes.    walking and zumba 3x/week. Smoker:  no  Health Maintenance: Pap:  12-20-13 Neg:Neg HR HPV History of abnormal Pap:  Yes, Hx 11-08-12 Ascus:Pos HR HPV;colposcopy revealed HPV changes, no dysplasia. MMG:  n/a Colonoscopy:  n/a BMD:   n/a  Result  n/a TDaP:  11-08-12 Screening Labs:  Hb today: PCP, Urine today: Neg   reports that she has never smoked. She has never used smokeless tobacco. She reports that she drinks about 0.6 oz of alcohol per week. She reports that she does not use illicit drugs.  Past Medical History  Diagnosis Date  . Morbid obesity (HCC)     BMI 47.9  . Bronchitis     chronic - has flare every December  . Hypertension   . GERD (gastroesophageal reflux disease)     Per MD chart note 12/01/2010  . Persistent headaches     worst during menstrual cycle  . Genital warts   . Environmental allergies   . Anxiety   . Cervical polyp     hx of  . Hx of abnormal Pap smear 2012  . STD (sexually transmitted disease)     hx genital warts/?HSV    Past Surgical History  Procedure Laterality Date  . Wisdom tooth extraction    . Colposcopy  2012    no treatment to cervix--pap smears reverted to normal    Current  Outpatient Prescriptions  Medication Sig Dispense Refill  . cetirizine (ZYRTEC) 10 MG tablet Take 1 tablet (10 mg total) by mouth daily. 90 tablet 3  . metoprolol succinate (TOPROL-XL) 50 MG 24 hr tablet Take 1 tablet (50 mg total) by mouth daily. Take with or immediately following a meal. 90 tablet 3  . omeprazole (PRILOSEC) 20 MG capsule Take 1 capsule (20 mg total) by mouth daily. 30 capsule 5  . Prenatal Vit-Fe Fumarate-FA (MULTIVITAMIN-PRENATAL) 27-0.8 MG TABS tablet Take 1 tablet by mouth daily at 12 noon.    . sodium chloride (OCEAN) 0.65 % SOLN nasal spray Place 1 spray into both nostrils as needed for congestion.    . valACYclovir (VALTREX) 500 MG tablet Take 1 tablet (500 mg) once daily. 30 tablet 12   No current facility-administered medications for this visit.    Family History  Problem Relation Age of Onset  . Diabetes Mother   . Hypertension Mother     Under control  . Diabetes Maternal Aunt   . Seizures Maternal Aunt   . Diabetes Maternal Uncle   . Diabetes Maternal Grandmother   . Hypertension Maternal Grandmother   . Hypertension Other   . Obesity Other   .  Alcohol abuse Father   . Asthma Sister   . Hypertension Sister   . COPD Neg Hx   . Hypertension Sister     ROS:  Pertinent items are noted in HPI.  Otherwise, a comprehensive ROS was negative.  Exam:   BP 172/100 mmHg  Pulse 64  Resp 14  Ht 5' 3.5" (1.613 m)  Wt 266 lb 3.2 oz (120.748 kg)  BMI 46.41 kg/m2  LMP 01/14/2015 (Exact Date)    General appearance: alert, cooperative and appears stated age Head: Normocephalic, without obvious abnormality, atraumatic Neck: no adenopathy, supple, symmetrical, trachea midline and thyroid normal to inspection and palpation Lungs: clear to auscultation bilaterally Breasts: normal appearance, no masses or tenderness, Inspection negative, No nipple retraction or dimpling, No nipple discharge or bleeding, No axillary or supraclavicular adenopathy Heart: regularly  irregular.  No murmur.  Abdomen: soft, non-tender; bowel sounds normal; no masses,  no organomegaly Extremities: extremities normal, atraumatic, no cyanosis or edema Skin: Skin color, texture, turgor normal. No rashes or lesions Lymph nodes: Cervical, supraclavicular, and axillary nodes normal. No abnormal inguinal nodes palpated Neurologic: Grossly normal  Pelvic: External genitalia:  no lesions              Urethra:  normal appearing urethra with no masses, tenderness or lesions              Bartholins and Skenes: normal                 Vagina: normal appearing vagina with normal color and discharge, no lesions              Cervix: no lesions              Pap taken: Yes.   Bimanual Exam:  Uterus:  normal size, contour, position, consistency, mobility, non-tender              Adnexa: normal adnexa and no mass, fullness, tenderness              Rectovaginal: No..  Confirms.              Anus:  normal sphincter tone, no lesions  Chaperone was present for exam.  Assessment:   Well woman visit with normal exam. HTN.  Not controlled on Metolprolol. Cardiac arrhythmia? Hx ASCUS and positive HR HPV. Desire for future pregnancy.  HSV.  Plan: Yearly mammogram recommended after age 87.  Recommended self breast exam.  Pap and HR HPV as above. Recommended Calcium, Vitamin D, regular exercise program including cardiovascular and weight bearing exercise. Labs performed.  Yes.  .   See orders.  STD screening, TFTs, Rubella titer. Refills given on medications.  Yes.  .  See orders.  Valtrex. Pregnancy prevention discussed while getting blood pressure under control. Condoms and spermicide. Start PNV. Prenatal counseling given.  Information for weight loss program through [facility] [address].  Discussed the importance of weight loss for pregnancy planning.  See PCP for HTN and cardiac arrhythmia today.  Already has appointment.  Flu vaccine recommended.  Follow up annually and prn.     After  visit summary provided.

## 2015-02-06 NOTE — Progress Notes (Signed)
Patient presents to clinic today c/o elevated BP noted at appointment GYN appointment this morning. Endorses BP measurements at 172/100 and 162/100. Patient denies chest pain, palpitations, lightheadedness, dizziness, vision changes or frequent headaches. Endorses some mild swelling of ankles and feet.  Past Medical History  Diagnosis Date  . Morbid obesity (HCC)     BMI 47.9  . Bronchitis     chronic - has flare every December  . Hypertension   . GERD (gastroesophageal reflux disease)     Per MD chart note 12/01/2010  . Persistent headaches     worst during menstrual cycle  . Genital warts   . Environmental allergies   . Anxiety   . Cervical polyp     hx of  . Hx of abnormal Pap smear 2012  . STD (sexually transmitted disease)     hx genital warts/?HSV    Current Outpatient Prescriptions on File Prior to Visit  Medication Sig Dispense Refill  . cetirizine (ZYRTEC) 10 MG tablet Take 1 tablet (10 mg total) by mouth daily. 90 tablet 3  . metoprolol succinate (TOPROL-XL) 50 MG 24 hr tablet Take 1 tablet (50 mg total) by mouth daily. Take with or immediately following a meal. 90 tablet 3  . omeprazole (PRILOSEC) 20 MG capsule Take 1 capsule (20 mg total) by mouth daily. 30 capsule 5  . Prenatal Vit-Fe Fumarate-FA (MULTIVITAMIN-PRENATAL) 27-0.8 MG TABS tablet Take 1 tablet by mouth daily at 12 noon.    . sodium chloride (OCEAN) 0.65 % SOLN nasal spray Place 1 spray into both nostrils as needed for congestion.    . valACYclovir (VALTREX) 500 MG tablet Take 1 tablet (500 mg) once daily. (Patient taking differently: Take by mouth daily as needed. Take 1 tablet (500 mg) once daily.) 30 tablet 12   No current facility-administered medications on file prior to visit.    Allergies  Allergen Reactions  . Neosporin [Neomycin-Bacitracin Zn-Polymyx] Anaphylaxis  . Penicillins Rash and Other (See Comments)    Fever    Family History  Problem Relation Age of Onset  . Diabetes Mother     . Hypertension Mother     Under control  . Diabetes Maternal Aunt   . Seizures Maternal Aunt   . Diabetes Maternal Uncle   . Diabetes Maternal Grandmother   . Hypertension Maternal Grandmother   . Hypertension Other   . Obesity Other   . Alcohol abuse Father   . Asthma Sister   . Hypertension Sister   . COPD Neg Hx   . Hypertension Sister     Social History   Social History  . Marital Status: Single    Spouse Name: N/A  . Number of Children: 0  . Years of Education: N/A   Occupational History  .  [employer]   Social History Main Topics  . Smoking status: Never Smoker   . Smokeless tobacco: Never Used  . Alcohol Use: 0.6 oz/week    1 Standard drinks or equivalent per week     Comment: Occasionally; less than 1-2 a week  . Drug Use: No  . Sexual Activity:    Partners: Male    Birth Control/ Protection: Condom     Comment: condoms sometimes   Other Topics Concern  . None   Social History Narrative   Exercise--  2-3 x a week for about 30 min- 60 min   Review of Systems - See HPI.  All other ROS are negative.  BP 164/102  mmHg  Pulse 83  Temp(Src) 97.6 F (36.4 C) (Oral)  Resp 16  Ht 5' 3.5" (1.613 m)  Wt 263 lb (119.296 kg)  BMI 45.85 kg/m2  SpO2 100%  LMP 01/14/2015 (Exact Date)  Physical Exam  Constitutional: She is oriented to person, place, and time and well-developed, well-nourished, and in no distress.  HENT:  Head: Normocephalic and atraumatic.  Eyes: Conjunctivae are normal.  Cardiovascular: Normal rate, S1 normal and S2 normal.   Occasional extrasystoles are present.  No murmur heard. Pulmonary/Chest: Effort normal and breath sounds normal. No respiratory distress. She has no wheezes. She has no rales. She exhibits no tenderness.  Neurological: She is alert and oriented to person, place, and time.  Skin: Skin is warm and dry. No rash noted.  Psychiatric: Affect normal.  Vitals reviewed.   Recent Results (from the past 2160  hour(s))  POCT Urinalysis Dipstick     Status: Normal   Collection Time: 02/06/15  9:47 AM  Result Value Ref Range   Color, UA yellow    Clarity, UA clear    Glucose, UA n    Bilirubin, UA n    Ketones, UA n    Spec Grav, UA     Blood, UA n    pH, UA 5.0    Protein, UA n    Urobilinogen, UA negative    Nitrite, UA n    Leukocytes, UA Negative Negative  Thyroid Panel With TSH     Status: None   Collection Time: 02/06/15 11:04 AM  Result Value Ref Range   T4, Total 7.8 4.5 - 12.0 ug/dL   T3 Uptake 29 22 - 35 %   Free Thyroxine Index 2.3 1.4 - 3.8   TSH 2.342 0.350 - 4.500 uIU/mL  Rubella Antibody, IgM     Status: None (Preliminary result)   Collection Time: 02/06/15 11:04 AM  Result Value Ref Range   Rubella IgM    STD Panel (HBSAG,HIV,RPR)     Status: None   Collection Time: 02/06/15 11:04 AM  Result Value Ref Range   Hepatitis B Surface Ag NEGATIVE NEGATIVE   HIV 1&2 Ab, 4th Generation NONREACTIVE NONREACTIVE    Comment:   HIV-1 antigen and HIV-1/HIV-2 antibodies were not detected.  There is no laboratory evidence of HIV infection.   HIV-1/2 Antibody Diff        Not indicated. HIV-1 RNA, Qual TMA          Not indicated.      RPR Ser Ql NON REAC NON REAC    Comment:   PLEASE NOTE: This information has been disclosed to you from records whose confidentiality may be protected by state law. If your state requires such protection, then the state law prohibits you from making any further disclosure of the information without the specific written consent of the person to whom it pertains, or as otherwise permitted by law. A general authorization for the release of medical or other information is NOT sufficient for this purpose.   The performance of this assay has not been clinically validated in patients less than 18 years old.   For additional information please refer to http://education.questdiagnostics.com/faq/FAQ106.  (This link is being provided for  informational/educational purposes only.)     Hepatitis C antibody     Status: None   Collection Time: 02/06/15 11:04 AM  Result Value Ref Range   HCV Ab NEGATIVE NEGATIVE  Pap Test with HP (IPS)     Status: None  Collection Time: 02/06/15 11:04 AM  Result Value Ref Range   COMMENTS: [facility]     Comment: [address] [place], [place], [place] [address] [address] [place], [place] [address] GYN CYTOLOGY REPORT  PATIENT NAME:Erica Ramos, Erica Ramos PATHOLOGY#:[id number]: F DOB: [birth year] (Age: 39) MEDICAL RECORD NUMBER:[id number] DOCTOR:Brook Edward Jolly, MD DATE OBTAINED:10/7/2016CLIENT:[facility] DATE RECEIVED:10/10/2016OTHER PHYS: DATE SIGNED:02/10/2015 PAP Thinlayer with HPV Final Cytologic Interpretation:       Cervical, ThinLayer with Automated Imaging and Dual Review, CPT 88175      Negative for Intraepithelial Lesions or Malignancy.       ADEQUACY OF SPECIMEN:           Satisfactory for evaluation. Endocervical cells/transformation zone component identified.              OTHER CYTOLOGIC FINDINGS:            Shift in flora suggestive of bacterial vaginosis.       NOTE: This Pap test has been evaluated with computer assisted technology.       Electronically signed by: [facility], CT(ASCP), [address] [place], [place] [address] (Med. Dir.: Darren Wirthwein)  PAL, CT(ASC P) cmt/10/11/2016The Pap test is a screening mechanism with excellent but not perfect ability to prevent cervical carcinoma.  It has a low, but significant, diagnostic error rate. The pap test is suboptimal  for detection of glandular lesions.   It should be noted that a negative result does not definitively rule out the presence of disease.Ref: DeMay, RM, The Art and Science of Cytopathology, [employer], [phone]. HPV Results   High Risk HPV -    Not Detected  A result of "Detected" signifies the presence of one or more high risk types of HPV.  The APTIMA HPV Assay is an in vitro nucleic acid  amplification test for the qualitative detection of E6/E7 viral messenger RNA (mRNA) from 14 high-risk types of  human papillomavirus (HPV) in cervical specimens. The high-risk HPV types detected by the assay include: 16, 18, 31, 33, 35, 39, 45, 51, 52, 56, 58, 59, 66, and 68. APTIMA HPV method will be performed on the [employer].  The APTIMA HPV Assay is designed to en hance existing methods for the detection of cervical disease and should be used in conjunction with clinical information derived from other diagnostic and screening tests, physical examinations, and full medical  history in accordance with appropriate patient management procedures. The APTIMA HPV Assay on ThinPrep(tm) PreservCyt(tm) specimens is FDA approved on the [employer].The APTIMA HPV Assay on SurePath(tm) specimens was developed and its performance characteristics determined by [facility].   It has not been cleared or approved by the U.S. Food and Drug Administration.  The FDA has determined that such clearance or approval is not necessary.  This test is used for clinical purposes.  This laboratory is certified under the Clinical Laboratory  improvement Amendments of 1988 (CLIA) as qualified to perform high complexity clinical laboratory testing. Electronically signed by: Ottis Stain, MT(ASCP) [address], [place], [place] (Med. Dir.: Darren Maureen Chatters) Last Menstrual Period: 01/14/2015  Other Clinical Conditions: Previous abnormal Pap test(s) or biopsy within the past 3 years: ASCUS   GC/chlamydia probe amp, urine     Status: None   Collection Time: 02/06/15 11:08 AM  Result Value Ref Range   Chlamydia, Swab/Urine, PCR NEGATIVE NEGATIVE    Comment:                      **  Normal Reference Range: Negative**          Assay performed using the Gen-Probe APTIMA COMBO2 (R) Assay.      GC Probe Amp, Urine NEGATIVE NEGATIVE    Comment:                      **Normal  Reference Range: Negative**          Assay performed using the Gen-Probe APTIMA COMBO2 (R) Assay.     Basic Metabolic Panel (BMET)     Status: None   Collection Time: 02/06/15  3:47 PM  Result Value Ref Range   Sodium 141 135 - 146 mmol/L   Potassium 3.9 3.5 - 5.3 mmol/L   Chloride 106 98 - 110 mmol/L   CO2 22 20 - 31 mmol/L   Glucose, Bld 76 65 - 99 mg/dL   BUN 9 7 - 25 mg/dL   Creat 1.61 0.96 - 0.45 mg/dL   Calcium 9.2 8.6 - 40.9 mg/dL    Assessment/Plan: HTN (hypertension) EKG reveals NSR with potential RAE. Will add on HCTZ 12.5 mg daily in addition to patient Toprol-XL. Cardiology referral placed due to EKG abnormalities and extrasystole. Follow-up 1 week for BP assessment. Alarm signs/symptoms discussed with patient.

## 2015-02-06 NOTE — Addendum Note (Signed)
Addended by: Francee Piccolo C on: 02/06/2015 11:06 AM   Modules accepted: SmartSet

## 2015-02-06 NOTE — Progress Notes (Signed)
Pre visit review using our clinic review tool, if applicable. No additional management support is needed unless otherwise documented below in the visit note/SLS  

## 2015-02-06 NOTE — Telephone Encounter (Signed)
Medications were sent to the [facility].

## 2015-02-06 NOTE — Patient Instructions (Addendum)
Please stop by the lab for blood work. I will call you with your results.  Please continue the Toprol but add-on the Hydrochlorothiazide.  Follow diet instructions below.  Follow-up 2 weeks. You will be contacted by Cardiology for assessment.  If anything were to worsen, please return to office or go to the ER.  DASH Eating Plan DASH stands for "Dietary Approaches to Stop Hypertension." The DASH eating plan is a healthy eating plan that has been shown to reduce high blood pressure (hypertension). Additional health benefits may include reducing the risk of type 2 diabetes mellitus, heart disease, and stroke. The DASH eating plan may also help with weight loss. WHAT DO I NEED TO KNOW ABOUT THE DASH EATING PLAN? For the DASH eating plan, you will follow these general guidelines:  Choose foods with a percent daily value for sodium of less than 5% (as listed on the food label).  Use salt-free seasonings or herbs instead of table salt or sea salt.  Check with your health care provider or pharmacist before using salt substitutes.  Eat lower-sodium products, often labeled as "lower sodium" or "no salt added."  Eat fresh foods.  Eat more vegetables, fruits, and low-fat dairy products.  Choose whole grains. Look for the word "whole" as the first word in the ingredient list.  Choose fish and skinless chicken or [place] more often than red meat. Limit fish, poultry, and meat to 6 oz (170 g) each day.  Limit sweets, desserts, sugars, and sugary drinks.  Choose heart-healthy fats.  Limit cheese to 1 oz (28 g) per day.  Eat more home-cooked food and less restaurant, buffet, and fast food.  Limit fried foods.  Cook foods using methods other than frying.  Limit canned vegetables. If you do use them, rinse them well to decrease the sodium.  When eating at a restaurant, ask that your food be prepared with less salt, or no salt if possible. WHAT FOODS CAN I EAT? Seek help from a dietitian  for individual calorie needs. Grains Whole grain or whole wheat bread. Brown rice. Whole grain or whole wheat pasta. Quinoa, bulgur, and whole grain cereals. Low-sodium cereals. Corn or whole wheat flour tortillas. Whole grain cornbread. Whole grain crackers. Low-sodium crackers. Vegetables Fresh or frozen vegetables (raw, steamed, roasted, or grilled). Low-sodium or reduced-sodium tomato and vegetable juices. Low-sodium or reduced-sodium tomato sauce and paste. Low-sodium or reduced-sodium canned vegetables.  Fruits All fresh, canned (in natural juice), or frozen fruits. Meat and Other Protein Products Ground beef (85% or leaner), grass-fed beef, or beef trimmed of fat. Skinless chicken or [place]. Ground chicken or [place]. Pork trimmed of fat. All fish and seafood. Eggs. Dried beans, peas, or lentils. Unsalted nuts and seeds. Unsalted canned beans. Dairy Low-fat dairy products, such as skim or 1% milk, 2% or reduced-fat cheeses, low-fat ricotta or cottage cheese, or plain low-fat yogurt. Low-sodium or reduced-sodium cheeses. Fats and Oils Tub margarines without trans fats. Light or reduced-fat mayonnaise and salad dressings (reduced sodium). Avocado. Safflower, olive, or canola oils. Natural peanut or almond butter. Other Unsalted popcorn and pretzels. The items listed above may not be a complete list of recommended foods or beverages. Contact your dietitian for more options. WHAT FOODS ARE NOT RECOMMENDED? Grains Stayer bread. Trenkamp pasta. Leeman rice. Refined cornbread. Bagels and croissants. Crackers that contain trans fat. Vegetables Creamed or fried vegetables. Vegetables in a cheese sauce. Regular canned vegetables. Regular canned tomato sauce and paste. Regular tomato and vegetable juices. Fruits Dried fruits.  Canned fruit in light or heavy syrup. Fruit juice. Meat and Other Protein Products Fatty cuts of meat. Ribs, chicken wings, bacon, sausage, bologna, salami, chitterlings, fatback,  hot dogs, bratwurst, and packaged luncheon meats. Salted nuts and seeds. Canned beans with salt. Dairy Whole or 2% milk, cream, half-and-half, and cream cheese. Whole-fat or sweetened yogurt. Full-fat cheeses or blue cheese. Nondairy creamers and whipped toppings. Processed cheese, cheese spreads, or cheese curds. Condiments Onion and garlic salt, seasoned salt, table salt, and sea salt. Canned and packaged gravies. Worcestershire sauce. Tartar sauce. Barbecue sauce. Teriyaki sauce. Soy sauce, including reduced sodium. Steak sauce. Fish sauce. Oyster sauce. Cocktail sauce. Horseradish. Ketchup and mustard. Meat flavorings and tenderizers. Bouillon cubes. Hot sauce. Tabasco sauce. Marinades. Taco seasonings. Relishes. Fats and Oils Butter, stick margarine, lard, shortening, ghee, and bacon fat. Coconut, palm kernel, or palm oils. Regular salad dressings. Other Pickles and olives. Salted popcorn and pretzels. The items listed above may not be a complete list of foods and beverages to avoid. Contact your dietitian for more information. WHERE CAN I FIND MORE INFORMATION? National Heart, Lung, and Blood Institute: travelstabloid.com   This information is not intended to replace advice given to you by your health care provider. Make sure you discuss any questions you have with your health care provider.   Document Released: 04/07/2011 Document Revised: 05/09/2014 Document Reviewed: 02/20/2013 Elsevier Interactive Patient Education [employer].

## 2015-02-06 NOTE — Telephone Encounter (Signed)
Caller name: Lanette Relation to pt: self Call back number:[phone] [facility]acy[facility]  Reason for call: Pt was seen today and stated forgot to mention that she would like to have her rx sent to the [facility] pharmacy since she is already here and not the [id number] - [place], [place] - [address] AT [address].

## 2015-02-07 LAB — STD PANEL
HEP B S AG: NEGATIVE
HIV 1&2 Ab, 4th Generation: NONREACTIVE

## 2015-02-07 LAB — GC/CHLAMYDIA PROBE AMP, URINE
CHLAMYDIA, SWAB/URINE, PCR: NEGATIVE
GC PROBE AMP, URINE: NEGATIVE

## 2015-02-07 LAB — HEPATITIS C ANTIBODY: HCV AB: NEGATIVE

## 2015-02-10 LAB — RUBELLA ANTIBODY, IGM: Rubella IgM: 0.41 (ref <0.91)

## 2015-02-10 LAB — IPS PAP TEST WITH HPV

## 2015-02-10 NOTE — Assessment & Plan Note (Signed)
EKG reveals NSR with potential RAE. Will add on HCTZ 12.5 mg daily in addition to patient Toprol-XL. Cardiology referral placed due to EKG abnormalities and extrasystole. Follow-up 1 week for BP assessment. Alarm signs/symptoms discussed with patient.

## 2015-02-13 ENCOUNTER — Telehealth: Payer: Self-pay | Admitting: Emergency Medicine

## 2015-02-13 MED ORDER — METRONIDAZOLE 0.75 % VA GEL
VAGINAL | Status: DC
Start: 2015-02-13 — End: 2015-02-23

## 2015-02-13 NOTE — Telephone Encounter (Signed)
Called [employer] and message from Dr. Edward JollySilva given.  She decided she would like to try Metrogel for treatment of bacterial vaginosis, instructions given. Has had bacterial vaginosis in the past and feels she is experiencing symptoms now. Will call back if Metrogel is too costly for change to oral flagyl.  Patient will call primary care for rubella vaccine and call back as needed.  Routing to provider for final review. Patient agreeable to disposition. Will close encounter.

## 2015-02-13 NOTE — Telephone Encounter (Signed)
-----   Message from Patton SallesBrook E Amundson C Silva, MD sent at 02/11/2015  9:09 AM EDT ----- Please contact patient with results from her visit - STD testing all negative for HIV, syphilis, hep B and C, and gonorrhea and chlamydia. - pap showed no abnormal calls.  It did show signs of bacterial vaginosis.  This does not need to be treated but may be treated with Flagyl 500 mg po bid for 7 days or Metrogel pv at hs for 5 nights if patient is symptomatic.  - She is not immune against [place], the [place] measles.  It is very important that she receive vaccination against this prior to trying for pregnancy as [place] measles can cause serious birth defects to an unborn child.  She will need to receive a vaccine through the health department or her primary care office.  She must use contraception for one month following the vaccination.  - Her thyroid testing was all normal.   Cc- Claudette LawsAmanda Dixon

## 2015-02-16 NOTE — Telephone Encounter (Signed)
Pt was no show 02/06/15 11:00am for acute appt, pt actually came in very late and returned to the office later in the afternoon to see you (late for that appt as well), charge or no charge?

## 2015-02-16 NOTE — Telephone Encounter (Signed)
No charge this once.

## 2015-02-19 NOTE — Progress Notes (Signed)
Cardiology Office Note   Date:  02/20/2015   ID:  Erica Ramos, DOB 08/11/1975, MRN [id number]  PCP:  Erica Freud, DO  Cardiologist:   Erica Hook, MD   Chief Complaint  Patient presents with  . New Evaluation    pt c/o no chest pain no SOB no swelling in legs, had one experienced of dizziness      History of Present Illness: Erica Ramos is a 39 y.o. female with hypertension, anxiety and morbid obesity who presents for an evaluation of an abnormal EKG.  She presented for a primary care appointement on 10/7 and was noted to have ectopy on exam.  EKG showed sinus rhythm at 95 bpm and right atrial enlargement.  She has been working with her primary care provider to get control of her blood pressure.  She was previously treated with lisinopril-HCTZ which she asked to switch due to concern about potential angioedema.  She was started on amlodipine which caused LE edema.  She was then started on metoprolol and HCTZ. Since starting this medication her blood pressure has not been as well controlled.  [name] previously exercise by walking daily but has not been doing so recently.  She moved in September, but before then she was walking in the morning for 2-3K steps and then on Tuesdays and Thursdays did Zumba.  She is ready to start back exercising and has made contact with her walking partner. She notes that she is much more stressed without her morning meditation, prayer and walking.  She struggles with her diet and reports that she eats a lot of fried foods. She's trying to cut back on this as well.   [name] notes that she has a lot of anxiety. She was recommended to start on medication but does not want any medicines for this indication. In the past she declined counseling as well but thinks that she would be interested at this time.  She gets extremely stressed when seeing doctors and reports that other situations make her very anxious as well. She is not grossly  improvement in her symptoms to starting metoprolol. She used to have panic attacks.  She denies lower extremity edema but does have some orthopnea. She sleeps on one pillow but has found herself propping her head up recently. She has no chest pain. She does note palpitations that occur approximately once per day. Usually this happens first thing in the morning or with stressful situations. She does sometimes feel lightheaded or dizzy when she first gets out of bed in the morning but otherwise doesn't have the symptoms throughout the day. The palpitations and lightheadedness have been new in the last recent month. She denies PND. She has no family history of heart disease.    Past Medical History  Diagnosis Date  . Morbid obesity (HCC)     BMI 47.9  . Bronchitis     chronic - has flare every December  . Hypertension   . GERD (gastroesophageal reflux disease)     Per MD chart note 12/01/2010  . Persistent headaches     worst during menstrual cycle  . Genital warts   . Environmental allergies   . Anxiety   . Cervical polyp     hx of  . Hx of abnormal Pap smear 2012  . STD (sexually transmitted disease)     hx genital warts/?HSV    Past Surgical History  Procedure Laterality Date  . Wisdom tooth extraction    .  Colposcopy  2012    no treatment to cervix--pap smears reverted to normal     Current Outpatient Prescriptions  Medication Sig Dispense Refill  . cetirizine (ZYRTEC) 10 MG tablet Take 1 tablet (10 mg total) by mouth daily. 90 tablet 3  . Garlic 10 MG CAPS Take by mouth daily.    . hydrochlorothiazide (HYDRODIURIL) 12.5 MG tablet Take 1 tablet (12.5 mg total) by mouth daily. 30 tablet 3  . metoprolol succinate (TOPROL-XL) 100 MG 24 hr tablet Take 1 tablet (100 mg total) by mouth daily. Take with or immediately following a meal. 90 tablet 3  . metroNIDAZOLE (METROGEL) 0.75 % vaginal gel Place one applicator full PV at hs for 5 nights. 70 g 0  . omega-3 fish oil (MAXEPA) 1000  MG CAPS capsule Take by mouth daily.    Marland Kitchen omeprazole (PRILOSEC) 20 MG capsule Take 1 capsule (20 mg total) by mouth daily. 30 capsule 5  . Prenatal Vit-Fe Fumarate-FA (MULTIVITAMIN-PRENATAL) 27-0.8 MG TABS tablet Take 1 tablet by mouth daily at 12 noon.    . sodium chloride (OCEAN) 0.65 % SOLN nasal spray Place 1 spray into both nostrils as needed for congestion.    . valACYclovir (VALTREX) 500 MG tablet Take 1 tablet (500 mg) once daily. (Patient taking differently: Take by mouth daily as needed. Take 1 tablet (500 mg) once daily.) 30 tablet 12   No current facility-administered medications for this visit.    Allergies:   Neosporin and Penicillins    Social History:  The patient  reports that she has never smoked. She has never used smokeless tobacco. She reports that she drinks about 0.6 oz of alcohol per week. She reports that she does not use illicit drugs.   Family History:  The patient's family history includes Alcohol abuse in her father; Asthma in her sister; Diabetes in her maternal aunt, maternal grandmother, maternal uncle, and mother; Hypertension in her maternal grandmother, mother, other, sister, and sister; Obesity in her other; Seizures in her maternal aunt. There is no history of COPD.    ROS:  Please see the history of present illness.   Otherwise, review of systems are positive for none.   All other systems are reviewed and negative.    PHYSICAL EXAM: VS:  BP 158/90 mmHg  Ht 5\' 2"  (1.575 m)  Wt 117.981 kg (260 lb 1.6 oz)  BMI 47.56 kg/m2  LMP 01/14/2015 (Exact Date) , BMI Body mass index is 47.56 kg/(m^2). GENERAL:  Well appearing.  Extremely anxious and tearful during interview. HEENT:  Pupils equal round and reactive, fundi not visualized, oral mucosa unremarkable NECK:  No jugular venous distention, waveform within normal limits, carotid upstroke brisk and symmetric, no bruits, no thyromegaly LYMPHATICS:  No cervical adenopathy LUNGS:  Clear to auscultation  bilaterally HEART:  Regularly irregular.  PMI not displaced or sustained,S1 and S2 within normal limits, no S3, no S4, no clicks, no rubs, II/VII systolic murmur at the RUSB. ABD:  Flat, positive bowel sounds normal in frequency in pitch, no bruits, no rebound, no guarding, no midline pulsatile mass, no hepatomegaly, no splenomegaly EXT:  2 plus pulses throughout, no edema, no cyanosis no clubbing SKIN:  No rashes no nodules NEURO:  Cranial nerves II through XII grossly intact, motor grossly intact throughout PSYCH:  Cognitively intact, oriented to person place and time    EKG:  EKG is ordered today. The ekg ordered today demonstrates sinus arrhythmia and one PVC. Rhythm strip: Ventricular bigeminy  Recent Labs: 10/08/2014: Hemoglobin 12.6; Platelets 384 10/30/2014: ALT 15 02/06/2015: BUN 9; Creat 0.68; Potassium 3.9; Sodium 141; TSH 2.342    Lipid Panel    Component Value Date/Time   CHOL 144 10/30/2014 1050   TRIG 52.0 10/30/2014 1050   HDL 44.50 10/30/2014 1050   CHOLHDL 3 10/30/2014 1050   VLDL 10.4 10/30/2014 1050   LDLCALC 89 10/30/2014 1050      Wt Readings from Last 3 Encounters:  02/20/15 117.981 kg (260 lb 1.6 oz)  02/06/15 119.296 kg (263 lb)  02/06/15 120.748 kg (266 lb 3.2 oz)      ASSESSMENT AND PLAN:  # Palpitations/PVCs: Ms. Steuer was noted to have ventricular bigeminy on rhythm strip today.  Her thyroid function and electrolytes were normal when checked in October and she was not anemic in June. We will obtain a 48-hour Holter to assess her arrhythmia burden. I will also obtain an echo to determine whether this tachyarrhythmia has caused any heart failure. She does not have exertional symptoms that would be concerning for coronary disease and she is only 39 years old. Therefore we will not refer her for stress testing at this time. If her ejection fraction is reduced we will consider this. Given that her blood pressure is still poorly controlled we will double  her metoprolol to 100 mg, which will hopefully suppress her PVCs as well as better control her blood pressure.  # Murmur: Will obtain an echo. In the past her EKG has been concerning for right atrial enlargement, which would be concerning for an ASD. This is not seen on her EKG today. Her murmur is not consistent with an ASD and she does not have any evidence of right heart failure on exam.  # Morbid obesity: BMI 47.5.  We discussed the need for her to increase her exercise to 30-40 minutes of aerobic activity most days of the week. She has plans to start back doing this and has a plan for improving her diet as well.  # Hypertension: Blood pressure is not well controlled today. We are increasing her metoprolol to 100 mg as above. She had better blood pressure control on lisinopril but was concerned about potential side effects. She could be considered for an ARB, as she did not tolerate amlodipine. For now I think that increasing her metoprolol is the best idea as this may help with her anxiety as well as her PVCs as above.  # Anxiety: Ms. Vitatoe is extremely anxious. I suggested that seen a therapist would be a good idea. Ideally she should be on both therapy and pharmacologic intervention, though she is leery of starting any medications at this time. She is accepting of seeing a therapist and will discuss this with Dr. Laury Axon.   Current medicines are reviewed at length with the patient today.  The patient does not have concerns regarding medicines.  The following changes have been made:  Increase metoprolol to 100 mg daily.  Labs/ tests ordered today include:   Orders Placed This Encounter  Procedures  . Holter monitor - 48 hour  . ECHOCARDIOGRAM COMPLETE     Disposition:   FU with Caellum Mancil C. Duke Salvia, MD in 3 months.   Signed, Erica Hook, MD  02/20/2015 1:14 PM    [facility]

## 2015-02-20 ENCOUNTER — Encounter: Payer: Self-pay | Admitting: Cardiovascular Disease

## 2015-02-20 ENCOUNTER — Ambulatory Visit (INDEPENDENT_AMBULATORY_CARE_PROVIDER_SITE_OTHER): Payer: 59 | Admitting: Cardiovascular Disease

## 2015-02-20 VITALS — BP 158/90 | Ht 62.0 in | Wt 260.1 lb

## 2015-02-20 DIAGNOSIS — I493 Ventricular premature depolarization: Secondary | ICD-10-CM

## 2015-02-20 DIAGNOSIS — I517 Cardiomegaly: Secondary | ICD-10-CM

## 2015-02-20 DIAGNOSIS — I10 Essential (primary) hypertension: Secondary | ICD-10-CM

## 2015-02-20 DIAGNOSIS — R011 Cardiac murmur, unspecified: Secondary | ICD-10-CM

## 2015-02-20 HISTORY — DX: Ventricular premature depolarization: I49.3

## 2015-02-20 MED ORDER — METOPROLOL SUCCINATE ER 100 MG PO TB24: 100.0000 mg | ORAL_TABLET | Freq: Every day | ORAL | Status: DC

## 2015-02-20 NOTE — Patient Instructions (Addendum)
Medication Instructions:  INCREASE Metoprolol to 100 mg. You may take 2 tablets of your current prescription until it runs out. A new prescription has been sent to your pharmacy electronically.  Labwork: NONE  Testing/Procedures: Your physician has requested that you have an echocardiogram. Echocardiography is a painless test that uses sound waves to create images of your heart. It provides your doctor with information about the size and shape of your heart and how well your heart's chambers and valves are working. This procedure takes approximately one hour. There are no restrictions for this procedure. This test will be performed at our [facility] location. The address is [address], [address].  Your physician has recommended that you wear a holter monitor. Holter monitors are medical devices that record the heart's electrical activity. Doctors most often use these monitors to diagnose arrhythmias. Arrhythmias are problems with the speed or rhythm of the heartbeat. The monitor is a small, portable device. You can wear one while you do your normal daily activities. This is usually used to diagnose what is causing palpitations/syncope (passing out). [facility] the same day as ultrasound.  Follow-Up: Dr Duke Salviaandolph recommends that you schedule a follow-up appointment in 3 months.  If you need a refill on your cardiac medications before your next appointment, please call your pharmacy.

## 2015-02-23 ENCOUNTER — Ambulatory Visit (INDEPENDENT_AMBULATORY_CARE_PROVIDER_SITE_OTHER): Payer: 59 | Admitting: Family Medicine

## 2015-02-23 ENCOUNTER — Encounter: Payer: Self-pay | Admitting: Family Medicine

## 2015-02-23 VITALS — BP 160/100 | HR 86 | Temp 98.3°F | Ht 64.0 in | Wt 260.4 lb

## 2015-02-23 DIAGNOSIS — J302 Other seasonal allergic rhinitis: Secondary | ICD-10-CM

## 2015-02-23 DIAGNOSIS — I10 Essential (primary) hypertension: Secondary | ICD-10-CM | POA: Diagnosis not present

## 2015-02-23 MED ORDER — LOSARTAN POTASSIUM-HCTZ 50-12.5 MG PO TABS: 1.0000 | ORAL_TABLET | Freq: Every day | ORAL | Status: DC

## 2015-02-23 MED ORDER — TRIAMCINOLONE ACETONIDE 55 MCG/ACT NA AERO: 2.0000 | INHALATION_SPRAY | Freq: Every day | NASAL | Status: DC

## 2015-02-23 MED ORDER — LEVOCETIRIZINE DIHYDROCHLORIDE 5 MG PO TABS: 5.0000 mg | ORAL_TABLET | Freq: Every evening | ORAL | Status: DC

## 2015-02-23 MED ORDER — LIRAGLUTIDE -WEIGHT MANAGEMENT 18 MG/3ML ~~LOC~~ SOPN: 3.0000 mg | PEN_INJECTOR | Freq: Every day | SUBCUTANEOUS | Status: DC

## 2015-02-23 NOTE — Progress Notes (Signed)
Patient ID: Erica Ramos, female    DOB: Jul 10, 1975  Age: 39 y.o. MRN: [id number]    Subjective:  Subjective HPI Erica Ramos presents for f/u bp.   It has been running high.  No headaches or cp.  + palpitations.  Pt saw cardiology on Friday and metoprolol was doubled.  Pt is also struggling with weight loss.  She has just started back at gym.    Review of Systems  Constitutional: Negative for diaphoresis, appetite change, fatigue and unexpected weight change.  Eyes: Negative for pain, redness and visual disturbance.  Respiratory: Negative for cough, chest tightness, shortness of breath and wheezing.   Cardiovascular: Negative for chest pain, palpitations and leg swelling.  Endocrine: Negative for cold intolerance, heat intolerance, polydipsia, polyphagia and polyuria.  Genitourinary: Negative for dysuria, frequency and difficulty urinating.  Neurological: Negative for dizziness, light-headedness, numbness and headaches.  All other systems reviewed and are negative.   History Past Medical History  Diagnosis Date  . Morbid obesity (HCC)     BMI 47.9  . Bronchitis     chronic - has flare every December  . Hypertension   . GERD (gastroesophageal reflux disease)     Per MD chart note 12/01/2010  . Persistent headaches     worst during menstrual cycle  . Genital warts   . Environmental allergies   . Anxiety   . Cervical polyp     hx of  . Hx of abnormal Pap smear 2012  . STD (sexually transmitted disease)     hx genital warts/?HSV  . PVC (premature ventricular contraction) 02/20/2015    She has past surgical history that includes Wisdom tooth extraction and Colposcopy (2012).   Her family history includes Alcohol abuse in her father; Asthma in her sister; Diabetes in her maternal aunt, maternal grandmother, maternal uncle, and mother; Hypertension in her maternal grandmother, mother, other, sister, and sister; Obesity in her other; Seizures in her maternal aunt. There is  no history of COPD.She reports that she has never smoked. She has never used smokeless tobacco. She reports that she drinks about 0.6 oz of alcohol per week. She reports that she does not use illicit drugs.  Current Outpatient Prescriptions on File Prior to Visit  Medication Sig Dispense Refill  . cetirizine (ZYRTEC) 10 MG tablet Take 1 tablet (10 mg total) by mouth daily. 90 tablet 3  . Garlic 10 MG CAPS Take by mouth daily.    . metoprolol succinate (TOPROL-XL) 100 MG 24 hr tablet Take 1 tablet (100 mg total) by mouth daily. Take with or immediately following a meal. 90 tablet 3  . omega-3 fish oil (MAXEPA) 1000 MG CAPS capsule Take by mouth daily.    Marland Kitchen omeprazole (PRILOSEC) 20 MG capsule Take 1 capsule (20 mg total) by mouth daily. 30 capsule 5  . Prenatal Vit-Fe Fumarate-FA (MULTIVITAMIN-PRENATAL) 27-0.8 MG TABS tablet Take 1 tablet by mouth daily at 12 noon.    . sodium chloride (OCEAN) 0.65 % SOLN nasal spray Place 1 spray into both nostrils as needed for congestion.    . valACYclovir (VALTREX) 500 MG tablet Take 1 tablet (500 mg) once daily. (Patient taking differently: Take by mouth daily as needed. Take 1 tablet (500 mg) once daily.) 30 tablet 12   No current facility-administered medications on file prior to visit.     Objective:  Objective Physical Exam  Constitutional: She is oriented to person, place, and time. She appears well-developed and well-nourished.  HENT:  Head: Normocephalic and atraumatic.  Eyes: Conjunctivae and EOM are normal.  Neck: Normal range of motion. Neck supple. No JVD present. Carotid bruit is not present. No thyromegaly present.  Cardiovascular: Normal rate, regular rhythm and normal heart sounds.   No murmur heard. Pulmonary/Chest: Effort normal and breath sounds normal. No respiratory distress. She has no wheezes. She has no rales. She exhibits no tenderness.  Musculoskeletal: She exhibits no edema.  Neurological: She is alert and oriented to person,  place, and time.  Psychiatric: She has a normal mood and affect. Her behavior is normal.  Nursing note and vitals reviewed.  BP 160/100 mmHg  Pulse 86  Temp(Src) 98.3 F (36.8 C) (Oral)  Ht  (1.626 m)  Wt 260 lb 6.4 oz (118.117 kg)  BMI 44.68 kg/m2  SpO2 99%  LMP 01/14/2015 (Exact Date) Wt Readings from Last 3 Encounters:  02/23/15 260 lb 6.4 oz (118.117 kg)  02/20/15 260 lb 1.6 oz (117.981 kg)  02/06/15 263 lb (119.296 kg)     Lab Results  Component Value Date   WBC 14.6* 10/08/2014   HGB 12.6 10/08/2014   HCT 37.6 10/08/2014   PLT 384 10/08/2014   GLUCOSE 76 02/06/2015   CHOL 144 10/30/2014   TRIG 52.0 10/30/2014   HDL 44.50 10/30/2014   LDLCALC 89 10/30/2014   ALT 15 10/30/2014   AST 19 10/30/2014   NA 141 02/06/2015   K 3.9 02/06/2015   CL 106 02/06/2015   CREATININE 0.68 02/06/2015   BUN 9 02/06/2015   CO2 22 02/06/2015   TSH 2.342 02/06/2015   HGBA1C 5.7 10/30/2014   MICROALBUR <0.7 10/30/2014    Dg Lumbar Spine Complete  08/09/2013  CLINICAL DATA:  Low back pain with right-sided radicular symptoms EXAM: LUMBAR SPINE - COMPLETE 4+ VIEW COMPARISON:  None. FINDINGS: Frontal, lateral, spot lumbosacral lateral, and bilateral oblique views were obtained. There are 5 non-rib-bearing lumbar type vertebral bodies. There is lumbar levoscoliosis. There is no fracture or spondylolisthesis. Disc spaces appear intact. There is no appreciable facet arthropathy. IMPRESSION: Scoliosis.  No fracture or appreciable arthropathy. Electronically Signed   By: Bretta Bang M.D.   On: 08/09/2013 13:17     Assessment & Plan:  Plan I have discontinued Erica Ramos's hydrochlorothiazide and metroNIDAZOLE. I am also having her start on Liraglutide -Weight Management, triamcinolone, and levocetirizine. Additionally, I am having her maintain her sodium chloride, cetirizine, omeprazole, multivitamin-prenatal, valACYclovir, Garlic, omega-3 fish oil, metoprolol succinate, and  losartan-hydrochlorothiazide.  Meds ordered this encounter  Medications  . DISCONTD: losartan-hydrochlorothiazide (HYZAAR) 50-12.5 MG tablet    Sig: Take 1 tablet by mouth daily.    Dispense:  30 tablet    Refill:  2  . Liraglutide -Weight Management (SAXENDA) 18 MG/3ML SOPN    Sig: Inject 3 mg into the skin daily.    Dispense:  3 mL    Refill:  3  . triamcinolone (NASACORT AQ) 55 MCG/ACT AERO nasal inhaler    Sig: Place 2 sprays into the nose daily.    Dispense:  1 Inhaler    Refill:  12  . levocetirizine (XYZAL) 5 MG tablet    Sig: Take 1 tablet (5 mg total) by mouth every evening.    Dispense:  30 tablet    Refill:  5  . losartan-hydrochlorothiazide (HYZAAR) 50-12.5 MG tablet    Sig: Take 1 tablet by mouth daily.    Dispense:  30 tablet    Refill:  2    Problem  List Items Addressed This Visit    Seasonal allergic rhinitis   Relevant Medications   triamcinolone (NASACORT AQ) 55 MCG/ACT AERO nasal inhaler   levocetirizine (XYZAL) 5 MG tablet   Obesity, morbid (HCC)    D/w diet and exercise saxenda 0.6 mg sq qd ---- increase by 0.6 weekly until max 3.0 rto 1 month or sooner prn      Relevant Medications   Liraglutide -Weight Management (SAXENDA) 18 MG/3ML SOPN   HTN (hypertension) - Primary    con't metoprolol Add losartan rto 2-3 weeks      Relevant Medications   Liraglutide -Weight Management (SAXENDA) 18 MG/3ML SOPN   losartan-hydrochlorothiazide (HYZAAR) 50-12.5 MG tablet      Follow-up: Return in about 3 weeks (around 03/16/2015), or if symptoms worsen or fail to improve.  Loreen FreudYvonne Lowne, DO

## 2015-02-23 NOTE — Progress Notes (Signed)
Pre visit review using our clinic review tool, if applicable. No additional management support is needed unless otherwise documented below in the visit note. 

## 2015-02-23 NOTE — Patient Instructions (Signed)
Hypertension Hypertension, commonly called high blood pressure, is when the force of blood pumping through your arteries is too strong. Your arteries are the blood vessels that carry blood from your heart throughout your body. A blood pressure reading consists of a higher number over a lower number, such as 110/72. The higher number (systolic) is the pressure inside your arteries when your heart pumps. The lower number (diastolic) is the pressure inside your arteries when your heart relaxes. Ideally you want your blood pressure below 120/80. Hypertension forces your heart to work harder to pump blood. Your arteries may become narrow or stiff. Having untreated or uncontrolled hypertension can cause heart attack, stroke, kidney disease, and other problems. RISK FACTORS Some risk factors for high blood pressure are controllable. Others are not.  Risk factors you cannot control include:   Race. You may be at higher risk if you are African American.  Age. Risk increases with age.  Gender. Men are at higher risk than women before age 45 years. After age 65, women are at higher risk than men. Risk factors you can control include:  Not getting enough exercise or physical activity.  Being overweight.  Getting too much fat, sugar, calories, or salt in your diet.  Drinking too much alcohol. SIGNS AND SYMPTOMS Hypertension does not usually cause signs or symptoms. Extremely high blood pressure (hypertensive crisis) may cause headache, anxiety, shortness of breath, and nosebleed. DIAGNOSIS To check if you have hypertension, your health care provider will measure your blood pressure while you are seated, with your arm held at the level of your heart. It should be measured at least twice using the same arm. Certain conditions can cause a difference in blood pressure between your right and left arms. A blood pressure reading that is higher than normal on one occasion does not mean that you need treatment. If  it is not clear whether you have high blood pressure, you may be asked to return on a different day to have your blood pressure checked again. Or, you may be asked to monitor your blood pressure at home for 1 or more weeks. TREATMENT Treating high blood pressure includes making lifestyle changes and possibly taking medicine. Living a healthy lifestyle can help lower high blood pressure. You may need to change some of your habits. Lifestyle changes may include:  Following the DASH diet. This diet is high in fruits, vegetables, and whole grains. It is low in salt, red meat, and added sugars.  Keep your sodium intake below 2,300 mg per day.  Getting at least 30-45 minutes of aerobic exercise at least 4 times per week.  Losing weight if necessary.  Not smoking.  Limiting alcoholic beverages.  Learning ways to reduce stress. Your health care provider may prescribe medicine if lifestyle changes are not enough to get your blood pressure under control, and if one of the following is true:  You are 18-59 years of age and your systolic blood pressure is above 140.  You are 60 years of age or older, and your systolic blood pressure is above 150.  Your diastolic blood pressure is above 90.  You have diabetes, and your systolic blood pressure is over 140 or your diastolic blood pressure is over 90.  You have kidney disease and your blood pressure is above 140/90.  You have heart disease and your blood pressure is above 140/90. Your personal target blood pressure may vary depending on your medical conditions, your age, and other factors. HOME CARE INSTRUCTIONS    Have your blood pressure rechecked as directed by your health care provider.   Take medicines only as directed by your health care provider. Follow the directions carefully. Blood pressure medicines must be taken as prescribed. The medicine does not work as well when you skip doses. Skipping doses also puts you at risk for  problems.  Do not smoke.   Monitor your blood pressure at home as directed by your health care provider. SEEK MEDICAL CARE IF:   You think you are having a reaction to medicines taken.  You have recurrent headaches or feel dizzy.  You have swelling in your ankles.  You have trouble with your vision. SEEK IMMEDIATE MEDICAL CARE IF:  You develop a severe headache or confusion.  You have unusual weakness, numbness, or feel faint.  You have severe chest or abdominal pain.  You vomit repeatedly.  You have trouble breathing. MAKE SURE YOU:   Understand these instructions.  Will watch your condition.  Will get help right away if you are not doing well or get worse.   This information is not intended to replace advice given to you by your health care provider. Make sure you discuss any questions you have with your health care provider.   Document Released: 04/18/2005 Document Revised: 09/02/2014 Document Reviewed: 02/08/2013 Elsevier Interactive Patient Education 2016 Elsevier Inc.  

## 2015-02-24 ENCOUNTER — Telehealth: Payer: Self-pay

## 2015-02-24 NOTE — Telephone Encounter (Signed)
Spoke with patient and she stated 0.6 mg daily for 1 week and increase 0.6 mg each week until she gets to the 3.0 mg dose. The patient verbalized understanding and she wrote it down and read ut back to me to be sure.      KP

## 2015-02-24 NOTE — Telephone Encounter (Signed)
Patient was not given these directions at her visit. I have tried to contact the patient, but her VM is full.     KP

## 2015-02-24 NOTE — Telephone Encounter (Signed)
To MD.    KP 

## 2015-02-24 NOTE — Telephone Encounter (Signed)
She needs prior auth

## 2015-02-24 NOTE — Telephone Encounter (Signed)
[facility] saxenda but if PCP can call [employer] at [phone] stating that the medication is medical needed then they will cover.

## 2015-02-24 NOTE — Telephone Encounter (Signed)
-----   Message from Lelon PerlaYvonne R Lowne, DO sent at 02/24/2015  9:03 AM EDT ----- If her ins does pay for saxenda-- she needs to start with 0.6 mg daily I'm hoping the pharmacy showed her how to do it-- if not she can come in here and one of the RN can show her

## 2015-02-24 NOTE — Assessment & Plan Note (Signed)
con't metoprolol Add losartan rto 2-3 weeks

## 2015-02-24 NOTE — Assessment & Plan Note (Signed)
D/w diet and exercise saxenda 0.6 mg sq qd ---- increase by 0.6 weekly until max 3.0 rto 1 month or sooner prn

## 2015-02-25 NOTE — Telephone Encounter (Signed)
PA initiated

## 2015-03-03 ENCOUNTER — Other Ambulatory Visit: Payer: Self-pay | Admitting: Family Medicine

## 2015-03-03 ENCOUNTER — Encounter: Payer: Self-pay | Admitting: Family Medicine

## 2015-03-03 ENCOUNTER — Ambulatory Visit (HOSPITAL_COMMUNITY): Payer: 59 | Attending: Cardiovascular Disease

## 2015-03-03 ENCOUNTER — Ambulatory Visit (INDEPENDENT_AMBULATORY_CARE_PROVIDER_SITE_OTHER): Payer: 59

## 2015-03-03 ENCOUNTER — Other Ambulatory Visit: Payer: Self-pay

## 2015-03-03 DIAGNOSIS — I10 Essential (primary) hypertension: Secondary | ICD-10-CM | POA: Insufficient documentation

## 2015-03-03 DIAGNOSIS — I517 Cardiomegaly: Secondary | ICD-10-CM

## 2015-03-03 DIAGNOSIS — Z6841 Body Mass Index (BMI) 40.0 and over, adult: Secondary | ICD-10-CM | POA: Insufficient documentation

## 2015-03-03 DIAGNOSIS — R011 Cardiac murmur, unspecified: Secondary | ICD-10-CM | POA: Diagnosis present

## 2015-03-03 DIAGNOSIS — I493 Ventricular premature depolarization: Secondary | ICD-10-CM

## 2015-03-03 NOTE — Telephone Encounter (Signed)
Note done

## 2015-03-03 NOTE — Addendum Note (Signed)
Addended by: Lindell SparELKINS, Justinian Miano M on: 03/03/2015 05:03 PM   Modules accepted: Orders

## 2015-03-03 NOTE — Telephone Encounter (Signed)
PA denied. Insurance needs a letter of medical necessity from provider to appeal decision. Please advise

## 2015-03-04 NOTE — Telephone Encounter (Signed)
Letter faxed to [employer] appeals dept. Awaiting determination. JG//CMA

## 2015-03-06 ENCOUNTER — Telehealth: Payer: Self-pay | Admitting: Family Medicine

## 2015-03-06 NOTE — Telephone Encounter (Signed)
Patient is scheduled to go back to work on Monday and needs work note. Plse adv

## 2015-03-06 NOTE — Telephone Encounter (Signed)
I reprinted the note that Dr.Lowne gave the patient last week. Note left at check in and VM left making the patient aware.   KP

## 2015-03-09 ENCOUNTER — Telehealth: Payer: Self-pay | Admitting: Cardiovascular Disease

## 2015-03-09 ENCOUNTER — Telehealth: Payer: Self-pay | Admitting: Family Medicine

## 2015-03-09 NOTE — Telephone Encounter (Signed)
Work note faxed.    KP

## 2015-03-09 NOTE — Telephone Encounter (Signed)
Received [employer] Disability forms via Fax on 03/09/15.  [facility] [employer] via [employer] for [employer] to sent letter/pkt to patient to obtain PMT and Signed AUTH.  Sent to [employer] on 03/09/15. lp

## 2015-03-09 NOTE — Telephone Encounter (Signed)
Patient request to have Return to work note effective today faxed to her job ASAP   Fax number is [phone]  Patient may be reached at [phone]

## 2015-03-11 ENCOUNTER — Telehealth: Payer: Self-pay

## 2015-03-11 NOTE — Telephone Encounter (Signed)
Patients insurance company called to se when patient was reliesed to go back to work. According to letter in chart advised she was to go back on 03/09/15

## 2015-03-16 ENCOUNTER — Ambulatory Visit: Payer: 59 | Admitting: Family Medicine

## 2015-03-19 ENCOUNTER — Ambulatory Visit (INDEPENDENT_AMBULATORY_CARE_PROVIDER_SITE_OTHER): Payer: 59 | Admitting: Family Medicine

## 2015-03-19 ENCOUNTER — Encounter (INDEPENDENT_AMBULATORY_CARE_PROVIDER_SITE_OTHER): Payer: Self-pay

## 2015-03-19 ENCOUNTER — Encounter: Payer: Self-pay | Admitting: Family Medicine

## 2015-03-19 VITALS — BP 130/80 | HR 81 | Temp 98.3°F | Wt 263.4 lb

## 2015-03-19 DIAGNOSIS — I10 Essential (primary) hypertension: Secondary | ICD-10-CM

## 2015-03-19 DIAGNOSIS — E669 Obesity, unspecified: Secondary | ICD-10-CM | POA: Diagnosis not present

## 2015-03-19 MED ORDER — LOSARTAN POTASSIUM-HCTZ 100-25 MG PO TABS: 1.0000 | ORAL_TABLET | Freq: Every day | ORAL | Status: DC

## 2015-03-19 MED ORDER — NALTREXONE-BUPROPION HCL ER 8-90 MG PO TB12: ORAL_TABLET | ORAL | Status: DC

## 2015-03-19 NOTE — Patient Instructions (Signed)
Hypertension Hypertension, commonly called high blood pressure, is when the force of blood pumping through your arteries is too strong. Your arteries are the blood vessels that carry blood from your heart throughout your body. A blood pressure reading consists of a higher number over a lower number, such as 110/72. The higher number (systolic) is the pressure inside your arteries when your heart pumps. The lower number (diastolic) is the pressure inside your arteries when your heart relaxes. Ideally you want your blood pressure below 120/80. Hypertension forces your heart to work harder to pump blood. Your arteries may become narrow or stiff. Having untreated or uncontrolled hypertension can cause heart attack, stroke, kidney disease, and other problems. RISK FACTORS Some risk factors for high blood pressure are controllable. Others are not.  Risk factors you cannot control include:   Race. You may be at higher risk if you are African American.  Age. Risk increases with age.  Gender. Men are at higher risk than women before age 45 years. After age 65, women are at higher risk than men. Risk factors you can control include:  Not getting enough exercise or physical activity.  Being overweight.  Getting too much fat, sugar, calories, or salt in your diet.  Drinking too much alcohol. SIGNS AND SYMPTOMS Hypertension does not usually cause signs or symptoms. Extremely high blood pressure (hypertensive crisis) may cause headache, anxiety, shortness of breath, and nosebleed. DIAGNOSIS To check if you have hypertension, your health care provider will measure your blood pressure while you are seated, with your arm held at the level of your heart. It should be measured at least twice using the same arm. Certain conditions can cause a difference in blood pressure between your right and left arms. A blood pressure reading that is higher than normal on one occasion does not mean that you need treatment. If  it is not clear whether you have high blood pressure, you may be asked to return on a different day to have your blood pressure checked again. Or, you may be asked to monitor your blood pressure at home for 1 or more weeks. TREATMENT Treating high blood pressure includes making lifestyle changes and possibly taking medicine. Living a healthy lifestyle can help lower high blood pressure. You may need to change some of your habits. Lifestyle changes may include:  Following the DASH diet. This diet is high in fruits, vegetables, and whole grains. It is low in salt, red meat, and added sugars.  Keep your sodium intake below 2,300 mg per day.  Getting at least 30-45 minutes of aerobic exercise at least 4 times per week.  Losing weight if necessary.  Not smoking.  Limiting alcoholic beverages.  Learning ways to reduce stress. Your health care provider may prescribe medicine if lifestyle changes are not enough to get your blood pressure under control, and if one of the following is true:  You are 18-59 years of age and your systolic blood pressure is above 140.  You are 60 years of age or older, and your systolic blood pressure is above 150.  Your diastolic blood pressure is above 90.  You have diabetes, and your systolic blood pressure is over 140 or your diastolic blood pressure is over 90.  You have kidney disease and your blood pressure is above 140/90.  You have heart disease and your blood pressure is above 140/90. Your personal target blood pressure may vary depending on your medical conditions, your age, and other factors. HOME CARE INSTRUCTIONS    Have your blood pressure rechecked as directed by your health care provider.   Take medicines only as directed by your health care provider. Follow the directions carefully. Blood pressure medicines must be taken as prescribed. The medicine does not work as well when you skip doses. Skipping doses also puts you at risk for  problems.  Do not smoke.   Monitor your blood pressure at home as directed by your health care provider. SEEK MEDICAL CARE IF:   You think you are having a reaction to medicines taken.  You have recurrent headaches or feel dizzy.  You have swelling in your ankles.  You have trouble with your vision. SEEK IMMEDIATE MEDICAL CARE IF:  You develop a severe headache or confusion.  You have unusual weakness, numbness, or feel faint.  You have severe chest or abdominal pain.  You vomit repeatedly.  You have trouble breathing. MAKE SURE YOU:   Understand these instructions.  Will watch your condition.  Will get help right away if you are not doing well or get worse.   This information is not intended to replace advice given to you by your health care provider. Make sure you discuss any questions you have with your health care provider.   Document Released: 04/18/2005 Document Revised: 09/02/2014 Document Reviewed: 02/08/2013 Elsevier Interactive Patient Education 2016 Elsevier Inc.  

## 2015-03-19 NOTE — Progress Notes (Signed)
Pre visit review using our clinic review tool, if applicable. No additional management support is needed unless otherwise documented below in the visit note. 

## 2015-03-19 NOTE — Progress Notes (Signed)
Patient ID: Erica EhrichChandra D Ramos, female    DOB: 27-Sep-1975  Age: 39 y.o. MRN: [id number]    Subjective:  Subjective HPI Erica Ramos presents for bp check.  No ha, cp, sob.    Review of Systems  Constitutional: Negative for diaphoresis, appetite change, fatigue and unexpected weight change.  Eyes: Negative for pain, redness and visual disturbance.  Respiratory: Negative for cough, chest tightness, shortness of breath and wheezing.   Cardiovascular: Negative for chest pain, palpitations and leg swelling.  Endocrine: Negative for cold intolerance, heat intolerance, polydipsia, polyphagia and polyuria.  Genitourinary: Negative for dysuria, frequency and difficulty urinating.  Neurological: Negative for dizziness, light-headedness, numbness and headaches.    History Past Medical History  Diagnosis Date  . Morbid obesity (HCC)     BMI 47.9  . Bronchitis     chronic - has flare every December  . Hypertension   . GERD (gastroesophageal reflux disease)     Per MD chart note 12/01/2010  . Persistent headaches     worst during menstrual cycle  . Genital warts   . Environmental allergies   . Anxiety   . Cervical polyp     hx of  . Hx of abnormal Pap smear 2012  . STD (sexually transmitted disease)     hx genital warts/?HSV  . PVC (premature ventricular contraction) 02/20/2015    She has past surgical history that includes Wisdom tooth extraction and Colposcopy (2012).   Her family history includes Alcohol abuse in her father; Asthma in her sister; Diabetes in her maternal aunt, maternal grandmother, maternal uncle, and mother; Hypertension in her maternal grandmother, mother, other, sister, and sister; Obesity in her other; Seizures in her maternal aunt. There is no history of COPD.She reports that she has never smoked. She has never used smokeless tobacco. She reports that she drinks about 0.6 oz of alcohol per week. She reports that she does not use illicit drugs.  Current Outpatient  Prescriptions on File Prior to Visit  Medication Sig Dispense Refill  . cetirizine (ZYRTEC) 10 MG tablet Take 1 tablet (10 mg total) by mouth daily. 90 tablet 3  . Garlic 10 MG CAPS Take by mouth daily.    Marland Kitchen. levocetirizine (XYZAL) 5 MG tablet Take 1 tablet (5 mg total) by mouth every evening. 30 tablet 5  . metoprolol succinate (TOPROL-XL) 100 MG 24 hr tablet Take 1 tablet (100 mg total) by mouth daily. Take with or immediately following a meal. 90 tablet 3  . omega-3 fish oil (MAXEPA) 1000 MG CAPS capsule Take by mouth daily.    Marland Kitchen. omeprazole (PRILOSEC) 20 MG capsule Take 1 capsule (20 mg total) by mouth daily. 30 capsule 5  . Prenatal Vit-Fe Fumarate-FA (MULTIVITAMIN-PRENATAL) 27-0.8 MG TABS tablet Take 1 tablet by mouth daily at 12 noon.    . sodium chloride (OCEAN) 0.65 % SOLN nasal spray Place 1 spray into both nostrils as needed for congestion.    . triamcinolone (NASACORT AQ) 55 MCG/ACT AERO nasal inhaler Place 2 sprays into the nose daily. 1 Inhaler 12  . valACYclovir (VALTREX) 500 MG tablet Take 1 tablet (500 mg) once daily. (Patient taking differently: Take by mouth daily as needed. Take 1 tablet (500 mg) once daily.) 30 tablet 12   No current facility-administered medications on file prior to visit.     Objective:  Objective Physical Exam  Constitutional: She is oriented to person, place, and time. She appears well-developed and well-nourished.  HENT:  Head: Normocephalic and  atraumatic.  Eyes: Conjunctivae and EOM are normal.  Neck: Normal range of motion. Neck supple. No JVD present. Carotid bruit is not present. No thyromegaly present.  Cardiovascular: Normal rate, regular rhythm and normal heart sounds.   No murmur heard. Pulmonary/Chest: Effort normal and breath sounds normal. No respiratory distress. She has no wheezes. She has no rales. She exhibits no tenderness.  Musculoskeletal: She exhibits no edema.  Neurological: She is alert and oriented to person, place, and time.   Psychiatric: She has a normal mood and affect. Her behavior is normal.  Nursing note and vitals reviewed.  BP 130/80 mmHg  Pulse 81  Temp(Src) 98.3 F (36.8 C) (Oral)  Wt 263 lb 6.4 oz (119.477 kg)  SpO2 95% Wt Readings from Last 3 Encounters:  03/19/15 263 lb 6.4 oz (119.477 kg)  02/23/15 260 lb 6.4 oz (118.117 kg)  02/20/15 260 lb 1.6 oz (117.981 kg)     Lab Results  Component Value Date   WBC 14.6* 10/08/2014   HGB 12.6 10/08/2014   HCT 37.6 10/08/2014   PLT 384 10/08/2014   GLUCOSE 76 02/06/2015   CHOL 144 10/30/2014   TRIG 52.0 10/30/2014   HDL 44.50 10/30/2014   LDLCALC 89 10/30/2014   ALT 15 10/30/2014   AST 19 10/30/2014   NA 141 02/06/2015   K 3.9 02/06/2015   CL 106 02/06/2015   CREATININE 0.68 02/06/2015   BUN 9 02/06/2015   CO2 22 02/06/2015   TSH 2.342 02/06/2015   HGBA1C 5.7 10/30/2014   MICROALBUR <0.7 10/30/2014    Dg Lumbar Spine Complete  08/09/2013  CLINICAL DATA:  Low back pain with right-sided radicular symptoms EXAM: LUMBAR SPINE - COMPLETE 4+ VIEW COMPARISON:  None. FINDINGS: Frontal, lateral, spot lumbosacral lateral, and bilateral oblique views were obtained. There are 5 non-rib-bearing lumbar type vertebral bodies. There is lumbar levoscoliosis. There is no fracture or spondylolisthesis. Disc spaces appear intact. There is no appreciable facet arthropathy. IMPRESSION: Scoliosis.  No fracture or appreciable arthropathy. Electronically Signed   By: Bretta Bang M.D.   On: 08/09/2013 13:17     Assessment & Plan:  Plan I have discontinued Erica Ramos's Liraglutide -Weight Management and losartan-hydrochlorothiazide. I am also having her start on losartan-hydrochlorothiazide and Naltrexone-Bupropion HCl ER. Additionally, I am having her maintain her sodium chloride, cetirizine, omeprazole, multivitamin-prenatal, valACYclovir, Garlic, omega-3 fish oil, metoprolol succinate, triamcinolone, and levocetirizine.  Meds ordered this encounter    Medications  . losartan-hydrochlorothiazide (HYZAAR) 100-25 MG tablet    Sig: Take 1 tablet by mouth daily.    Dispense:  90 tablet    Refill:  3  . Naltrexone-Bupropion HCl ER (CONTRAVE) 8-90 MG TB12    Sig: 2 po bid    Dispense:  120 tablet    Refill:  5    Problem List Items Addressed This Visit    None    Visit Diagnoses    Essential hypertension    -  Primary    Relevant Medications    losartan-hydrochlorothiazide (HYZAAR) 100-25 MG tablet    Obesity        Relevant Medications    Naltrexone-Bupropion HCl ER (CONTRAVE) 8-90 MG TB12       Follow-up: Return in about 4 weeks (around 04/16/2015), or if symptoms worsen or fail to improve, for hypertension.  Loreen Freud, DO

## 2015-03-20 NOTE — Assessment & Plan Note (Signed)
Stable con't losartan and metoprolol

## 2015-03-24 ENCOUNTER — Telehealth: Payer: Self-pay | Admitting: *Deleted

## 2015-03-24 NOTE — Telephone Encounter (Signed)
-----   Message from Chilton Siiffany [place], MD sent at 03/24/2015  1:26 PM EST ----- Monitor showed frequent PVCs, or extra beats from the bottom chamber of the heart.  Continue metoprolol as ordered and we will see if it is less frequent at her next appointment.

## 2015-03-24 NOTE — Telephone Encounter (Signed)
Spoke to patient. Result given . Verbalized understanding  

## 2015-03-25 ENCOUNTER — Telehealth: Payer: Self-pay | Admitting: Obstetrics and Gynecology

## 2015-03-25 MED ORDER — METRONIDAZOLE 500 MG PO TABS: 500.0000 mg | ORAL_TABLET | Freq: Two times a day (BID) | ORAL | Status: DC

## 2015-03-25 NOTE — Telephone Encounter (Signed)
Ok to prescribe Flagyl 500mg  bid x 7 days.  Advise no alcohol when she takes this.

## 2015-03-25 NOTE — Telephone Encounter (Signed)
Patient calling wanting to know if Dr. Edward JollySilva can call in a oral pill for bv as opposed to the metrogel that was sent in for her from her AEX visit. Patient says it was called in and she didn't pick it up because it was too expensive so she forgot about. Preferred Pharmacy [facility]  [address] [place] [address] # to reach patient [phone]

## 2015-03-25 NOTE — Telephone Encounter (Signed)
Routing to Dr.Miller for review and advised as Dr.Silva is out of the office today.

## 2015-03-25 NOTE — Telephone Encounter (Signed)
Left detailed message at number provided [phone], okay per ROI. Advised Rx for Flagyl 500 mg bid x 7 days [facility] [address]. ETOH precautions given.  Routing to provider for final review. Patient agreeable to disposition. Will close encounter.

## 2015-04-20 ENCOUNTER — Encounter: Payer: Self-pay | Admitting: Family Medicine

## 2015-04-20 ENCOUNTER — Ambulatory Visit (INDEPENDENT_AMBULATORY_CARE_PROVIDER_SITE_OTHER): Payer: 59 | Admitting: Family Medicine

## 2015-04-20 VITALS — BP 146/88 | HR 82 | Temp 97.7°F | Ht 64.0 in | Wt 264.4 lb

## 2015-04-20 DIAGNOSIS — I10 Essential (primary) hypertension: Secondary | ICD-10-CM | POA: Diagnosis not present

## 2015-04-20 MED ORDER — LOSARTAN POTASSIUM-HCTZ 100-25 MG PO TABS: 1.0000 | ORAL_TABLET | Freq: Every day | ORAL | Status: DC

## 2015-04-20 MED ORDER — METOPROLOL SUCCINATE ER 100 MG PO TB24: 100.0000 mg | ORAL_TABLET | Freq: Every day | ORAL | Status: DC

## 2015-04-20 NOTE — Progress Notes (Signed)
Pre visit review using our clinic review tool, if applicable. No additional management support is needed unless otherwise documented below in the visit note. 

## 2015-04-20 NOTE — Progress Notes (Signed)
Patient ID: Erica Ramos, female    DOB: Aug 09, 1975  Age: 39 y.o. MRN: [id number]    Subjective:  Subjective HPI Erica Ramos presents for f/u bp.  Pt takes meds in am.  She states she is nervous everytime she comes in.  She is not checking her bp at home.   Review of Systems  Constitutional: Negative for diaphoresis, appetite change, fatigue and unexpected weight change.  Eyes: Negative for pain, redness and visual disturbance.  Respiratory: Negative for cough, chest tightness, shortness of breath and wheezing.   Cardiovascular: Negative for chest pain, palpitations and leg swelling.  Endocrine: Negative for cold intolerance, heat intolerance, polydipsia, polyphagia and polyuria.  Genitourinary: Negative for dysuria, frequency and difficulty urinating.  Neurological: Negative for dizziness, light-headedness, numbness and headaches.    History Past Medical History  Diagnosis Date  . Morbid obesity (HCC)     BMI 47.9  . Bronchitis     chronic - has flare every December  . Hypertension   . GERD (gastroesophageal reflux disease)     Per MD chart note 12/01/2010  . Persistent headaches     worst during menstrual cycle  . Genital warts   . Environmental allergies   . Anxiety   . Cervical polyp     hx of  . Hx of abnormal Pap smear 2012  . STD (sexually transmitted disease)     hx genital warts/?HSV  . PVC (premature ventricular contraction) 02/20/2015    She has past surgical history that includes Wisdom tooth extraction and Colposcopy (2012).   Her family history includes Alcohol abuse in her father; Asthma in her sister; Diabetes in her maternal aunt, maternal grandmother, maternal uncle, and mother; Hypertension in her maternal grandmother, mother, other, sister, and sister; Obesity in her other; Seizures in her maternal aunt. There is no history of COPD.She reports that she has never smoked. She has never used smokeless tobacco. She reports that she drinks about 0.6 oz  of alcohol per week. She reports that she does not use illicit drugs.  Current Outpatient Prescriptions on File Prior to Visit  Medication Sig Dispense Refill  . cetirizine (ZYRTEC) 10 MG tablet Take 1 tablet (10 mg total) by mouth daily. 90 tablet 3  . Garlic 10 MG CAPS Take by mouth daily.    Marland Kitchen levocetirizine (XYZAL) 5 MG tablet Take 1 tablet (5 mg total) by mouth every evening. 30 tablet 5  . metroNIDAZOLE (FLAGYL) 500 MG tablet Take 1 tablet (500 mg total) by mouth 2 (two) times daily. 14 tablet 0  . Naltrexone-Bupropion HCl ER (CONTRAVE) 8-90 MG TB12 2 po bid 120 tablet 5  . omega-3 fish oil (MAXEPA) 1000 MG CAPS capsule Take by mouth daily.    Marland Kitchen omeprazole (PRILOSEC) 20 MG capsule Take 1 capsule (20 mg total) by mouth daily. 30 capsule 5  . Prenatal Vit-Fe Fumarate-FA (MULTIVITAMIN-PRENATAL) 27-0.8 MG TABS tablet Take 1 tablet by mouth daily at 12 noon.    . sodium chloride (OCEAN) 0.65 % SOLN nasal spray Place 1 spray into both nostrils as needed for congestion.    . triamcinolone (NASACORT AQ) 55 MCG/ACT AERO nasal inhaler Place 2 sprays into the nose daily. 1 Inhaler 12  . valACYclovir (VALTREX) 500 MG tablet Take 1 tablet (500 mg) once daily. (Patient taking differently: Take by mouth daily as needed. Take 1 tablet (500 mg) once daily.) 30 tablet 12   No current facility-administered medications on file prior to visit.  Objective:  Objective Physical Exam  Constitutional: She is oriented to person, place, and time. She appears well-developed and well-nourished.  HENT:  Head: Normocephalic and atraumatic.  Eyes: Conjunctivae and EOM are normal.  Neck: Normal range of motion. Neck supple. No JVD present. Carotid bruit is not present. No thyromegaly present.  Cardiovascular: Normal rate, regular rhythm and normal heart sounds.   No murmur heard. Pulmonary/Chest: Effort normal and breath sounds normal. No respiratory distress. She has no wheezes. She has no rales. She exhibits  no tenderness.  Musculoskeletal: She exhibits no edema.  Neurological: She is alert and oriented to person, place, and time.  Psychiatric: She has a normal mood and affect. Her behavior is normal.  Nursing note and vitals reviewed.  BP 146/88 mmHg  Pulse 82  Temp(Src) 97.7 F (36.5 C) (Oral)  Ht 5\' 4"  (1.626 m)  Wt 264 lb 6.4 oz (119.931 kg)  BMI 45.36 kg/m2  SpO2 99%  LMP 04/20/2015 Wt Readings from Last 3 Encounters:  04/20/15 264 lb 6.4 oz (119.931 kg)  03/19/15 263 lb 6.4 oz (119.477 kg)  02/23/15 260 lb 6.4 oz (118.117 kg)     Lab Results  Component Value Date   WBC 14.6* 10/08/2014   HGB 12.6 10/08/2014   HCT 37.6 10/08/2014   PLT 384 10/08/2014   GLUCOSE 76 02/06/2015   CHOL 144 10/30/2014   TRIG 52.0 10/30/2014   HDL 44.50 10/30/2014   LDLCALC 89 10/30/2014   ALT 15 10/30/2014   AST 19 10/30/2014   NA 141 02/06/2015   K 3.9 02/06/2015   CL 106 02/06/2015   CREATININE 0.68 02/06/2015   BUN 9 02/06/2015   CO2 22 02/06/2015   TSH 2.342 02/06/2015   HGBA1C 5.7 10/30/2014   MICROALBUR <0.7 10/30/2014    Dg Lumbar Spine Complete  08/09/2013  CLINICAL DATA:  Low back pain with right-sided radicular symptoms EXAM: LUMBAR SPINE - COMPLETE 4+ VIEW COMPARISON:  None. FINDINGS: Frontal, lateral, spot lumbosacral lateral, and bilateral oblique views were obtained. There are 5 non-rib-bearing lumbar type vertebral bodies. There is lumbar levoscoliosis. There is no fracture or spondylolisthesis. Disc spaces appear intact. There is no appreciable facet arthropathy. IMPRESSION: Scoliosis.  No fracture or appreciable arthropathy. Electronically Signed   By: Bretta BangWilliam  Woodruff M.D.   On: 08/09/2013 13:17     Assessment & Plan:  Plan I am having Ms. Weidemann maintain her sodium chloride, cetirizine, omeprazole, multivitamin-prenatal, valACYclovir, Garlic, omega-3 fish oil, triamcinolone, levocetirizine, Naltrexone-Bupropion HCl ER, metroNIDAZOLE, losartan-hydrochlorothiazide, and  metoprolol succinate.  Meds ordered this encounter  Medications  . losartan-hydrochlorothiazide (HYZAAR) 100-25 MG tablet    Sig: Take 1 tablet by mouth daily.    Dispense:  90 tablet    Refill:  3  . metoprolol succinate (TOPROL-XL) 100 MG 24 hr tablet    Sig: Take 1 tablet (100 mg total) by mouth daily. Take with or immediately following a meal.    Dispense:  90 tablet    Refill:  3    Problem List Items Addressed This Visit    HTN (hypertension) - Primary    Running high today Pt will start checking her bp at home and send us her readings con't metoprolol and losartan hct      Relevant Medications   losartan-hydrochlorothiazide (HYZAAR) 100-25 MG tablet   metoprolol succinate (TOPROL-XL) 100 MG 24 hr tablet      Follow-up: Return in about 3 months (around 07/19/2015), or if symptoms worsen or fail to improve,  for hypertension.  Garnet Koyanagi, DO

## 2015-04-20 NOTE — Patient Instructions (Signed)
Hypertension Hypertension, commonly called high blood pressure, is when the force of blood pumping through your arteries is too strong. Your arteries are the blood vessels that carry blood from your heart throughout your body. A blood pressure reading consists of a higher number over a lower number, such as 110/72. The higher number (systolic) is the pressure inside your arteries when your heart pumps. The lower number (diastolic) is the pressure inside your arteries when your heart relaxes. Ideally you want your blood pressure below 120/80. Hypertension forces your heart to work harder to pump blood. Your arteries may become narrow or stiff. Having untreated or uncontrolled hypertension can cause heart attack, stroke, kidney disease, and other problems. RISK FACTORS Some risk factors for high blood pressure are controllable. Others are not.  Risk factors you cannot control include:   Race. You may be at higher risk if you are African American.  Age. Risk increases with age.  Gender. Men are at higher risk than women before age 45 years. After age 65, women are at higher risk than men. Risk factors you can control include:  Not getting enough exercise or physical activity.  Being overweight.  Getting too much fat, sugar, calories, or salt in your diet.  Drinking too much alcohol. SIGNS AND SYMPTOMS Hypertension does not usually cause signs or symptoms. Extremely high blood pressure (hypertensive crisis) may cause headache, anxiety, shortness of breath, and nosebleed. DIAGNOSIS To check if you have hypertension, your health care provider will measure your blood pressure while you are seated, with your arm held at the level of your heart. It should be measured at least twice using the same arm. Certain conditions can cause a difference in blood pressure between your right and left arms. A blood pressure reading that is higher than normal on one occasion does not mean that you need treatment. If  it is not clear whether you have high blood pressure, you may be asked to return on a different day to have your blood pressure checked again. Or, you may be asked to monitor your blood pressure at home for 1 or more weeks. TREATMENT Treating high blood pressure includes making lifestyle changes and possibly taking medicine. Living a healthy lifestyle can help lower high blood pressure. You may need to change some of your habits. Lifestyle changes may include:  Following the DASH diet. This diet is high in fruits, vegetables, and whole grains. It is low in salt, red meat, and added sugars.  Keep your sodium intake below 2,300 mg per day.  Getting at least 30-45 minutes of aerobic exercise at least 4 times per week.  Losing weight if necessary.  Not smoking.  Limiting alcoholic beverages.  Learning ways to reduce stress. Your health care provider may prescribe medicine if lifestyle changes are not enough to get your blood pressure under control, and if one of the following is true:  You are 18-59 years of age and your systolic blood pressure is above 140.  You are 60 years of age or older, and your systolic blood pressure is above 150.  Your diastolic blood pressure is above 90.  You have diabetes, and your systolic blood pressure is over 140 or your diastolic blood pressure is over 90.  You have kidney disease and your blood pressure is above 140/90.  You have heart disease and your blood pressure is above 140/90. Your personal target blood pressure may vary depending on your medical conditions, your age, and other factors. HOME CARE INSTRUCTIONS    Have your blood pressure rechecked as directed by your health care provider.   Take medicines only as directed by your health care provider. Follow the directions carefully. Blood pressure medicines must be taken as prescribed. The medicine does not work as well when you skip doses. Skipping doses also puts you at risk for  problems.  Do not smoke.   Monitor your blood pressure at home as directed by your health care provider. SEEK MEDICAL CARE IF:   You think you are having a reaction to medicines taken.  You have recurrent headaches or feel dizzy.  You have swelling in your ankles.  You have trouble with your vision. SEEK IMMEDIATE MEDICAL CARE IF:  You develop a severe headache or confusion.  You have unusual weakness, numbness, or feel faint.  You have severe chest or abdominal pain.  You vomit repeatedly.  You have trouble breathing. MAKE SURE YOU:   Understand these instructions.  Will watch your condition.  Will get help right away if you are not doing well or get worse.   This information is not intended to replace advice given to you by your health care provider. Make sure you discuss any questions you have with your health care provider.   Document Released: 04/18/2005 Document Revised: 09/02/2014 Document Reviewed: 02/08/2013 Elsevier Interactive Patient Education 2016 Elsevier Inc.  

## 2015-04-20 NOTE — Assessment & Plan Note (Signed)
Running high today Pt will start checking her bp at home and send us her readings con't metoprolol and losartan hct

## 2015-05-05 ENCOUNTER — Encounter: Payer: 59 | Admitting: Family Medicine

## 2015-05-27 ENCOUNTER — Ambulatory Visit (INDEPENDENT_AMBULATORY_CARE_PROVIDER_SITE_OTHER): Payer: 59 | Admitting: Cardiovascular Disease

## 2015-05-27 ENCOUNTER — Encounter: Payer: Self-pay | Admitting: Cardiovascular Disease

## 2015-05-27 VITALS — BP 138/84 | HR 84 | Ht 62.0 in | Wt 252.0 lb

## 2015-05-27 DIAGNOSIS — I493 Ventricular premature depolarization: Secondary | ICD-10-CM

## 2015-05-27 DIAGNOSIS — I10 Essential (primary) hypertension: Secondary | ICD-10-CM | POA: Diagnosis not present

## 2015-05-27 NOTE — Patient Instructions (Signed)
No changes in current medications   Your physician wants you to follow-up in 6 months with Dr Duke Salvia.  You will receive a reminder letter in the mail two months in advance. If you don't receive a letter, please call our office to schedule the follow-up appointment.   If you need a refill on your cardiac medications before your next appointment, please call your pharmacy.

## 2015-05-27 NOTE — Progress Notes (Signed)
Cardiology Office Note   Date:  05/27/2015   ID:  Erica Ramos, DOB 03-05-1976, MRN [id number]  PCP:  Loreen Freud, DO  Cardiologist:   Madilyn Hook, MD   Chief Complaint  Patient presents with  . 3 months    no complaints.      Patient ID: Erica Ramos is a 40 y.o. female with hypertension, anxiety and morbid obesity who presents for an evaluation of an abnormal EKG.    Interval History 05/27/15: Erica Ramos had a 48 hour Holter montior that showed 10% PVCs and occasional PACs. Metoprolol was increased to 100 mg.  Her heart has been better than when she first came in October.  She can't remember the last episode of palpitaitons.  She notes them sometimes when her anxiety is worse.  It is also more common when she is laying in bed at night.  This is no longer occurring every night.  She denies lower extremity edema, orthopnea or PND.  She also denies chest pain unless she is feeling anxious.  Since her last appointment her BP has been elevated at times.  She followed up with her PCP, Dr. Laury Axon, who switched her to losartan/HCTZ instead of lisinopril.  Erica Ramos has been working with a Psychologist, educational and exercising regularly.  She has been doing cardio 5-6 times per week.  At first she noted some shortness of breath but this has improved.  She exercises for 1.5 hours each time.  She has also been working on her diet.  She eats 5 small meals daily and avoids sugar.  She is not eating any bread and has lost 12 lb since her last appointment.   History of Present Illness 02/20/15: She presented for a primary care appointement on 10/7 and was noted to have ectopy on exam.  EKG showed sinus rhythm at 95 bpm and right atrial enlargement.  She has been working with her primary care provider to get control of her blood pressure.  She was previously treated with lisinopril-HCTZ which she asked to switch due to concern about potential angioedema.  She was started on amlodipine which caused LE  edema.  She was then started on metoprolol and HCTZ. Since starting this medication her blood pressure has not been as well controlled.  Erica Ramos previously exercise by walking daily but has not been doing so recently.  She moved in September, but before then she was walking in the morning for 2-3K steps and then on Tuesdays and Thursdays did Zumba.  She is ready to start back exercising and has made contact with her walking partner. She notes that she is much more stressed without her morning meditation, prayer and walking.  She struggles with her diet and reports that she eats a lot of fried foods. She's trying to cut back on this as well.   Erica Ramos notes that she has a lot of anxiety. She was recommended to start on medication but does not want any medicines for this indication. In the past she declined counseling as well but thinks that she would be interested at this time.  She gets extremely stressed when seeing doctors and reports that other situations make her very anxious as well. She is not grossly improvement in her symptoms to starting metoprolol. She used to have panic attacks.  She denies lower extremity edema but does have some orthopnea. She sleeps on one pillow but has found herself propping her head up recently. She has no  chest pain. She does note palpitations that occur approximately once per day. Usually this happens first thing in the morning or with stressful situations. She does sometimes feel lightheaded or dizzy when she first gets out of bed in the morning but otherwise doesn't have the symptoms throughout the day. The palpitations and lightheadedness have been new in the last recent month. She denies PND. She has no family history of heart disease.    Past Medical History  Diagnosis Date  . Morbid obesity (HCC)     BMI 47.9  . Bronchitis     chronic - has flare every December  . Hypertension   . GERD (gastroesophageal reflux disease)     Per MD chart note 12/01/2010  .  Persistent headaches     worst during menstrual cycle  . Genital warts   . Environmental allergies   . Anxiety   . Cervical polyp     hx of  . Hx of abnormal Pap smear 2012  . STD (sexually transmitted disease)     hx genital warts/?HSV  . PVC (premature ventricular contraction) 02/20/2015    Past Surgical History  Procedure Laterality Date  . Wisdom tooth extraction    . Colposcopy  2012    no treatment to cervix--pap smears reverted to normal     Current Outpatient Prescriptions  Medication Sig Dispense Refill  . cetirizine (ZYRTEC) 10 MG tablet Take 1 tablet (10 mg total) by mouth daily. 90 tablet 3  . Garlic 10 MG CAPS Take by mouth daily.    Marland Kitchen losartan-hydrochlorothiazide (HYZAAR) 100-25 MG tablet Take 1 tablet by mouth daily. 90 tablet 3  . metoprolol succinate (TOPROL-XL) 100 MG 24 hr tablet Take 1 tablet (100 mg total) by mouth daily. Take with or immediately following a meal. 90 tablet 3  . metroNIDAZOLE (FLAGYL) 500 MG tablet Take 1 tablet (500 mg total) by mouth 2 (two) times daily. 14 tablet 0  . omega-3 fish oil (MAXEPA) 1000 MG CAPS capsule Take by mouth daily.    Marland Kitchen omeprazole (PRILOSEC) 20 MG capsule Take 1 capsule (20 mg total) by mouth daily. 30 capsule 5  . Prenatal Vit-Fe Fumarate-FA (MULTIVITAMIN-PRENATAL) 27-0.8 MG TABS tablet Take 1 tablet by mouth daily at 12 noon.    . triamcinolone (NASACORT AQ) 55 MCG/ACT AERO nasal inhaler Place 2 sprays into the nose daily. 1 Inhaler 12  . valACYclovir (VALTREX) 500 MG tablet Take 1 tablet (500 mg) once daily. (Patient taking differently: Take by mouth daily as needed. Take 1 tablet (500 mg) once daily.) 30 tablet 12   No current facility-administered medications for this visit.    Allergies:   Neosporin and Penicillins    Social History:  The patient  reports that she has never smoked. She has never used smokeless tobacco. She reports that she drinks about 0.6 oz of alcohol per week. She reports that she does not  use illicit drugs.   Family History:  The patient's family history includes Alcohol abuse in her father; Asthma in her sister; Diabetes in her maternal aunt, maternal grandmother, maternal uncle, and mother; Hypertension in her maternal grandmother, mother, other, sister, and sister; Obesity in her other; Seizures in her maternal aunt. There is no history of COPD.    ROS:  Please see the history of present illness.   Otherwise, review of systems are positive for none.   All other systems are reviewed and negative.    PHYSICAL EXAM: VS:  BP 138/84 mmHg  Pulse 84  Ht 5\' 2"  (1.575 m)  Wt 114.306 kg (252 lb)  BMI 46.08 kg/m2 , BMI Body mass index is 46.08 kg/(m^2). GENERAL:  Well appearing.  Initially tearful but became brighter throughout the interview. HEENT:  Pupils equal round and reactive, fundi not visualized, oral mucosa unremarkable NECK:  No jugular venous distention, waveform within normal limits, carotid upstroke brisk and symmetric, no bruits, no thyromegaly LYMPHATICS:  No cervical adenopathy LUNGS:  Clear to auscultation bilaterally HEART:  Irregularly irregular.  PMI not displaced or sustained,S1 and S2 within normal limits, no S3, no S4, no clicks, no rubs, no murmurs ABD:  Flat, positive bowel sounds normal in frequency in pitch, no bruits, no rebound, no guarding, no midline pulsatile mass, no hepatomegaly, no splenomegaly EXT:  2 plus pulses throughout, no edema, no cyanosis no clubbing SKIN:  No rashes no nodules NEURO:  Cranial nerves II through XII grossly intact, motor grossly intact throughout PSYCH:  Cognitively intact, oriented to person place and time   EKG:  EKG is not ordered today.  48 Hour Holter Monitor 03/03/15:  Quality: Fair. Baseline artifact. Predominant rhythm: sinus rhythm Average heart rate: 77 bpm Max heart rate: 110 bpm Min heart rate: 46 bpm Pauses >2.5 seconds: 0 Ventricular ectopics: 18,952 (1084 isolated, 533 bigemeny, 178 couplets, one  triplet)  Percentage of ventricular ectopic beats: 10.3% Morphology: polymorphic with 2 different morphologies Supraventricular ectopics: 39  Recent Labs: 10/08/2014: Hemoglobin 12.6; Platelets 384 10/30/2014: ALT 15 02/06/2015: BUN 9; Creat 0.68; Potassium 3.9; Sodium 141; TSH 2.342    Lipid Panel    Component Value Date/Time   CHOL 144 10/30/2014 1050   TRIG 52.0 10/30/2014 1050   HDL 44.50 10/30/2014 1050   CHOLHDL 3 10/30/2014 1050   VLDL 10.4 10/30/2014 1050   LDLCALC 89 10/30/2014 1050      Wt Readings from Last 3 Encounters:  05/27/15 114.306 kg (252 lb)  04/20/15 119.931 kg (264 lb 6.4 oz)  03/19/15 119.477 kg (263 lb 6.4 oz)      ASSESSMENT AND PLAN:  # Palpitations/PVCs: Erica Ramos had 10% PVCs on Holter monitor.  She continues to have some ectopy on exam today.  However, her symptoms are significantly improved on metoprolol.  She no longer has frequent palpitations.  She has no heart failure on exam or by on exam.  For now we will continue metoprolol.  At her next appointment we will repeat a Holter monitor to reassess her PVC burden.  # Morbid obesity: BMI 46. Erica Ramos was congratulated on her commitment to diet and exercise.  She has been doing an excellent job of making dietary and lifestyle changes.    # Hypertension: Blood pressure is well-controlled today.  Continue losartan 100mg , HCTZ 25 mg, and metoprolol succinate 100 mg daily.   I spent 30 minute directly with Erica Ramos.  Current medicines are reviewed at length with the patient today.  The patient does not have concerns regarding medicines.  The following changes have been made: None  Labs/ tests ordered today include:   No orders of the defined types were placed in this encounter.     Disposition:   FU with Ashtynn Berke C. Duke Salvia, MD in 3 months.   Signed, Madilyn Hook, MD  05/27/2015 10:32 PM    [facility]

## 2015-07-22 ENCOUNTER — Telehealth: Payer: Self-pay | Admitting: Family Medicine

## 2015-07-22 DIAGNOSIS — T7840XA Allergy, unspecified, initial encounter: Secondary | ICD-10-CM

## 2015-07-22 NOTE — Telephone Encounter (Signed)
Ok to refer to allergist

## 2015-07-22 NOTE — Telephone Encounter (Signed)
Caller name: Self  Can be reached: [phone]   Reason for call: Patient states she has had allergic reactions to certain foods and her allergies are getting worse. States she has a chronic eye allergy and he specialist informed her to have the allergy skin test, she has already had the blood test.

## 2015-07-22 NOTE — Telephone Encounter (Signed)
Please advise if it is ok to send to the allergist.     KP

## 2015-07-23 ENCOUNTER — Ambulatory Visit (INDEPENDENT_AMBULATORY_CARE_PROVIDER_SITE_OTHER): Payer: 59 | Admitting: Family Medicine

## 2015-07-23 ENCOUNTER — Encounter: Payer: Self-pay | Admitting: Family Medicine

## 2015-07-23 VITALS — BP 144/82 | HR 93 | Ht 62.0 in | Wt 255.0 lb

## 2015-07-23 DIAGNOSIS — M25561 Pain in right knee: Secondary | ICD-10-CM | POA: Diagnosis not present

## 2015-07-23 MED ORDER — DICLOFENAC SODIUM 75 MG PO TBEC: 75.0000 mg | DELAYED_RELEASE_TABLET | Freq: Two times a day (BID) | ORAL | Status: DC

## 2015-07-23 NOTE — Patient Instructions (Addendum)
You have a synovitis of your right knee. Take voltaren twice a day with food for pain and inflammation for 7-10 days then as needed. ACE wrap or compression sleeve to help with swelling. Elevate above your heart as much as possible. Icing 15 minutes at a time 3-4 times a day at least. Avoid deep squats, deep lunges, leg press. Follow up with me in 4 weeks but you can come back sooner if you want to try aspiration and injection of the fluid.

## 2015-07-23 NOTE — Telephone Encounter (Signed)
Ref placed.      KP 

## 2015-07-24 DIAGNOSIS — M25561 Pain in right knee: Secondary | ICD-10-CM | POA: Insufficient documentation

## 2015-07-24 NOTE — Progress Notes (Signed)
PCP: Loreen Freud, DO  Subjective:   HPI: Patient is a 40 y.o. female here for right knee pain.  Patient reports she started to get right knee pain and swelling 2 weeks ago. Started noticing this when she was walking. Pain is 0/10 but gets very stiff and sore at 10/10 level. Pain is anterior but radiates to back of her knee. Feels unstable at times. No skin changes, fever, other complaints.  Past Medical History  Diagnosis Date  . Morbid obesity (HCC)     BMI 47.9  . Bronchitis     chronic - has flare every December  . Hypertension   . GERD (gastroesophageal reflux disease)     Per MD chart note 12/01/2010  . Persistent headaches     worst during menstrual cycle  . Genital warts   . Environmental allergies   . Anxiety   . Cervical polyp     hx of  . Hx of abnormal Pap smear 2012  . STD (sexually transmitted disease)     hx genital warts/?HSV  . PVC (premature ventricular contraction) 02/20/2015    Current Outpatient Prescriptions on File Prior to Visit  Medication Sig Dispense Refill  . cetirizine (ZYRTEC) 10 MG tablet Take 1 tablet (10 mg total) by mouth daily. 90 tablet 3  . Garlic 10 MG CAPS Take by mouth daily.    Marland Kitchen losartan-hydrochlorothiazide (HYZAAR) 100-25 MG tablet Take 1 tablet by mouth daily. 90 tablet 3  . metoprolol succinate (TOPROL-XL) 100 MG 24 hr tablet Take 1 tablet (100 mg total) by mouth daily. Take with or immediately following a meal. 90 tablet 3  . metroNIDAZOLE (FLAGYL) 500 MG tablet Take 1 tablet (500 mg total) by mouth 2 (two) times daily. 14 tablet 0  . omega-3 fish oil (MAXEPA) 1000 MG CAPS capsule Take by mouth daily.    Marland Kitchen omeprazole (PRILOSEC) 20 MG capsule Take 1 capsule (20 mg total) by mouth daily. 30 capsule 5  . Prenatal Vit-Fe Fumarate-FA (MULTIVITAMIN-PRENATAL) 27-0.8 MG TABS tablet Take 1 tablet by mouth daily at 12 noon.    . triamcinolone (NASACORT AQ) 55 MCG/ACT AERO nasal inhaler Place 2 sprays into the nose daily. 1 Inhaler 12  .  valACYclovir (VALTREX) 500 MG tablet Take 1 tablet (500 mg) once daily. (Patient taking differently: Take by mouth daily as needed. Take 1 tablet (500 mg) once daily.) 30 tablet 12   No current facility-administered medications on file prior to visit.    Past Surgical History  Procedure Laterality Date  . Wisdom tooth extraction    . Colposcopy  2012    no treatment to cervix--pap smears reverted to normal    Allergies  Allergen Reactions  . Neosporin [Neomycin-Bacitracin Zn-Polymyx] Anaphylaxis  . Penicillins Rash and Other (See Comments)    Fever    Social History   Social History  . Marital Status: Single    Spouse Name: N/A  . Number of Children: 0  . Years of Education: N/A   Occupational History  .  [employer]   Social History Main Topics  . Smoking status: Never Smoker   . Smokeless tobacco: Never Used  . Alcohol Use: 0.6 oz/week    1 Standard drinks or equivalent per week     Comment: Occasionally; less than 1-2 a week  . Drug Use: No  . Sexual Activity:    Partners: Male    Birth Control/ Protection: Condom     Comment: condoms sometimes   Other Topics  Concern  . Not on file   Social History Narrative   Exercise--  2-3 x a week for about 30 min- 60 min    Family History  Problem Relation Age of Onset  . Diabetes Mother   . Hypertension Mother     Under control  . Diabetes Maternal Aunt   . Seizures Maternal Aunt   . Diabetes Maternal Uncle   . Diabetes Maternal Grandmother   . Hypertension Maternal Grandmother   . Hypertension Other   . Obesity Other   . Alcohol abuse Father   . Asthma Sister   . Hypertension Sister   . COPD Neg Hx   . Hypertension Sister     BP 144/82 mmHg  Pulse 93  Ht 5\' 2"  (1.575 m)  Wt 255 lb (115.667 kg)  BMI 46.63 kg/m2  Review of Systems: See HPI above.    Objective:  Physical Exam:  Gen: NAD, comfortable in exam room  Right knee: Mod effusion.  No bruising, other deformity. No TTP. FROM -  pain on full flexion superior knee. Negative ant/post drawers. Negative valgus/varus testing. Negative lachmanns. Negative mcmurrays, apleys, patellar apprehension. NV intact distally.  Left knee: FROM without pain.    Assessment & Plan:  1. Right knee pain, effusion - consistent with a simple synovitis.  No redness and motion is relatively pain free - doubt gout or other inflammatory arthropathy.  Discussed aspiration and injection - she would like to wait on this.  Voltaren with icing, elevation, compression.  Avoid deep squats, lunges, leg press.  F/u in 4 weeks.

## 2015-07-24 NOTE — Assessment & Plan Note (Signed)
consistent with a simple synovitis.  No redness and motion is relatively pain free - doubt gout or other inflammatory arthropathy.  Discussed aspiration and injection - she would like to wait on this.  Voltaren with icing, elevation, compression.  Avoid deep squats, lunges, leg press.  F/u in 4 weeks.

## 2015-11-12 LAB — OB RESULTS CONSOLE RPR: RPR: NONREACTIVE

## 2015-11-12 LAB — OB RESULTS CONSOLE ANTIBODY SCREEN: ANTIBODY SCREEN: NEGATIVE

## 2015-11-12 LAB — OB RESULTS CONSOLE HEPATITIS B SURFACE ANTIGEN: HEP B S AG: NEGATIVE

## 2015-11-12 LAB — OB RESULTS CONSOLE ABO/RH: RH TYPE: POSITIVE

## 2015-11-12 LAB — OB RESULTS CONSOLE HIV ANTIBODY (ROUTINE TESTING): HIV: NONREACTIVE

## 2015-11-12 LAB — OB RESULTS CONSOLE RUBELLA ANTIBODY, IGM: Rubella: IMMUNE

## 2015-11-12 LAB — OB RESULTS CONSOLE GC/CHLAMYDIA
CHLAMYDIA, DNA PROBE: NEGATIVE
Gonorrhea: NEGATIVE

## 2015-11-20 ENCOUNTER — Other Ambulatory Visit: Payer: Self-pay | Admitting: Obstetrics and Gynecology

## 2015-11-20 ENCOUNTER — Other Ambulatory Visit (HOSPITAL_COMMUNITY)
Admission: RE | Admit: 2015-11-20 | Discharge: 2015-11-20 | Disposition: A | Discharge status: Home / Self Care | Payer: 59 | Source: Ambulatory Visit | Attending: Obstetrics and Gynecology | Admitting: Obstetrics and Gynecology

## 2015-11-20 DIAGNOSIS — Z1151 Encounter for screening for human papillomavirus (HPV): Secondary | ICD-10-CM | POA: Insufficient documentation

## 2015-11-20 DIAGNOSIS — Z01419 Encounter for gynecological examination (general) (routine) without abnormal findings: Secondary | ICD-10-CM | POA: Insufficient documentation

## 2015-11-24 LAB — CYTOLOGY - PAP

## 2015-12-25 ENCOUNTER — Ambulatory Visit: Payer: 59 | Admitting: Cardiovascular Disease

## 2015-12-27 NOTE — Progress Notes (Deleted)
Cardiology Office Note   Date:  12/27/2015   ID:  AYLIE ROBBIE, DOB 08/04/1975, MRN [id number]  PCP:  Donato Schultz, DO  Cardiologist:   Chilton Si, MD   No chief complaint on file.     Patient ID: Erica Ramos is a 40 y.o. female with frequent PVCs, hypertension, anxiety and morbid obesity who presents for follow up.  Erica Ramos was first seen 01/2015 after her PCP noted ectopy on exam.  At her first appointment she reported palpitations that occur mostly in stressful situations.  She was referred for a 48 hour Holter 03/2015 that revealed 10% PVCs and occasional PACs.  Her metoprolol was increased to 100 mg, which helped reduce the frequency of his PVCs. At her last appointment in 05/2015 her symptoms were well-controlled and she was working on weight loss through diet and exercise.  Erica Ramos suffers from anxiety but has been unwilling to take medication.   Lisinopril-HCTZ Potential angioedema Amlodipine- LE edema     Past Medical History:  Diagnosis Date  . Anxiety   . Bronchitis    chronic - has flare every December  . Cervical polyp    hx of  . Environmental allergies   . Genital warts   . GERD (gastroesophageal reflux disease)    Per MD chart note 12/01/2010  . Hx of abnormal Pap smear 2012  . Hypertension   . Morbid obesity (HCC)    BMI 47.9  . Persistent headaches    worst during menstrual cycle  . PVC (premature ventricular contraction) 02/20/2015  . STD (sexually transmitted disease)    hx genital warts/?HSV    Past Surgical History:  Procedure Laterality Date  . COLPOSCOPY  2012   no treatment to cervix--pap smears reverted to normal  . WISDOM TOOTH EXTRACTION       Current Outpatient Prescriptions  Medication Sig Dispense Refill  . cetirizine (ZYRTEC) 10 MG tablet Take 1 tablet (10 mg total) by mouth daily. 90 tablet 3  . diclofenac (VOLTAREN) 75 MG EC tablet Take 1 tablet (75 mg total) by mouth 2 (two) times daily. 60 tablet 1    . Garlic 10 MG CAPS Take by mouth daily.    Marland Kitchen losartan-hydrochlorothiazide (HYZAAR) 100-25 MG tablet Take 1 tablet by mouth daily. 90 tablet 3  . metoprolol succinate (TOPROL-XL) 100 MG 24 hr tablet Take 1 tablet (100 mg total) by mouth daily. Take with or immediately following a meal. 90 tablet 3  . metroNIDAZOLE (FLAGYL) 500 MG tablet Take 1 tablet (500 mg total) by mouth 2 (two) times daily. 14 tablet 0  . omega-3 fish oil (MAXEPA) 1000 MG CAPS capsule Take by mouth daily.    Marland Kitchen omeprazole (PRILOSEC) 20 MG capsule Take 1 capsule (20 mg total) by mouth daily. 30 capsule 5  . Prenatal Vit-Fe Fumarate-FA (MULTIVITAMIN-PRENATAL) 27-0.8 MG TABS tablet Take 1 tablet by mouth daily at 12 noon.    . triamcinolone (NASACORT AQ) 55 MCG/ACT AERO nasal inhaler Place 2 sprays into the nose daily. 1 Inhaler 12  . valACYclovir (VALTREX) 500 MG tablet Take 1 tablet (500 mg) once daily. (Patient taking differently: Take by mouth daily as needed. Take 1 tablet (500 mg) once daily.) 30 tablet 12   No current facility-administered medications for this visit.     Allergies:   Neosporin [neomycin-bacitracin zn-polymyx] and Penicillins    Social History:  The patient  reports that she has never smoked. She has never used  smokeless tobacco. She reports that she drinks about 0.6 oz of alcohol per week . She reports that she does not use drugs.   Family History:  The patient's family history includes Alcohol abuse in her father; Asthma in her sister; Diabetes in her maternal aunt, maternal grandmother, maternal uncle, and mother; Hypertension in her maternal grandmother, mother, other, sister, and sister; Obesity in her other; Seizures in her maternal aunt.    ROS:  Please see the history of present illness.   Otherwise, review of systems are positive for none.   All other systems are reviewed and negative.    PHYSICAL EXAM: VS:  There were no vitals taken for this visit. , BMI There is no height or weight on  file to calculate BMI. GENERAL:  Well appearing.  Initially tearful but became brighter throughout the interview. HEENT:  Pupils equal round and reactive, fundi not visualized, oral mucosa unremarkable NECK:  No jugular venous distention, waveform within normal limits, carotid upstroke brisk and symmetric, no bruits, no thyromegaly LYMPHATICS:  No cervical adenopathy LUNGS:  Clear to auscultation bilaterally HEART:  Irregularly irregular.  PMI not displaced or sustained,S1 and S2 within normal limits, no S3, no S4, no clicks, no rubs, no murmurs ABD:  Flat, positive bowel sounds normal in frequency in pitch, no bruits, no rebound, no guarding, no midline pulsatile mass, no hepatomegaly, no splenomegaly EXT:  2 plus pulses throughout, no edema, no cyanosis no clubbing SKIN:  No rashes no nodules NEURO:  Cranial nerves II through XII grossly intact, motor grossly intact throughout PSYCH:  Cognitively intact, oriented to person place and time   EKG:  EKG is not ordered today.  48 Hour Holter Monitor 03/03/15:  Quality: Fair. Baseline artifact. Predominant rhythm: sinus rhythm Average heart rate: 77 bpm Max heart rate: 110 bpm Min heart rate: 46 bpm Pauses >2.5 seconds: 0 Ventricular ectopics: 18,952 (1084 isolated, 533 bigemeny, 178 couplets, one triplet)  Percentage of ventricular ectopic beats: 10.3% Morphology: polymorphic with 2 different morphologies Supraventricular ectopics: 39  Recent Labs: 02/06/2015: BUN 9; Creat 0.68; Potassium 3.9; Sodium 141; TSH 2.342    Lipid Panel    Component Value Date/Time   CHOL 144 10/30/2014 1050   TRIG 52.0 10/30/2014 1050   HDL 44.50 10/30/2014 1050   CHOLHDL 3 10/30/2014 1050   VLDL 10.4 10/30/2014 1050   LDLCALC 89 10/30/2014 1050      Wt Readings from Last 3 Encounters:  07/23/15 255 lb (115.7 kg)  05/27/15 252 lb (114.3 kg)  04/20/15 264 lb 6.4 oz (119.9 kg)      ASSESSMENT AND PLAN:  # Palpitations/PVCs: Erica Ramos had  10% PVCs on Holter monitor.  She continues to have some ectopy on exam today.  However, her symptoms are significantly improved on metoprolol.  She no longer has frequent palpitations.  She has no heart failure on exam or by on exam.  For now we will continue metoprolol.  At her next appointment we will repeat a Holter monitor to reassess her PVC burden.  # Morbid obesity: BMI 46. Erica Ramos was congratulated on her commitment to diet and exercise.  She has been doing an excellent job of making dietary and lifestyle changes.    # Hypertension: Blood pressure is well-controlled today.  Continue losartan 100mg , HCTZ 25 mg, and metoprolol succinate 100 mg daily.   I spent 30 minute directly with Erica Ramos.  Current medicines are reviewed at length with the patient today.  The patient  does not have concerns regarding medicines.  The following changes have been made: None  Labs/ tests ordered today include:   No orders of the defined types were placed in this encounter.    Disposition:   FU with Osmany Azer C. Duke Salvia, MD in 3 months.   Signed, Chilton Si, MD  12/27/2015 12:24 AM    [facility]

## 2015-12-28 ENCOUNTER — Ambulatory Visit: Payer: 59 | Admitting: Cardiovascular Disease

## 2015-12-29 ENCOUNTER — Encounter: Payer: Self-pay | Admitting: *Deleted

## 2016-02-25 NOTE — Progress Notes (Deleted)
40 y.o. G1P0010 Single African American female here for annual exam.    PCP:     No LMP recorded.           Sexually active: {yes no:314532}  The current method of family planning is {contraception:315051}.    Exercising: {yes no:314532}  {types:19826} Smoker:  no  Health Maintenance: Pap:  11-20-15 Neg:Neg HR HPV at [facility] History of abnormal Pap:  Yes,Hx 11-08-12 Ascus:Pos HR HPV;colposcopy revealed HPV changes, no dysplasia. MMG:  n/a Colonoscopy:  n/a BMD:   n/a  Result  n/a TDaP:  11-08-12 Gardasil:   {YES NO:22349} HIV: Hep C: Screening Labs:  Hb today: ***, Urine today: ***   reports that she has never smoked. She has never used smokeless tobacco. She reports that she drinks about 0.6 oz of alcohol per week . She reports that she does not use drugs.  Past Medical History:  Diagnosis Date  . Anxiety   . Bronchitis    chronic - has flare every December  . Cervical polyp    hx of  . Environmental allergies   . Genital warts   . GERD (gastroesophageal reflux disease)    Per MD chart note 12/01/2010  . Hx of abnormal Pap smear 2012  . Hypertension   . Morbid obesity (HCC)    BMI 47.9  . Persistent headaches    worst during menstrual cycle  . PVC (premature ventricular contraction) 02/20/2015  . STD (sexually transmitted disease)    hx genital warts/?HSV    Past Surgical History:  Procedure Laterality Date  . COLPOSCOPY  2012   no treatment to cervix--pap smears reverted to normal  . WISDOM TOOTH EXTRACTION      Current Outpatient Prescriptions  Medication Sig Dispense Refill  . cetirizine (ZYRTEC) 10 MG tablet Take 1 tablet (10 mg total) by mouth daily. 90 tablet 3  . diclofenac (VOLTAREN) 75 MG EC tablet Take 1 tablet (75 mg total) by mouth 2 (two) times daily. 60 tablet 1  . Garlic 10 MG CAPS Take by mouth daily.    Marland Kitchen. losartan-hydrochlorothiazide (HYZAAR) 100-25 MG tablet Take 1 tablet by mouth daily. 90 tablet 3  . metoprolol succinate (TOPROL-XL)  100 MG 24 hr tablet Take 1 tablet (100 mg total) by mouth daily. Take with or immediately following a meal. 90 tablet 3  . metroNIDAZOLE (FLAGYL) 500 MG tablet Take 1 tablet (500 mg total) by mouth 2 (two) times daily. 14 tablet 0  . omega-3 fish oil (MAXEPA) 1000 MG CAPS capsule Take by mouth daily.    Marland Kitchen. omeprazole (PRILOSEC) 20 MG capsule Take 1 capsule (20 mg total) by mouth daily. 30 capsule 5  . Prenatal Vit-Fe Fumarate-FA (MULTIVITAMIN-PRENATAL) 27-0.8 MG TABS tablet Take 1 tablet by mouth daily at 12 noon.    . triamcinolone (NASACORT AQ) 55 MCG/ACT AERO nasal inhaler Place 2 sprays into the nose daily. 1 Inhaler 12  . valACYclovir (VALTREX) 500 MG tablet Take 1 tablet (500 mg) once daily. (Patient taking differently: Take by mouth daily as needed. Take 1 tablet (500 mg) once daily.) 30 tablet 12   No current facility-administered medications for this visit.     Family History  Problem Relation Age of Onset  . Diabetes Mother   . Hypertension Mother     Under control  . Diabetes Maternal Aunt   . Seizures Maternal Aunt   . Diabetes Maternal Grandmother   . Hypertension Maternal Grandmother   . Alcohol abuse Father   .  Asthma Sister   . Hypertension Sister   . Hypertension Sister   . Diabetes Maternal Uncle   . Hypertension Other   . Obesity Other   . COPD Neg Hx     ROS:  Pertinent items are noted in HPI.  Otherwise, a comprehensive ROS was negative.  Exam:   There were no vitals taken for this visit.    General appearance: alert, cooperative and appears stated age Head: Normocephalic, without obvious abnormality, atraumatic Neck: no adenopathy, supple, symmetrical, trachea midline and thyroid normal to inspection and palpation Lungs: clear to auscultation bilaterally Breasts: normal appearance, no masses or tenderness, No nipple retraction or dimpling, No nipple discharge or bleeding, No axillary or supraclavicular adenopathy Heart: regular rate and rhythm Abdomen:  soft, non-tender; no masses, no organomegaly Extremities: extremities normal, atraumatic, no cyanosis or edema Skin: Skin color, texture, turgor normal. No rashes or lesions Lymph nodes: Cervical, supraclavicular, and axillary nodes normal. No abnormal inguinal nodes palpated Neurologic: Grossly normal  Pelvic: External genitalia:  no lesions              Urethra:  normal appearing urethra with no masses, tenderness or lesions              Bartholins and Skenes: normal                 Vagina: normal appearing vagina with normal color and discharge, no lesions              Cervix: no lesions              Pap taken: {yes no:314532} Bimanual Exam:  Uterus:  normal size, contour, position, consistency, mobility, non-tender              Adnexa: no mass, fullness, tenderness              Rectal exam: {yes no:314532}.  Confirms.              Anus:  normal sphincter tone, no lesions  Chaperone was present for exam.  Assessment:   Well woman visit with normal exam.   Plan: Yearly mammogram recommended after age 58.  Recommended self breast exam.  Pap and HR HPV as above. Discussed Calcium, Vitamin D, regular exercise program including cardiovascular and weight bearing exercise.   Follow up annually and prn.   Additional counseling given.  {yes [id number]. _______ minutes face to face time of which over 50% was spent in counseling.    After visit summary provided.

## 2016-02-26 ENCOUNTER — Encounter: Payer: Self-pay | Admitting: Obstetrics and Gynecology

## 2016-02-26 ENCOUNTER — Ambulatory Visit: Payer: 59 | Admitting: Obstetrics and Gynecology

## 2016-04-17 ENCOUNTER — Other Ambulatory Visit: Payer: Self-pay | Admitting: Family Medicine

## 2016-04-17 DIAGNOSIS — I10 Essential (primary) hypertension: Secondary | ICD-10-CM

## 2016-04-27 ENCOUNTER — Other Ambulatory Visit (HOSPITAL_COMMUNITY): Payer: Self-pay | Admitting: Obstetrics and Gynecology

## 2016-04-27 DIAGNOSIS — Z3689 Encounter for other specified antenatal screening: Secondary | ICD-10-CM

## 2016-04-27 DIAGNOSIS — I10 Essential (primary) hypertension: Secondary | ICD-10-CM

## 2016-04-27 DIAGNOSIS — O163 Unspecified maternal hypertension, third trimester: Secondary | ICD-10-CM

## 2016-04-27 DIAGNOSIS — Z3A35 35 weeks gestation of pregnancy: Secondary | ICD-10-CM

## 2016-04-28 ENCOUNTER — Encounter (HOSPITAL_COMMUNITY): Payer: Self-pay | Admitting: *Deleted

## 2016-04-29 ENCOUNTER — Other Ambulatory Visit (HOSPITAL_COMMUNITY): Payer: Self-pay | Admitting: Obstetrics and Gynecology

## 2016-04-29 ENCOUNTER — Ambulatory Visit (HOSPITAL_COMMUNITY)
Admission: RE | Admit: 2016-04-29 | Discharge: 2016-04-29 | Disposition: A | Discharge status: Home / Self Care | Payer: 59 | Source: Ambulatory Visit | Attending: Obstetrics and Gynecology | Admitting: Obstetrics and Gynecology

## 2016-04-29 DIAGNOSIS — O10919 Unspecified pre-existing hypertension complicating pregnancy, unspecified trimester: Secondary | ICD-10-CM

## 2016-04-29 DIAGNOSIS — O99213 Obesity complicating pregnancy, third trimester: Secondary | ICD-10-CM | POA: Insufficient documentation

## 2016-04-29 DIAGNOSIS — O09523 Supervision of elderly multigravida, third trimester: Secondary | ICD-10-CM

## 2016-04-29 DIAGNOSIS — Z363 Encounter for antenatal screening for malformations: Secondary | ICD-10-CM | POA: Insufficient documentation

## 2016-04-29 DIAGNOSIS — O169 Unspecified maternal hypertension, unspecified trimester: Secondary | ICD-10-CM

## 2016-04-29 DIAGNOSIS — Z3A35 35 weeks gestation of pregnancy: Secondary | ICD-10-CM

## 2016-04-29 DIAGNOSIS — Z3689 Encounter for other specified antenatal screening: Secondary | ICD-10-CM

## 2016-04-29 DIAGNOSIS — Z3A34 34 weeks gestation of pregnancy: Secondary | ICD-10-CM | POA: Insufficient documentation

## 2016-04-29 DIAGNOSIS — O10013 Pre-existing essential hypertension complicating pregnancy, third trimester: Secondary | ICD-10-CM | POA: Diagnosis not present

## 2016-04-29 DIAGNOSIS — I10 Essential (primary) hypertension: Secondary | ICD-10-CM

## 2016-05-03 ENCOUNTER — Ambulatory Visit (HOSPITAL_COMMUNITY): Admission: RE | Admit: 2016-05-03 | Payer: 59 | Source: Ambulatory Visit

## 2016-05-13 ENCOUNTER — Other Ambulatory Visit: Payer: Self-pay | Admitting: Family Medicine

## 2016-05-13 DIAGNOSIS — I10 Essential (primary) hypertension: Secondary | ICD-10-CM

## 2016-05-16 NOTE — Telephone Encounter (Signed)
Patient hasn't been seen in the Office since 04/20/2015.  Please contact Patient to schedule an appointment.

## 2016-05-18 ENCOUNTER — Other Ambulatory Visit: Payer: Self-pay | Admitting: Family Medicine

## 2016-05-18 DIAGNOSIS — I10 Essential (primary) hypertension: Secondary | ICD-10-CM

## 2016-05-20 NOTE — Telephone Encounter (Signed)
Please schedule Patient for a follow up appointment  With Dr. Zola ButtonLowne Chase. Patient hasn't been seen in the Office since 04/20/2015. A 30-day supple of Metoprolol has been sent in to her Pharmacy. However, no additional refills will be given until seen by the Provider.

## 2016-05-24 LAB — OB RESULTS CONSOLE GBS: STREP GROUP B AG: POSITIVE

## 2016-05-25 ENCOUNTER — Telehealth (HOSPITAL_COMMUNITY): Payer: Self-pay | Admitting: *Deleted

## 2016-05-25 ENCOUNTER — Encounter (HOSPITAL_COMMUNITY): Payer: Self-pay | Admitting: *Deleted

## 2016-05-25 NOTE — Telephone Encounter (Signed)
Preadmission screen  

## 2016-05-25 NOTE — Telephone Encounter (Signed)
Attempted to follow up with patient to see if she wanted to schedule appointment. Telephone was out of service.

## 2016-06-01 ENCOUNTER — Other Ambulatory Visit: Payer: Self-pay | Admitting: Obstetrics and Gynecology

## 2016-06-01 NOTE — H&P (Signed)
Erica Ramos is a 41 y.o. female G2P0010 at 39 wks and 0 days based on LMP  And confirmed by 11 wk u/s with EDD 06/10/2016.   presenting for Induction of labor due to chronic hypetension. Her pregnancy has also been complicated by advanced maternal age,and  HSV 2 Infection . No current outbreak. She is on valtrex for suppression.  She had an ultrasound 05/17/2016 and fetus was 7 lbs 11 oz >90%ile .    OB History    Gravida Para Term Preterm AB Living   2       1     SAB TAB Ectopic Multiple Live Births   1             Past Medical History:  Diagnosis Date  . Anxiety   . Bronchitis    chronic - has flare every December  . Cervical polyp    hx of  . Environmental allergies   . Genital warts   . GERD (gastroesophageal reflux disease)    Per MD chart note 12/01/2010  . Hx of abnormal Pap smear 2012  . Hypertension   . Morbid obesity (HCC)    BMI 47.9  . Persistent headaches    worst during menstrual cycle  . PVC (premature ventricular contraction) 02/20/2015  . STD (sexually transmitted disease)    hx genital warts/?HSV  . Vaginal Pap smear, abnormal    Past Surgical History:  Procedure Laterality Date  . COLPOSCOPY  2012   no treatment to cervix--pap smears reverted to normal  . WISDOM TOOTH EXTRACTION     Family History: family history includes Alcohol abuse in her father; Diabetes in her maternal aunt, maternal grandmother, maternal uncle, and mother; Hypertension in her maternal grandmother, mother, other, and sister; Obesity in her other; Seizures in her maternal aunt. Social History:  reports that she has never smoked. She has never used smokeless tobacco. She reports that she drinks about 0.6 oz of alcohol per week . She reports that she does not use drugs.     Maternal Diabetes: No Genetic Screening: Declined Maternal Ultrasounds/Referrals: Normal Fetal Ultrasounds or other Referrals:  None Maternal Substance Abuse:  No Significant Maternal Medications:  Meds  include: Other:  Significant Maternal Lab Results:  Lab values include: Group B Strep positive Other Comments:  None  Review of Systems  Constitutional: Negative.   HENT: Negative.   Eyes: Negative.   Respiratory: Negative.   Cardiovascular: Negative.   Gastrointestinal: Negative.   Genitourinary: Negative.   Musculoskeletal: Negative.   Skin: Negative.   Neurological: Negative.   Endo/Heme/Allergies: Negative.   Psychiatric/Behavioral: The patient is nervous/anxious and has insomnia.    Maternal Medical History:  Fetal activity: Perceived fetal activity is normal.    Prenatal complications: PIH.   Prenatal Complications - Diabetes: none.      Last menstrual period 09/04/2015. Maternal Exam:  Abdomen: Patient reports no abdominal tenderness. Fundal height is 40.   Estimated fetal weight is 7 lbs 11 oz on ultrasound 05/17/2016.. by Leopolds 8 1/2 - 9 lbs .   Fetal presentation: vertex  Introitus: Normal vulva. Normal vagina.    Physical Exam  Vitals reviewed. Constitutional: She is oriented to person, place, and time. She appears well-developed and well-nourished.  HENT:  Head: Normocephalic and atraumatic.  Eyes: Conjunctivae are normal. Pupils are equal, round, and reactive to light.  Neck: Normal range of motion. Neck supple.  Cardiovascular: Normal rate and regular rhythm.   Respiratory: Effort normal and  breath sounds normal.  GI: Bowel sounds are normal. There is no tenderness.  Genitourinary: Vagina normal.  Musculoskeletal: Normal range of motion. She exhibits no edema.  Neurological: She is alert and oriented to person, place, and time. She has normal reflexes.  Skin: Skin is warm and dry.  Psychiatric: She has a normal mood and affect.    Prenatal labs: ABO, Rh: A/Positive/-- (07/13 0000) Antibody: Negative (07/13 0000) Rubella: Immune (07/13 0000) RPR: Nonreactive (07/13 0000)  HBsAg: Negative (07/13 0000)  HIV: Non-reactive (07/13 0000)  GBS:    positive   Assessment/Plan: 39 wks and 0 days with chronic hypertension continue metoprolol ER 100 mg daily  and procardia XL 30 mg daily  Admit for induction due to hypertension Cytotec for cervical ripening.  HSV2 - no lesions on exam 05/31/2016  Vancomycin for GBS prophylaxis as pt is penicillin allergic and cultures were resistant to clindamycin.  CCOB covering Midnight 06/03/2016-7am 06/03/2016 Dr. Richardson Doppole  will assume care 06/03/2016 at 7 am     Mitsuye Schrodt J. 06/01/2016, 5:40 PM

## 2016-06-03 ENCOUNTER — Inpatient Hospital Stay (HOSPITAL_COMMUNITY)
Admission: RE | Admit: 2016-06-03 | Discharge: 2016-06-07 | DRG: 765 | Disposition: A | Discharge status: Home / Self Care | Payer: 59 | Source: Ambulatory Visit | Attending: Obstetrics & Gynecology | Admitting: Obstetrics & Gynecology

## 2016-06-03 ENCOUNTER — Encounter (HOSPITAL_COMMUNITY): Payer: Self-pay

## 2016-06-03 DIAGNOSIS — O1002 Pre-existing essential hypertension complicating childbirth: Secondary | ICD-10-CM | POA: Diagnosis present

## 2016-06-03 DIAGNOSIS — O163 Unspecified maternal hypertension, third trimester: Secondary | ICD-10-CM | POA: Diagnosis present

## 2016-06-03 DIAGNOSIS — Z3A39 39 weeks gestation of pregnancy: Secondary | ICD-10-CM | POA: Diagnosis not present

## 2016-06-03 DIAGNOSIS — K5900 Constipation, unspecified: Secondary | ICD-10-CM | POA: Diagnosis not present

## 2016-06-03 DIAGNOSIS — O99214 Obesity complicating childbirth: Secondary | ICD-10-CM | POA: Diagnosis present

## 2016-06-03 DIAGNOSIS — F419 Anxiety disorder, unspecified: Secondary | ICD-10-CM | POA: Diagnosis present

## 2016-06-03 DIAGNOSIS — Z880 Allergy status to penicillin: Secondary | ICD-10-CM | POA: Diagnosis not present

## 2016-06-03 DIAGNOSIS — O99824 Streptococcus B carrier state complicating childbirth: Secondary | ICD-10-CM | POA: Diagnosis present

## 2016-06-03 DIAGNOSIS — O9962 Diseases of the digestive system complicating childbirth: Secondary | ICD-10-CM | POA: Diagnosis present

## 2016-06-03 DIAGNOSIS — K219 Gastro-esophageal reflux disease without esophagitis: Secondary | ICD-10-CM | POA: Diagnosis present

## 2016-06-03 DIAGNOSIS — O99344 Other mental disorders complicating childbirth: Secondary | ICD-10-CM | POA: Diagnosis present

## 2016-06-03 DIAGNOSIS — Z6841 Body Mass Index (BMI) 40.0 and over, adult: Secondary | ICD-10-CM | POA: Diagnosis not present

## 2016-06-03 LAB — URINALYSIS, DIPSTICK ONLY
Bilirubin Urine: NEGATIVE
Glucose, UA: NEGATIVE mg/dL
HGB URINE DIPSTICK: NEGATIVE
KETONES UR: NEGATIVE mg/dL
Leukocytes, UA: NEGATIVE
Nitrite: NEGATIVE
PROTEIN: NEGATIVE mg/dL
Specific Gravity, Urine: 1.009 (ref 1.005–1.030)
pH: 6 (ref 5.0–8.0)

## 2016-06-03 LAB — TYPE AND SCREEN
ABO/RH(D): A POS
ANTIBODY SCREEN: NEGATIVE

## 2016-06-03 LAB — CBC
HEMATOCRIT: 36.1 % (ref 36.0–46.0)
HEMOGLOBIN: 13 g/dL (ref 12.0–15.0)
MCH: 27.6 pg (ref 26.0–34.0)
MCHC: 36 g/dL (ref 30.0–36.0)
MCV: 76.6 fL — ABNORMAL LOW (ref 78.0–100.0)
Platelets: 249 10*3/uL (ref 150–400)
RBC: 4.71 MIL/uL (ref 3.87–5.11)
RDW: 14.8 % (ref 11.5–15.5)
WBC: 12.7 10*3/uL — ABNORMAL HIGH (ref 4.0–10.5)

## 2016-06-03 LAB — RPR: RPR Ser Ql: NONREACTIVE

## 2016-06-03 MED ORDER — SOD CITRATE-CITRIC ACID 500-334 MG/5ML PO SOLN
30.0000 mL | ORAL | Status: DC | PRN
  Administered 2016-06-05: 30 mL via ORAL
  Filled 2016-06-03: qty 15

## 2016-06-03 MED ORDER — OXYTOCIN BOLUS FROM INFUSION: 500.0000 mL | Freq: Once | INTRAVENOUS | Status: DC

## 2016-06-03 MED ORDER — TERBUTALINE SULFATE 1 MG/ML IJ SOLN: 0.2500 mg | Freq: Once | INTRAMUSCULAR | Status: DC | PRN

## 2016-06-03 MED ORDER — METOPROLOL SUCCINATE ER 100 MG PO TB24
100.0000 mg | ORAL_TABLET | Freq: Every day | ORAL | Status: DC
  Administered 2016-06-03 – 2016-06-05 (×3): 100 mg via ORAL
  Filled 2016-06-03 (×5): qty 1

## 2016-06-03 MED ORDER — OXYCODONE-ACETAMINOPHEN 5-325 MG PO TABS: 2.0000 | ORAL_TABLET | ORAL | Status: DC | PRN

## 2016-06-03 MED ORDER — ONDANSETRON HCL 4 MG/2ML IJ SOLN: 4.0000 mg | Freq: Four times a day (QID) | INTRAMUSCULAR | Status: DC | PRN

## 2016-06-03 MED ORDER — OXYTOCIN 40 UNITS IN LACTATED RINGERS INFUSION - SIMPLE MED: 2.5000 [IU]/h | INTRAVENOUS | Status: DC

## 2016-06-03 MED ORDER — LACTATED RINGERS IV SOLN
INTRAVENOUS | Status: DC
  Administered 2016-06-03 – 2016-06-04 (×4): via INTRAVENOUS
  Administered 2016-06-04: 125 mL via INTRAVENOUS
  Administered 2016-06-04: 08:00:00 via INTRAVENOUS

## 2016-06-03 MED ORDER — FENTANYL CITRATE (PF) 100 MCG/2ML IJ SOLN
50.0000 ug | INTRAMUSCULAR | Status: DC | PRN
  Administered 2016-06-03: 50 ug via INTRAVENOUS
  Administered 2016-06-03 – 2016-06-04 (×2): 100 ug via INTRAVENOUS
  Filled 2016-06-03 (×3): qty 2

## 2016-06-03 MED ORDER — MISOPROSTOL 25 MCG QUARTER TABLET
25.0000 ug | ORAL_TABLET | ORAL | Status: DC | PRN
  Administered 2016-06-03 (×3): 25 ug via VAGINAL
  Filled 2016-06-03 (×5): qty 0.25

## 2016-06-03 MED ORDER — LACTATED RINGERS IV SOLN
500.0000 mL | INTRAVENOUS | Status: DC | PRN
  Administered 2016-06-04: 500 mL via INTRAVENOUS

## 2016-06-03 MED ORDER — NIFEDIPINE ER OSMOTIC RELEASE 30 MG PO TB24: 30.0000 mg | ORAL_TABLET | Freq: Every day | ORAL | Status: DC

## 2016-06-03 MED ORDER — OXYCODONE-ACETAMINOPHEN 5-325 MG PO TABS: 1.0000 | ORAL_TABLET | ORAL | Status: DC | PRN

## 2016-06-03 MED ORDER — VANCOMYCIN HCL IN DEXTROSE 1-5 GM/200ML-% IV SOLN
1000.0000 mg | Freq: Two times a day (BID) | INTRAVENOUS | Status: DC
  Administered 2016-06-03 – 2016-06-05 (×5): 1000 mg via INTRAVENOUS
  Filled 2016-06-03 (×6): qty 200

## 2016-06-03 MED ORDER — HYDROXYZINE HCL 50 MG PO TABS
50.0000 mg | ORAL_TABLET | Freq: Four times a day (QID) | ORAL | Status: DC | PRN
  Administered 2016-06-03 – 2016-06-05 (×3): 50 mg via ORAL
  Filled 2016-06-03 (×3): qty 1

## 2016-06-03 MED ORDER — LIDOCAINE HCL (PF) 1 % IJ SOLN: 30.0000 mL | INTRAMUSCULAR | Status: DC | PRN

## 2016-06-03 MED ORDER — ACETAMINOPHEN 325 MG PO TABS: 650.0000 mg | ORAL_TABLET | ORAL | Status: DC | PRN

## 2016-06-03 NOTE — Progress Notes (Addendum)
Erica EhrichChandra D Holzer MRN: [id number]  Subjective: -Care assumed of 41 y.o. G2P0010 at 2169w0d who presents for IOL s/t CHTN. In room to meet acquaintance of patient and family.  Patient reports contractions and discomfort, but declines pain medication.  Patient has questions regarding foley bulb process.     Objective: BP 140/74   Pulse 82   Temp 98.1 F (36.7 C) (Oral)   Resp 18   Ht 5\' 2"  (1.575 m)   Wt 123.8 kg (273 lb)   LMP 09/04/2015   BMI 49.93 kg/m  No intake/output data recorded. No intake/output data recorded.  Fetal Monitoring: FHT: 135 bpm, Mod Var, -Decels, +Accels UC: Q1-503min    Vaginal Exam: SVE:   Dilation: 1 Effacement (%): 80 Station: -2 Exam by:: [employer] Corporationjessica Paulette [place] rn Membranes:Intact Internal Monitors: None  Augmentation/Induction: Pitocin:None Cytotec: S/P One Dose at 1330  Assessment:  IUP at 39wks Cat I FT  CHTN Anxiety GBS Positive PCN Allergy  Plan: -Discussed foley bulb insertion including r/b, placement, associated discomfort, initiation of pitocin, and removal. -Patient verbalizes understanding and wishes to process information -Patient offered and accepts vistaril for anxiety -Will await for patient to decide on foley placement -Next Dose vancomycin due at 1330 -Continue other mgmt as ordered  Erica CavaJessica L Ewelina Naves,MSN, CNM 06/03/2016, 8:15 PM   Addendum (1835) Foley bulb placed without difficulty Infused with 80mL of fluid Bishop Score: 6-7 If not expelled in 6 hrs will start pit at 0230  Cherre RobinsJessica L [name] MSN, CNM 06/03/2016 8:53 PM

## 2016-06-03 NOTE — Progress Notes (Signed)
Erica EhrichChandra D Ramos is a 41 y.o. G2P0010 at 7122w0d  admitted for induction of labor due to Hypertension.  Subjective: Patient rates contractions as 6 out of 10  Objective: BP 137/88   Pulse 79   Temp 98 F (36.7 C) (Oral)   Resp 18   Ht 5\' 2"  (1.575 m)   Wt 123.8 kg (273 lb)   LMP 09/04/2015   BMI 49.93 kg/m  No intake/output data recorded. No intake/output data recorded.  FHT:  FHR: 140 bpm, variability: moderate,  accelerations:  Present,  decelerations:  Absent UC:   regular, every 1-3 minutes SVE:   Dilation: 1 Effacement (%): Thick Station: -3 Exam by:: dr Richardson Doppcole  Labs: Lab Results  Component Value Date   WBC 12.7 (H) 06/03/2016   HGB 13.0 06/03/2016   HCT 36.1 06/03/2016   MCV 76.6 (L) 06/03/2016   PLT 249 06/03/2016    Assessment / Plan: 39 wks and 0 days with chronic hypertension s/p cytotec x 3    Labor: pt contracting to often for repeat cytotec. D/W pt foley bulb placement. She will give this consideration.  Fetal Wellbeing:  Category I Pain Control:  Labor support without medications I/D:  vancomycin  Anticipated MOD:  NSVD  CCOB covering after 7pm   Erica Ramos J. 06/03/2016, 3:14 PM

## 2016-06-03 NOTE — Anesthesia Pain Management Evaluation Note (Signed)
  CRNA Pain Management Visit Note  Patient: Erica EhrichChandra D Ramos, 41 y.o., female  "Hello [facility] am a [facility]. We have an anesthesia team available at all times to provide care throughout the hospital, including epidural management and anesthesia for C-section. I don't know your plan for the delivery whether it a natural birth, water birth, IV sedation, nitrous supplementation, doula or epidural, but we want to meet your pain goals."   1.Was your pain managed to your expectations on prior hospitalizations?   No prior hospitalizations  2.What is your expectation for pain management during this hospitalization?     Epidural  3.How can we help you reach that goal? unsure  Record the patient's initial score and the patient's pain goal.   Pain: 0  Pain Goal: 9 [facility] wants you to be able to say your pain was always managed very well.  Cephus ShellingBURGER,Britten Parady 06/03/2016

## 2016-06-04 ENCOUNTER — Inpatient Hospital Stay (HOSPITAL_COMMUNITY): Payer: 59 | Admitting: Anesthesiology

## 2016-06-04 LAB — CBC
HEMATOCRIT: 36.2 % (ref 36.0–46.0)
HEMOGLOBIN: 12.4 g/dL (ref 12.0–15.0)
MCH: 27 pg (ref 26.0–34.0)
MCHC: 34.3 g/dL (ref 30.0–36.0)
MCV: 78.9 fL (ref 78.0–100.0)
Platelets: 201 10*3/uL (ref 150–400)
RBC: 4.59 MIL/uL (ref 3.87–5.11)
RDW: 14.8 % (ref 11.5–15.5)
WBC: 17.1 10*3/uL — AB (ref 4.0–10.5)

## 2016-06-04 MED ORDER — FENTANYL 2.5 MCG/ML BUPIVACAINE 1/10 % EPIDURAL INFUSION (WH - ANES)
14.0000 mL/h | INTRAMUSCULAR | Status: DC | PRN
  Administered 2016-06-04 – 2016-06-05 (×2): 14 mL/h via EPIDURAL
  Filled 2016-06-04 (×2): qty 100

## 2016-06-04 MED ORDER — PHENYLEPHRINE 40 MCG/ML (10ML) SYRINGE FOR IV PUSH (FOR BLOOD PRESSURE SUPPORT): 80.0000 ug | PREFILLED_SYRINGE | INTRAVENOUS | Status: DC | PRN

## 2016-06-04 MED ORDER — OXYTOCIN 40 UNITS IN LACTATED RINGERS INFUSION - SIMPLE MED: 1.0000 m[IU]/min | INTRAVENOUS | Status: DC

## 2016-06-04 MED ORDER — ACETAMINOPHEN 500 MG PO TABS
1000.0000 mg | ORAL_TABLET | Freq: Once | ORAL | Status: AC
Start: 2016-06-04 — End: 2016-06-04
  Administered 2016-06-04: 1000 mg via ORAL
  Filled 2016-06-04: qty 2

## 2016-06-04 MED ORDER — LACTATED RINGERS IV SOLN
500.0000 mL | Freq: Once | INTRAVENOUS | Status: AC
  Administered 2016-06-04: 500 mL via INTRAVENOUS

## 2016-06-04 MED ORDER — ACETAMINOPHEN 160 MG/5ML PO SOLN: 1000.0000 mg | Freq: Once | ORAL | Status: DC

## 2016-06-04 MED ORDER — LIDOCAINE HCL (PF) 1 % IJ SOLN
INTRAMUSCULAR | Status: DC | PRN
  Administered 2016-06-04: 2 mL via EPIDURAL
  Administered 2016-06-04: 5 mL via EPIDURAL
  Administered 2016-06-04: 3 mL via EPIDURAL

## 2016-06-04 MED ORDER — PHENYLEPHRINE 40 MCG/ML (10ML) SYRINGE FOR IV PUSH (FOR BLOOD PRESSURE SUPPORT)
80.0000 ug | PREFILLED_SYRINGE | INTRAVENOUS | Status: DC | PRN
  Filled 2016-06-04: qty 10

## 2016-06-04 MED ORDER — DIPHENHYDRAMINE HCL 50 MG/ML IJ SOLN: 12.5000 mg | INTRAMUSCULAR | Status: DC | PRN

## 2016-06-04 MED ORDER — TERBUTALINE SULFATE 1 MG/ML IJ SOLN: 0.2500 mg | Freq: Once | INTRAMUSCULAR | Status: DC | PRN

## 2016-06-04 MED ORDER — OXYTOCIN 40 UNITS IN LACTATED RINGERS INFUSION - SIMPLE MED
1.0000 m[IU]/min | INTRAVENOUS | Status: DC
  Administered 2016-06-04: 6 m[IU]/min via INTRAVENOUS
  Administered 2016-06-05: 1 m[IU]/min via INTRAVENOUS
  Filled 2016-06-04: qty 1000

## 2016-06-04 MED ORDER — EPHEDRINE 5 MG/ML INJ: 10.0000 mg | INTRAVENOUS | Status: DC | PRN

## 2016-06-04 MED ORDER — OXYTOCIN 40 UNITS IN LACTATED RINGERS INFUSION - SIMPLE MED
1.0000 m[IU]/min | INTRAVENOUS | Status: DC
  Administered 2016-06-04: 1 m[IU]/min via INTRAVENOUS
  Filled 2016-06-04: qty 1000

## 2016-06-04 MED ORDER — LACTATED RINGERS IV SOLN: 500.0000 mL | Freq: Once | INTRAVENOUS | Status: DC

## 2016-06-04 MED ORDER — DIPHENHYDRAMINE HCL 25 MG PO CAPS
25.0000 mg | ORAL_CAPSULE | Freq: Every evening | ORAL | Status: DC | PRN
  Administered 2016-06-04: 50 mg via ORAL
  Filled 2016-06-04: qty 2

## 2016-06-04 NOTE — Anesthesia Preprocedure Evaluation (Addendum)
Anesthesia Evaluation  Patient identified by MRN, date of birth, ID band Patient awake    Reviewed: Allergy & Precautions, NPO status , Patient's Chart, lab work & pertinent test results, reviewed documented beta blocker date and time   Airway Mallampati: III  TM Distance: >3 FB Neck ROM: Full    Dental  (+) Teeth Intact, Dental Advisory Given   Pulmonary neg pulmonary ROS,    Pulmonary exam normal breath sounds clear to auscultation       Cardiovascular hypertension, Pt. on home beta blockers Normal cardiovascular exam Rhythm:Regular Rate:Normal  PVCs   Neuro/Psych  Headaches, PSYCHIATRIC DISORDERS Anxiety    GI/Hepatic Neg liver ROS, GERD  ,  Endo/Other  Morbid obesity  Renal/GU negative Renal ROS     Musculoskeletal negative musculoskeletal ROS (+)   Abdominal   Peds  Hematology negative hematology ROS (+) Plt 201k   Anesthesia Other Findings Day of surgery medications reviewed with the patient.  Reproductive/Obstetrics (+) Pregnancy Pre-eclampsia                             Anesthesia Physical Anesthesia Plan  ASA: III and emergent  Anesthesia Plan: Epidural   Post-op Pain Management:    Induction:   Airway Management Planned: Natural Airway  Additional Equipment:   Intra-op Plan:   Post-operative Plan:   Informed Consent: I have reviewed the patients History and Physical, chart, labs and discussed the procedure including the risks, benefits and alternatives for the proposed anesthesia with the patient or authorized representative who has indicated his/her understanding and acceptance.   Dental advisory given  Plan Discussed with: Anesthesiologist, CRNA and Surgeon  Anesthesia Plan Comments: (Patient identified. Risks/Benefits/Options discussed with patient including but not limited to bleeding, infection, nerve damage, paralysis, failed block, incomplete pain control,  headache, blood pressure changes, nausea, vomiting, reactions to medication both or allergic, itching and postpartum back pain. Confirmed with bedside nurse the patient's most recent platelet count. Confirmed with patient that they are not currently taking any anticoagulation, have any bleeding history or any family history of bleeding disorders. Patient expressed understanding and wished to proceed. All questions were answered.   Patient for C/Section for fetal intolerance to labor. Will use Epidural for C/Section. M. Malen GauzeFoster, MD)       Anesthesia Quick Evaluation

## 2016-06-04 NOTE — Progress Notes (Signed)
Subjective: Pt is still comfortable.  Standing on side of bed.  Family support in room.  Objective: BP (!) 161/98   Pulse 78   Temp 98.3 F (36.8 C) (Oral)   Resp 17   Ht 5\' 2"  (1.575 m)   Wt 123.8 kg (273 lb)   LMP 09/04/2015   BMI 49.93 kg/m  No intake/output data recorded. No intake/output data recorded.  FHT: Category 1 UC:   regular, every 2-3 minutes SVE:   Dilation: 4 Effacement (%): 80 Station: -2 Exam by:: N.Khalil Szczepanik, CNM Pitocin at 8 mu AROM clear fluid.  Assessment:  Pt is a G2P0010 at 39.1 induction for chronic hypertension Cat 1 strip Plan: Increase pitocin.  Epidural when needed. Monitor progress  Henderson NewcomerNancy Jean Johnetta Sloniker CNM, MSN 06/04/2016, 1:22 PM

## 2016-06-04 NOTE — Progress Notes (Signed)
Deedra EhrichChandra D Digirolamo MRN: [id number]  Subjective: -Nurse call requests review of strip.  Strip reviewed.  Instructed to place FSE and give O2.  In room to assess.  Patient continues to report rectal pressure.  Informs provider that anxiety is increasing with excess nursing presence.  FOB-Anthony, at bedside.   Objective: BP (!) 111/56   Pulse 87   Temp 98.3 F (36.8 C) (Oral)   Resp 18   Ht 5\' 2"  (1.575 m)   Wt 123.8 kg (273 lb)   LMP 09/04/2015   SpO2 (!) 88%   BMI 49.93 kg/m  I/O last 3 completed shifts: In: -  Out: 950 [Urine:950] No intake/output data recorded.  Fetal Monitoring: FHT: 150 bpm, Mod Var, +Variable/Late Decels, +Accels UC: Q 2-673min, MVUs 65-4180mmHg   Vaginal Exam: SVE:   Dilation: 5 Effacement (%): 90 Station: 0 Exam by:: L.Stubbs, RN Membranes:AROM  Internal Monitors: IUPC flushed and zeroed  Augmentation/Induction: Pitocin:4812mUn/min Cytotec: S/P  Assessment:  IUP at 39.1wks Cat II FT  IOL  Plan: -Dr. Sallyanne HaversEK consulted and updated on patient status.  Advised: *Discontinue pitocin *Allow 2 hours of rest *Restart pitocin at 692mUn/min -Patient and FOB informed of POC -Q/C addressed -Recheck, restart pitocin, and give Vancomycin at Midnight -Give Vistaril for anxiety -Continue other mgmt as appropriate  Valma CavaJessica L Ole Lafon,MSN, CNM 06/04/2016, 10:12 PM

## 2016-06-04 NOTE — Progress Notes (Signed)
Erica EhrichChandra D Ramos MRN: [id number]  Subjective: -Patient requests to speak with provider.  Questions regarding induction process and blood pressure.  Also questions regarding pain management.  Objective: BP (!) 158/88   Pulse 84   Temp 98.8 F (37.1 C) (Oral)   Resp 18   Ht 5\' 2"  (1.575 m)   Wt 123.8 kg (273 lb)   LMP 09/04/2015   BMI 49.93 kg/m  No intake/output data recorded. No intake/output data recorded.  Fetal Monitoring: FHT: 130 bpm, Mod Var, -Decels, +Accels UC: Palpates mild    Vaginal Exam: SVE:   Dilation: 1 Effacement (%): 80 Station: -2 Exam by:: [employer]essica Topher [place] rn Membranes:Intact Internal Monitors: None  Augmentation/Induction: Pitocin:Initiated Cytotec: S/P Foley Bulb in Place  Assessment:  IUP at 39.1wks Cat I FT  IOL CHTN  Plan: -Q/C addressed -Educated on different methods for pain mgmt -Encouraged to rest -Informed that BP elevated, but not in treatable (IV hypertensive) range and must consider pain effects on bp -Start pitocin as ordered -Plan to remove foley bulb at 0830 -Continue other mgmt as ordered   Valma CavaJessica L Marian Meneely,MSN, CNM 06/04/2016, 6:06 AM

## 2016-06-04 NOTE — Progress Notes (Signed)
prothero cnm at bedside, asked rn to not complete cervical exam at this time due to pt  gbs pos status

## 2016-06-04 NOTE — Progress Notes (Signed)
Subjective: Complains of pain with contractions.  Breathing with them.  Requesting epidural. Objective: BP (!) 150/90   Pulse 79   Temp 98.1 F (36.7 C) (Oral)   Resp 16   Ht 5\' 2"  (1.575 m)   Wt 123.8 kg (273 lb)   LMP 09/04/2015   BMI 49.93 kg/m  No intake/output data recorded. No intake/output data recorded.  FHT: Category 1 UC:   regular, every 3 minutes SVE:   Dilation: 4 Effacement (%): 80 Station: -2 Exam by:: N.Ramos, CNM Pitocin at 10 mu SVE no change.  Discussed IUPC placement.  Pt agreed.  IUPC inserted without difficulty.    Assessment:  Pt is a G2P0010 at 39.1 [facility] strip Slow progress  Plan: Pitocin augmentation if needed. Epidural for pain management. Position changes Monitor progress  Erica HousemanNancy Jean Ramos CNM, MSN 06/04/2016, 4:02 PM

## 2016-06-04 NOTE — Anesthesia Procedure Notes (Signed)
Epidural Patient location during procedure: OB Start time: 06/04/2016 4:59 PM End time: 06/04/2016 5:04 PM  Staffing Anesthesiologist: Cecile HearingURK, STEPHEN EDWARD Performed: anesthesiologist   Preanesthetic Checklist Completed: patient identified, pre-op evaluation, timeout performed, IV checked, risks and benefits discussed and monitors and equipment checked  Epidural Patient position: sitting Prep: DuraPrep Patient monitoring: blood pressure and continuous pulse ox Approach: midline Location: L3-L4 Injection technique: LOR air  Needle:  Needle type: Tuohy  Needle gauge: 17 G Needle length: 9 cm Needle insertion depth: 6 cm Catheter size: 19 Gauge Catheter at skin depth: 12 cm Test dose: negative and Other (1% Lidocaine)  Additional Notes Patient identified.  Risk benefits discussed including failed block, incomplete pain control, headache, nerve damage, paralysis, blood pressure changes, nausea, vomiting, reactions to medication both toxic or allergic, and postpartum back pain.  Patient expressed understanding and wished to proceed.  All questions were answered.  Sterile technique used throughout procedure and epidural site dressed with sterile barrier dressing. No paresthesia or other complications noted. The patient did not experience any signs of intravascular injection such as tinnitus or metallic taste in mouth nor signs of intrathecal spread such as rapid motor block. Please see nursing notes for vital signs. Reason for block:procedure for pain

## 2016-06-04 NOTE — Progress Notes (Signed)
Subjective: Feeling more pressure but comfortable with epidural  Objective: BP 138/79   Pulse 88   Temp 99.2 F (37.3 C) (Oral)   Resp 17   Ht 5\' 2"  (1.575 m)   Wt 123.8 kg (273 lb)   LMP 09/04/2015   BMI 49.93 kg/m  No intake/output data recorded. Total I/O In: -  Out: 950 [Urine:950]  FHT: Category 1 UC:   regular, every 3 minutes SVE:   Dilation: 5 Effacement (%): 90 Station: -2 Exam by:: Lemon Whitacre cnm Pitocin at 18mu MVUs 185 mu  Assessment:  Active labor with arom x 6 hours Cat 1 strip GBS positive  Plan: Monitor progress Dr. Sallye OberKulwa updated  Kenney HousemanNancy Jean Gavin Telford CNM, MSN 06/04/2016, 6:53 PM

## 2016-06-04 NOTE — Progress Notes (Signed)
Erica EhrichChandra D Ramos MRN: [id number]  Subjective: -Care assumed.  Patient resting in bed.  Reports comfort with epidural and some rectal pressure. Feels like "something is happening." Denies HA, visual disturbance, and epigastric pain.    Objective: BP (!) 122/52   Pulse 84   Temp 99.2 F (37.3 C) (Oral)   Resp 17   Ht 5\' 2"  (1.575 m)   Wt 123.8 kg (273 lb)   LMP 09/04/2015   BMI 49.93 kg/m  I/O last 3 completed shifts: In: -  Out: 950 [Urine:950] No intake/output data recorded.  Fetal Monitoring: FHT: 135 bpm, Mod Var, -Decels, +Accels UC: Q1-593min, MVUs 150mmHg    Vaginal Exam: SVE:   Dilation: 5 Effacement (%): Edematous Station: 0,+1 Exam by:: Erica Ramos, CNM Membranes:AROM x 7hrs Internal Monitors: IUPC  Augmentation/Induction: Pitocin:2012mUn/min Cytotec: S/P  Assessment:  IUP at 39.1wks Cat I FT  IOL CHTN  Plan: -Position change to promote fetal rotation -Encouraged to rest -Continue other mgmt as ordered  Valma CavaJessica L Shakeeta Godette,MSN, CNM 06/04/2016, 8:15 PM

## 2016-06-04 NOTE — Progress Notes (Signed)
Subjective: Pt is a 41 yo G2P0010 at 39.1 IUP induction for hypertension on medication.  GBS positive being treated.  Feeling pressure for foley still tight.  Denies headache blurred vision epigastric pain. Objective: BP (!) 146/81   Pulse 90   Temp 98.5 F (36.9 C) (Oral)   Resp 18   Ht 5\' 2"  (1.575 m)   Wt 123.8 kg (273 lb)   LMP 09/04/2015   BMI 49.93 kg/m  No intake/output data recorded. No intake/output data recorded.  FHT: Category 1 UC:   irregular, every 2 in 10 minutes SVE:   Dilation: 3.5 Effacement (%): 80 Station: -3 Exam by:: N. Prothero, CNM Pitocin at 1 Foley removed without difficulty.  Assessment:  Pt is a G2P0010 at 39.1 induction for chronic hypertension Cat 1 strip GBS positive  Hx of HSV Anxiety  Plan: Increase pitocin, AROM when head engaged.   Monitor progress   Kenney HousemanNancy Jean Prothero CNM, MSN 06/04/2016, 8:54 AM

## 2016-06-05 ENCOUNTER — Encounter (HOSPITAL_COMMUNITY): Payer: Self-pay

## 2016-06-05 ENCOUNTER — Encounter (HOSPITAL_COMMUNITY)
Admission: RE | Disposition: A | Discharge status: Home / Self Care | Payer: Self-pay | Source: Ambulatory Visit | Attending: Obstetrics and Gynecology

## 2016-06-05 LAB — CBC
HCT: 34.4 % — ABNORMAL LOW (ref 36.0–46.0)
Hemoglobin: 11.8 g/dL — ABNORMAL LOW (ref 12.0–15.0)
MCH: 27.2 pg (ref 26.0–34.0)
MCHC: 34.3 g/dL (ref 30.0–36.0)
MCV: 79.3 fL (ref 78.0–100.0)
PLATELETS: 213 10*3/uL (ref 150–400)
RBC: 4.34 MIL/uL (ref 3.87–5.11)
RDW: 14.8 % (ref 11.5–15.5)
WBC: 22.2 10*3/uL — ABNORMAL HIGH (ref 4.0–10.5)

## 2016-06-05 SURGERY — Surgical Case
Anesthesia: Epidural | Site: Abdomen

## 2016-06-05 MED ORDER — IBUPROFEN 600 MG PO TABS
600.0000 mg | ORAL_TABLET | Freq: Four times a day (QID) | ORAL | Status: DC
  Administered 2016-06-05 – 2016-06-07 (×7): 600 mg via ORAL
  Filled 2016-06-05 (×9): qty 1

## 2016-06-05 MED ORDER — PRENATAL MULTIVITAMIN CH
1.0000 | ORAL_TABLET | Freq: Every day | ORAL | Status: DC
  Administered 2016-06-06 – 2016-06-07 (×2): 1 via ORAL
  Filled 2016-06-05 (×3): qty 1

## 2016-06-05 MED ORDER — EPHEDRINE 5 MG/ML INJ
INTRAVENOUS | Status: AC
  Filled 2016-06-05: qty 10

## 2016-06-05 MED ORDER — DIPHENHYDRAMINE HCL 50 MG/ML IJ SOLN: 12.5000 mg | INTRAMUSCULAR | Status: DC | PRN

## 2016-06-05 MED ORDER — ENOXAPARIN SODIUM 60 MG/0.6ML ~~LOC~~ SOLN: 0.5000 mg/kg | SUBCUTANEOUS | Status: DC

## 2016-06-05 MED ORDER — SCOPOLAMINE 1 MG/3DAYS TD PT72
MEDICATED_PATCH | TRANSDERMAL | Status: DC | PRN
  Administered 2016-06-05: 1 via TRANSDERMAL

## 2016-06-05 MED ORDER — DIPHENHYDRAMINE HCL 25 MG PO CAPS: 25.0000 mg | ORAL_CAPSULE | Freq: Four times a day (QID) | ORAL | Status: DC | PRN

## 2016-06-05 MED ORDER — MEPERIDINE HCL 25 MG/ML IJ SOLN: 6.2500 mg | INTRAMUSCULAR | Status: DC | PRN

## 2016-06-05 MED ORDER — FENTANYL CITRATE (PF) 100 MCG/2ML IJ SOLN: 25.0000 ug | INTRAMUSCULAR | Status: DC | PRN

## 2016-06-05 MED ORDER — NALBUPHINE HCL 10 MG/ML IJ SOLN: 5.0000 mg | INTRAMUSCULAR | Status: DC | PRN

## 2016-06-05 MED ORDER — OXYTOCIN 10 UNIT/ML IJ SOLN
INTRAMUSCULAR | Status: AC
Start: 2016-06-05 — End: 2016-06-05
  Filled 2016-06-05: qty 5

## 2016-06-05 MED ORDER — ZOLPIDEM TARTRATE 5 MG PO TABS: 5.0000 mg | ORAL_TABLET | Freq: Every evening | ORAL | Status: DC | PRN

## 2016-06-05 MED ORDER — NALBUPHINE HCL 10 MG/ML IJ SOLN: 5.0000 mg | Freq: Once | INTRAMUSCULAR | Status: DC | PRN

## 2016-06-05 MED ORDER — SIMETHICONE 80 MG PO CHEW
80.0000 mg | CHEWABLE_TABLET | ORAL | Status: DC
  Administered 2016-06-05 – 2016-06-06 (×2): 80 mg via ORAL
  Filled 2016-06-05 (×2): qty 1

## 2016-06-05 MED ORDER — PHENYLEPHRINE 40 MCG/ML (10ML) SYRINGE FOR IV PUSH (FOR BLOOD PRESSURE SUPPORT)
PREFILLED_SYRINGE | INTRAVENOUS | Status: AC
  Filled 2016-06-05: qty 10

## 2016-06-05 MED ORDER — NALOXONE HCL 0.4 MG/ML IJ SOLN: 0.4000 mg | INTRAMUSCULAR | Status: DC | PRN

## 2016-06-05 MED ORDER — NALOXONE HCL 2 MG/2ML IJ SOSY
1.0000 ug/kg/h | PREFILLED_SYRINGE | INTRAVENOUS | Status: DC | PRN
  Filled 2016-06-05: qty 2

## 2016-06-05 MED ORDER — DIBUCAINE 1 % RE OINT
1.0000 "application " | TOPICAL_OINTMENT | RECTAL | Status: DC | PRN
  Administered 2016-06-07: 1 via RECTAL
  Filled 2016-06-05: qty 28

## 2016-06-05 MED ORDER — EPHEDRINE SULFATE 50 MG/ML IJ SOLN
INTRAMUSCULAR | Status: DC | PRN
  Administered 2016-06-05 (×2): 5 mg via INTRAVENOUS

## 2016-06-05 MED ORDER — MENTHOL 3 MG MT LOZG: 1.0000 | LOZENGE | OROMUCOSAL | Status: DC | PRN

## 2016-06-05 MED ORDER — DEXAMETHASONE SODIUM PHOSPHATE 4 MG/ML IJ SOLN
INTRAMUSCULAR | Status: AC
Start: 2016-06-05 — End: 2016-06-05
  Filled 2016-06-05: qty 1

## 2016-06-05 MED ORDER — OXYCODONE-ACETAMINOPHEN 5-325 MG PO TABS
2.0000 | ORAL_TABLET | ORAL | Status: DC | PRN
  Administered 2016-06-06 – 2016-06-07 (×2): 2 via ORAL
  Filled 2016-06-05 (×2): qty 2

## 2016-06-05 MED ORDER — SIMETHICONE 80 MG PO CHEW
80.0000 mg | CHEWABLE_TABLET | Freq: Three times a day (TID) | ORAL | Status: DC
  Administered 2016-06-05 – 2016-06-07 (×4): 80 mg via ORAL
  Filled 2016-06-05 (×4): qty 1

## 2016-06-05 MED ORDER — PHENYLEPHRINE HCL 10 MG/ML IJ SOLN
INTRAMUSCULAR | Status: DC | PRN
  Administered 2016-06-05 (×2): 80 ug via INTRAVENOUS

## 2016-06-05 MED ORDER — SODIUM CHLORIDE 0.9% FLUSH: 3.0000 mL | INTRAVENOUS | Status: DC | PRN

## 2016-06-05 MED ORDER — ENOXAPARIN SODIUM 40 MG/0.4ML ~~LOC~~ SOLN: 40.0000 mg | SUBCUTANEOUS | Status: DC

## 2016-06-05 MED ORDER — DIPHENHYDRAMINE HCL 25 MG PO CAPS
25.0000 mg | ORAL_CAPSULE | ORAL | Status: DC | PRN
  Filled 2016-06-05: qty 1

## 2016-06-05 MED ORDER — COCONUT OIL OIL
1.0000 "application " | TOPICAL_OIL | Status: DC | PRN
  Administered 2016-06-07: 1 via TOPICAL
  Filled 2016-06-05: qty 120

## 2016-06-05 MED ORDER — LACTATED RINGERS IV SOLN: INTRAVENOUS | Status: DC

## 2016-06-05 MED ORDER — NIFEDIPINE ER OSMOTIC RELEASE 30 MG PO TB24
30.0000 mg | ORAL_TABLET | Freq: Every day | ORAL | Status: DC
  Administered 2016-06-05 – 2016-06-07 (×2): 30 mg via ORAL
  Filled 2016-06-05 (×4): qty 1

## 2016-06-05 MED ORDER — OXYCODONE-ACETAMINOPHEN 5-325 MG PO TABS
1.0000 | ORAL_TABLET | ORAL | Status: DC | PRN
  Administered 2016-06-06: 1 via ORAL
  Filled 2016-06-05 (×2): qty 1

## 2016-06-05 MED ORDER — SENNOSIDES-DOCUSATE SODIUM 8.6-50 MG PO TABS
2.0000 | ORAL_TABLET | ORAL | Status: DC
  Administered 2016-06-05 – 2016-06-06 (×2): 2 via ORAL
  Filled 2016-06-05 (×2): qty 2

## 2016-06-05 MED ORDER — IBUPROFEN 600 MG PO TABS
600.0000 mg | ORAL_TABLET | Freq: Four times a day (QID) | ORAL | Status: DC | PRN
  Administered 2016-06-06 (×2): 600 mg via ORAL

## 2016-06-05 MED ORDER — KETOROLAC TROMETHAMINE 30 MG/ML IJ SOLN: 30.0000 mg | Freq: Four times a day (QID) | INTRAMUSCULAR | Status: AC | PRN

## 2016-06-05 MED ORDER — ONDANSETRON HCL 4 MG/2ML IJ SOLN
INTRAMUSCULAR | Status: DC | PRN
  Administered 2016-06-05: 4 mg via INTRAVENOUS

## 2016-06-05 MED ORDER — WITCH HAZEL-GLYCERIN EX PADS
1.0000 "application " | MEDICATED_PAD | CUTANEOUS | Status: DC | PRN
  Administered 2016-06-07: 1 via TOPICAL

## 2016-06-05 MED ORDER — ACETAMINOPHEN 325 MG PO TABS: 650.0000 mg | ORAL_TABLET | ORAL | Status: DC | PRN

## 2016-06-05 MED ORDER — ACETAMINOPHEN 500 MG PO TABS
1000.0000 mg | ORAL_TABLET | Freq: Four times a day (QID) | ORAL | Status: AC
  Administered 2016-06-05 (×3): 1000 mg via ORAL
  Filled 2016-06-05 (×3): qty 2

## 2016-06-05 MED ORDER — DEXAMETHASONE SODIUM PHOSPHATE 4 MG/ML IJ SOLN
INTRAMUSCULAR | Status: DC | PRN
  Administered 2016-06-05: 4 mg via INTRAVENOUS

## 2016-06-05 MED ORDER — DEXTROSE 5 % IV SOLN: 3.0000 g | INTRAVENOUS | Status: DC

## 2016-06-05 MED ORDER — ONDANSETRON HCL 4 MG/2ML IJ SOLN: 4.0000 mg | Freq: Three times a day (TID) | INTRAMUSCULAR | Status: DC | PRN

## 2016-06-05 MED ORDER — LACTATED RINGERS IV SOLN
INTRAVENOUS | Status: DC
  Administered 2016-06-05: 07:00:00 via INTRAVENOUS

## 2016-06-05 MED ORDER — KETOROLAC TROMETHAMINE 30 MG/ML IJ SOLN
INTRAMUSCULAR | Status: AC
  Filled 2016-06-05: qty 1

## 2016-06-05 MED ORDER — KETOROLAC TROMETHAMINE 30 MG/ML IJ SOLN
30.0000 mg | Freq: Four times a day (QID) | INTRAMUSCULAR | Status: AC | PRN
Start: 2016-06-05 — End: 2016-06-06
  Administered 2016-06-05: 30 mg via INTRAMUSCULAR

## 2016-06-05 MED ORDER — MORPHINE SULFATE (PF) 0.5 MG/ML IJ SOLN
INTRAMUSCULAR | Status: DC | PRN
  Administered 2016-06-05: 4 mg via EPIDURAL

## 2016-06-05 MED ORDER — LACTATED RINGERS IV SOLN
INTRAVENOUS | Status: DC | PRN
  Administered 2016-06-05 (×2): via INTRAVENOUS

## 2016-06-05 MED ORDER — ONDANSETRON HCL 4 MG/2ML IJ SOLN
INTRAMUSCULAR | Status: AC
  Filled 2016-06-05: qty 2

## 2016-06-05 MED ORDER — CLINDAMYCIN PHOSPHATE 900 MG/50ML IV SOLN
900.0000 mg | Freq: Once | INTRAVENOUS | Status: AC
  Administered 2016-06-05: 900 mg via INTRAVENOUS
  Filled 2016-06-05: qty 50

## 2016-06-05 MED ORDER — SCOPOLAMINE 1 MG/3DAYS TD PT72
MEDICATED_PATCH | TRANSDERMAL | Status: AC
  Filled 2016-06-05: qty 1

## 2016-06-05 MED ORDER — MORPHINE SULFATE (PF) 0.5 MG/ML IJ SOLN
INTRAMUSCULAR | Status: AC
  Filled 2016-06-05: qty 10

## 2016-06-05 MED ORDER — OXYTOCIN 40 UNITS IN LACTATED RINGERS INFUSION - SIMPLE MED: 2.5000 [IU]/h | INTRAVENOUS | Status: AC

## 2016-06-05 MED ORDER — SIMETHICONE 80 MG PO CHEW: 80.0000 mg | CHEWABLE_TABLET | ORAL | Status: DC | PRN

## 2016-06-05 MED ORDER — ENOXAPARIN SODIUM 60 MG/0.6ML ~~LOC~~ SOLN
0.5000 mg/kg | SUBCUTANEOUS | Status: DC
  Administered 2016-06-06: 09:00:00 60 mg via SUBCUTANEOUS
  Filled 2016-06-05 (×3): qty 0.6

## 2016-06-05 SURGICAL SUPPLY — 43 items
CHLORAPREP W/TINT 26ML (MISCELLANEOUS) ×3 IMPLANT
CLAMP CORD UMBIL (MISCELLANEOUS) IMPLANT
CLOTH BEACON ORANGE TIMEOUT ST (SAFETY) ×3 IMPLANT
DERMABOND ADHESIVE PROPEN (GAUZE/BANDAGES/DRESSINGS) ×2
DERMABOND ADVANCED (GAUZE/BANDAGES/DRESSINGS)
DERMABOND ADVANCED .7 DNX12 (GAUZE/BANDAGES/DRESSINGS) IMPLANT
DERMABOND ADVANCED .7 DNX6 (GAUZE/BANDAGES/DRESSINGS) ×1 IMPLANT
DRAPE C SECTION CLR SCREEN (DRAPES) ×6 IMPLANT
DRSG OPSITE POSTOP 4X10 (GAUZE/BANDAGES/DRESSINGS) ×3 IMPLANT
DRSG PAD ABDOMINAL 8X10 ST (GAUZE/BANDAGES/DRESSINGS) ×3 IMPLANT
ELECT REM PT RETURN 9FT ADLT (ELECTROSURGICAL) ×3
ELECTRODE REM PT RTRN 9FT ADLT (ELECTROSURGICAL) ×1 IMPLANT
EXTENDER TRAXI PANNICULUS (MISCELLANEOUS) ×1 IMPLANT
EXTRACTOR VACUUM M CUP 4 TUBE (SUCTIONS) IMPLANT
EXTRACTOR VACUUM M CUP 4' TUBE (SUCTIONS)
GLOVE BIOGEL PI IND STRL 7.0 (GLOVE) ×2 IMPLANT
GLOVE BIOGEL PI INDICATOR 7.0 (GLOVE) ×4
GLOVE SURG SS PI 6.5 STRL IVOR (GLOVE) ×3 IMPLANT
GOWN STRL REUS W/TWL LRG LVL3 (GOWN DISPOSABLE) ×6 IMPLANT
KIT ABG SYR 3ML LUER SLIP (SYRINGE) IMPLANT
NEEDLE HYPO 25X5/8 SAFETYGLIDE (NEEDLE) IMPLANT
NS IRRIG 1000ML POUR BTL (IV SOLUTION) ×3 IMPLANT
PACK C SECTION WH (CUSTOM PROCEDURE TRAY) ×3 IMPLANT
PAD OB MATERNITY 4.3X12.25 (PERSONAL CARE ITEMS) ×3 IMPLANT
PENCIL SMOKE EVAC W/HOLSTER (ELECTROSURGICAL) ×3 IMPLANT
RETRACTOR TRAXI PANNICULUS (MISCELLANEOUS) ×1 IMPLANT
RTRCTR C-SECT PINK 25CM LRG (MISCELLANEOUS) ×3 IMPLANT
SPONGE GAUZE 4X4 12PLY STER LF (GAUZE/BANDAGES/DRESSINGS) ×3 IMPLANT
SUT CHROMIC 1 CTX 36 (SUTURE) IMPLANT
SUT CHROMIC 2 0 CT 1 (SUTURE) ×3 IMPLANT
SUT MON AB 4-0 PS1 27 (SUTURE) ×3 IMPLANT
SUT PLAIN 1 NONE 54 (SUTURE) IMPLANT
SUT PLAIN 2 0 (SUTURE)
SUT PLAIN 2 0 XLH (SUTURE) ×3 IMPLANT
SUT PLAIN ABS 2-0 CT1 27XMFL (SUTURE) IMPLANT
SUT VIC AB 0 CTX 36 (SUTURE) ×4
SUT VIC AB 0 CTX36XBRD ANBCTRL (SUTURE) ×2 IMPLANT
SUT VIC AB 1 CTX 36 (SUTURE) ×4
SUT VIC AB 1 CTX36XBRD ANBCTRL (SUTURE) ×2 IMPLANT
TOWEL OR 17X24 6PK STRL BLUE (TOWEL DISPOSABLE) ×3 IMPLANT
TRAXI PANNICULUS EXTENDER (MISCELLANEOUS) ×2
TRAXI PANNICULUS RETRACTOR (MISCELLANEOUS) ×2
TRAY FOLEY CATH SILVER 14FR (SET/KITS/TRAYS/PACK) ×3 IMPLANT

## 2016-06-05 NOTE — Interval H&P Note (Signed)
History and Physical Interval Note:  06/05/2016 4:06 AM  Erica Ramos  has presented today for surgery, with the diagnosis of Fetal intolerance of labor- prolonged decelerations on pitocin, remote from delivery at 6 cm dilation.  The various methods of treatment have been discussed with the patient and family. After consideration of risks, benefits and other options for treatment, the patient has consented to  Procedure(s): CESAREAN SECTION (N/A) as a surgical intervention .  The patient's history has been reviewed, patient examined, no change in status, stable for surgery.  I have reviewed the patient's chart and labs.  Questions were answered to the patient's satisfaction.     Konrad FelixKULWA,Kemet Nijjar WAKURU, MD.

## 2016-06-05 NOTE — Progress Notes (Addendum)
Erica Ramos MRN: [id number]  Subjective: -Strip reviewed.  In room to assess.  Patient resting in bed.  Reports onset of rectal pressure, but intermittent in nature.    Objective: BP (!) 131/59   Pulse (!) 53   Temp 98.8 F (37.1 C) (Oral)   Resp 16   Ht 5' 2" (1.575 m)   Wt 123.8 kg (273 lb)   LMP 09/04/2015   SpO2 (!) 87%   BMI 49.93 kg/m  I/O last 3 completed shifts: In: -  Out: 950 [Urine:950] No intake/output data recorded.  Fetal Monitoring: FHT: 155 bpm, Mod Var, +Occ Variable Decels, +Accels UC: Q4-5min, palpates mild    Vaginal Exam: SVE:   Dilation: 6 Effacement (%): 90 Station: 0 Exam by:: L.Stubbs, RN Membranes:AROM Internal Monitors: IUPC  Augmentation/Induction: Pitocin:1mUn/min  Cytotec: S/P  Assessment:  IUP at 39.2wks Cat I FT Overall IOL  Plan: -Pitocin titration as appropriate -Position change as appropriate -Continue other mgmt as ordered   Erica Peraza L Brodrick Curran,MSN, CNM 06/05/2016, 2:47 AM   Addendum (0315) Nurse call reports prolonged deceleration Dr. EK consulted and recommends c/s In room to discuss and recommend primary c/s for fetal intolerance R/B reviewed including, but not limited to, infection, bleeding, pain, damage to organs or fetus resulting in need for additional surgery. Patient understands and accepts these risks and wishes to proceed with c/s. Dr. EK en route  Erica Severs L Etter Royall MSN, CNM 06/05/2016 3:26 AM    

## 2016-06-05 NOTE — Lactation Note (Signed)
This note was copied from a baby's chart. Lactation Consultation Note  Patient Name: Boy Inda CokeChandra Dibello JXBJY'NToday's Date: 06/05/2016 Reason for consult: Initial assessment Breastfeeding consultation services and support information given and reviewed.  This is mom's first baby and newborn is 1059 hours old.  Mom states baby has been to breast a few times and latch was easy.  Baby currently sleeping in crib but mom thought she recently saw some feeding cues.  Baby placed skin to skin using football hold.  Baby very sleepy.  Hand expression done with no colostrum seen.  Baby showing no interest in feeding and placed skin to skin with mom.  Encouraged to call with concerns/assist.  Maternal Data Has patient been taught Hand Expression?: Yes Does the patient have breastfeeding experience prior to this delivery?: No  Feeding Feeding Type: Breast Fed  LATCH Score/Interventions Latch: Too sleepy or reluctant, no latch achieved, no sucking elicited. Intervention(s): Adjust position;Assist with latch;Breast massage;Breast compression  Audible Swallowing: None  Type of Nipple: Everted at rest and after stimulation  Comfort (Breast/Nipple): Soft / non-tender     Hold (Positioning): Assistance needed to correctly position infant at breast and maintain latch. Intervention(s): Breastfeeding basics reviewed;Support Pillows;Position options;Skin to skin  LATCH Score: 5  Lactation Tools Discussed/Used     Consult Status Consult Status: Follow-up Date: 06/06/16 Follow-up type: In-patient    Huston FoleyMOULDEN, Berkley Cronkright S 06/05/2016, 2:20 PM

## 2016-06-05 NOTE — Clinical Social Work Maternal (Signed)
CLINICAL SOCIAL WORK MATERNAL/CHILD NOTE  Patient Details  Name: Erica Ramos MRN: [id number] Date of Birth: 04/30/1976  Date:  07-28-16  Clinical Social Worker Initiating Note:  Ferdinand Lango Latoiya Maradiaga, MSW, LCSW-A   Date/ Time Initiated:  10/06/16/1044              Child's Name:  Erica Ramos    Legal Guardian:  Other (Comment) (Not established by court system; MOB and FOB parent collectively )   Need for Interpreter:  None   Date of Referral:  12-24-16     Reason for Referral:  Other (Comment) (MOB hx of anxiety )   Referral Source:  RN   Address:  [address] [place] [address]  Phone number:  [phone]   Household Members: Self   Natural Supports (not living in the home): Immediate Family, Friends, Extended Family   Professional Supports:None   Employment:Full-time   Type of Work:     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   Strengths: Ability to meet basic needs , Compliance with medical plan , Home prepared for child , Pediatrician chosen  Airline pilot )   Risk Factors/Current Problems: None   Cognitive State: Alert , Able to Concentrate , Goal Oriented , Insightful    Mood/Affect: Calm , Comfortable , Relaxed , Interested    CSW Assessment:CSW met with MOB at bedside to complete assessment for consult regarding hx of anxiety. Upon this writer's arrival, MOB was in bed breast feeding baby. MOB was welcoming of this writer's arrival. This Probation officer explained role and reasoning for visit. At this time, MOB confirms hx of anxiety; however, notes it was situational anxiety after she experienced the death of a loved one. MOB notes she experienced panic attacks during that time several years ago. MOB further notes she sought out outpatient services through her jobs [employer]  program. MOB notes therapy helped her a lot and she was able to self-cope. She notes being prescribed medication as needed but does not depend on them. This Probation officer praised MOB for being self -aware of her mental health and seeking out services to help. MOB was thankful. This Probation officer educated MOB on PPD and safe sleeping/SIDS. MOB verbalized understanding. This Probation officer assessed MOB's present feelings/emotions upon babys arrival. MOB notes she is happy and excited. She notes she has a good support system in place to help her in case she ever needs. This Probation officer offered MOB referrals to community resources/support. MOB denies at this time. CSW feels MOB is equip to care for baby and has no concerns for d/c; thus, case closed to this CSW.   CSW Plan/Description: Engineer, mining , No Further Intervention Required/No Barriers to Discharge    Oda Cogan, MSW, [facility]  Office: [phone]

## 2016-06-05 NOTE — Anesthesia Postprocedure Evaluation (Signed)
Anesthesia Post Note  Patient: Erica Ramos  Procedure(s) Performed: Procedure(s) (LRB): CESAREAN SECTION (N/A)  Patient location during evaluation: PACU Anesthesia Type: Epidural Level of consciousness: awake and alert and oriented Pain management: pain level controlled Vital Signs Assessment: post-procedure vital signs reviewed and stable Respiratory status: spontaneous breathing, nonlabored ventilation and respiratory function stable Cardiovascular status: blood pressure returned to baseline and stable Postop Assessment: no backache, no headache, epidural receding, patient able to bend at knees and no signs of nausea or vomiting Anesthetic complications: no        Last Vitals:  Vitals:   06/05/16 0615 06/05/16 0630  BP: (!) 142/89 (!) 157/85  Pulse: 91 96  Resp: 18 19  Temp: 37.8 C     Last Pain:  Vitals:   06/05/16 0630  TempSrc:   PainSc: 7    Pain Goal: Patients Stated Pain Goal: 4 (06/03/16 2105)               Ziair Penson A.

## 2016-06-05 NOTE — Lactation Note (Addendum)
This note was copied from a baby's chart. Lactation Consultation Note  Patient Name: Boy Inda CokeChandra Laski WGNFA'OToday's Date: 06/05/2016   Mom called out for assist. I assisted "Ethelene Brownsnthony, III" in latching in a sitting football hold position. Mom was comfortable w/latch. Mom was taught signs/sound of swallowing (although none noted during my time in the room).   Specifics of an asymmetric latch shown via [employer] website animation. Mom reports + breast changes w/pregnancy. At 15 hours of life, infant had already been to the breast 7 times.   Mom is taking metoprolol 100mg  (L2) and nifedipine 30mg  (L2). Lurline HareRichey, [place] Agostinelli [facility] 06/05/2016, 8:55 PM

## 2016-06-05 NOTE — H&P (View-Only) (Signed)
Erica EhrichChandra D Doucet MRN: [id number]  Subjective: -Strip reviewed.  In room to assess.  Patient resting in bed.  Reports onset of rectal pressure, but intermittent in nature.    Objective: BP (!) 131/59   Pulse (!) 53   Temp 98.8 F (37.1 C) (Oral)   Resp 16   Ht 5\' 2"  (1.575 m)   Wt 123.8 kg (273 lb)   LMP 09/04/2015   SpO2 (!) 87%   BMI 49.93 kg/m  I/O last 3 completed shifts: In: -  Out: 950 [Urine:950] No intake/output data recorded.  Fetal Monitoring: FHT: 155 bpm, Mod Var, +Occ Variable Decels, +Accels UC: Q4-775min, palpates mild    Vaginal Exam: SVE:   Dilation: 6 Effacement (%): 90 Station: 0 Exam by:: L.Stubbs, RN Membranes:AROM Internal Monitors: IUPC  Augmentation/Induction: Pitocin:701mUn/min  Cytotec: S/P  Assessment:  IUP at 39.2wks Cat I FT Overall IOL  Plan: -Pitocin titration as appropriate -Position change as appropriate -Continue other mgmt as ordered   Valma CavaJessica L Keandria Berrocal,MSN, CNM 06/05/2016, 2:47 AM   Addendum ([id number]) Nurse call reports prolonged deceleration Dr. Sallyanne HaversEK consulted and recommends c/s In room to discuss and recommend primary c/s for fetal intolerance R/B reviewed including, but not limited to, infection, bleeding, pain, damage to organs or fetus resulting in need for additional surgery. Patient understands and accepts these risks and wishes to proceed with c/s. Dr. Sallyanne HaversEK en route  Cherre RobinsJessica L Lasalle Abee MSN, CNM 06/05/2016 3:26 AM

## 2016-06-05 NOTE — Anesthesia Postprocedure Evaluation (Signed)
Anesthesia Post Note  Patient: Erica Ramos  Procedure(s) Performed: Procedure(s) (LRB): CESAREAN SECTION (N/A)  Patient location during evaluation: Mother Baby Anesthesia Type: Epidural Level of consciousness: awake and awake and alert Pain management: pain level controlled Vital Signs Assessment: post-procedure vital signs reviewed and stable Respiratory status: spontaneous breathing Cardiovascular status: stable Postop Assessment: no headache, no backache, epidural receding and patient able to bend at knees Anesthetic complications: no        Last Vitals:  Vitals:   06/05/16 0930 06/05/16 1031  BP: 132/89 124/63  Pulse: 92 85  Resp: 18   Temp: 36.8 C 36.9 C    Last Pain:  Vitals:   06/05/16 1031  TempSrc: Axillary  PainSc: 0-No pain   Pain Goal: Patients Stated Pain Goal: 4 (06/05/16 0930)               Edison PaceWILKERSON,[name]

## 2016-06-05 NOTE — Transfer of Care (Signed)
Immediate Anesthesia Transfer of Care Note  Patient: Erica EhrichChandra D Ramos  Procedure(s) Performed: Procedure(s): CESAREAN SECTION (N/A)  Patient Location: PACU  Anesthesia Type:Epidural  Level of Consciousness: awake, alert  and oriented  Airway & Oxygen Therapy: Patient Spontanous Breathing  Post-op Assessment: Report given to RN and Post -op Vital signs reviewed and stable  Post vital signs: Reviewed and stable  Last Vitals:  Vitals:   06/05/16 0144 06/05/16 0202  BP: (!) 113/48 (!) 131/59  Pulse: (!) 102 (!) 53  Resp:    Temp: 37.1 C     Last Pain:  Vitals:   06/05/16 0144  TempSrc: Oral  PainSc:       Patients Stated Pain Goal: 4 (06/03/16 2105)  Complications: No apparent anesthesia complications

## 2016-06-05 NOTE — Op Note (Signed)
Patient: Erica Ramos, Erica Ramos DOB: Dec 07, 1975 MRN:   [id number]    DATE OF SURGERY:  06/05/2016   PREOP DIAGNOSIS:  1. 39 week 2 day EGA intrauterine pregnancy 2.  Induction of labor for chronic hypertension.  3.  Fetal intolerance of labor with recurrent prolonged decelerations.   4.  Remote from delivery at 6 cm dilation.     5. Morbid obesity. BMI 50.    POSTOP DIAGNOSIS: Same as above.  PROCEDURE: Primary low uterine segment transverse cesarean section via Pfannenstiel incision.     SURGEON: Dr.  Angus Palms Sallye Ober  ASSISTANT:  Gerrit Heck, CNM  ANESTHESIA: Epidural, Dr. Malen Gauze   COMPLICATIONS: None  FINDINGS: Viable female infant in cephalic presentation, DOP position, weight  9 pounds 1.3 ounces, Apgar scores of 9 and 9. Normal uterus.  Normal left fallopian tubes and ovaries by palpation.  Right adnexa not well visualized or palpated due to obstructing bowel.      EBL:  800 cc  IV FLUID:  2300 cc LR   URINE OUTPUT:75 cc concentrated urine  INDICATIONS: 40y/o P0 who presented for labor induction.  Fetus was noted to have recurrent prolonged accelerations on pitocin at 6cm.  As she was remote from delivery and with abnormal fetal heart tracing it was decided to proceed with a primary cesarean section.  She was consented for the procedure after explaining risks benefits and alternatives of the procedure including risks of bleeding, infection and damage to organs.    PROCEDURE:   She was taken to the operating room where epidural anesthesia was administered without difficulty.  Preoperative antibiotic of clindamycin (patient with allergy to penicillin and neomycin) was administered, previously she had been receiving vancomycin for GBS prophylaxis.  She was placed in the supine position with foley catheter and her abdomen and vagina were prepped in the usual sterile fashion.  Abdomen was taped up with the traxis.     A Pfannenstiel incision was made with the scalpel and the incision extended  through the subcutaneous layer and also the fascia with the bovie.  The fascia was nicked in the midline and then was further separated from the rectus muscles bilaterally using Mayo scissors. Kochers were placed inferiorly and then superiorly to allow further separation of fascia from the rectus muscles.  The peritoneal cavity was entered bluntly with the fingers. The Alexis retractor was placed in. The bladder flap was created using Metzenbaum scissors.   The uterus was incised with a scalpel and the incision extended bluntly bilaterally with fingers. Clear amniotic fluid was noted.  The head then the rest of the body was then delivered with abdominal pressure atraumatically.  She delivered a viable female infant, apgar scores 9, 9.  Cord blood was collected.    The uterus was not  exteriorized.  The placenta was delivered with gentle traction on the umbilical cord. The edges of the uterus was grasped with T. Clamps. The uterus was cleared of clots and debris with a lap.  The uterine incision was closed with #1 Vicryl in a running locked stitch. An imbricating layer of the same stitch was placed over the initial closure.  Irrigation was applied and suctioned out. Two areas in the middle of incision that were bleeding were contained with 0-vicryl sutures.  Excellent hemostasis was noted over the uterine incision.  The muscles were then reapproximated using chromic suture in interrupted stitches.  Fascia was closed using 0 Vicryl in a running stitch. The subcutaneous layer was irrigated and suctioned out.  Small perforators were contained with the bovie.  The subcutaneous was closed over using 1-0 plain in interrupted stitches. The skin was closed using 4-0 Monocryl. Dermabond was applied. Honeycomb and pressure dressing were then applied. The patient was then cleaned and she was taken to the recovery room in stable condition. The neonate was also taken to the nursery in stable condition.   SPECIMEN: Placenta  to pathology, umbilical cord blood to lab  DISPOSITION: TO PACU, STABLE.   Dr. Sallye OberKulwa, MD.

## 2016-06-05 NOTE — Brief Op Note (Signed)
06/03/2016 - 06/05/2016  6:25 AM  PATIENT:  Erica Ramos  41 y.o. female  PRE-OPERATIVE DIAGNOSIS:  cesarean section fetal intolerance to labor  POST-OPERATIVE DIAGNOSIS:  cesarean section fetal intolerance to labor  PROCEDURE:  Procedure(s): CESAREAN SECTION (N/A)  SURGEON:  Surgeon(s) and Role:    * Hoover BrownsEma Chandrika Sandles, MD - Primary  ASSISTANTS: Gerrit HeckJessica Emly, CNM  ANESTHESIA:   Epidural  EBL:  Total I/O In: 1500 [I.V.:1500] Out: 875 [Urine:75; Blood:800]  BLOOD ADMINISTERED:none  DRAINS: none   LOCAL MEDICATIONS USED:  NONE  SPECIMEN:  Source of Specimen:  Placenta, Cord blood.   DISPOSITION OF SPECIMEN:  PATHOLOGY  COUNTS:  YES  TOURNIQUET:  * No tourniquets in log *  DICTATION: .Dragon Dictation  PLAN OF CARE: Admit to inpatient   PATIENT DISPOSITION:  PACU - hemodynamically stable.   Delay start of Pharmacological VTE agent (>24hrs) due to surgical blood loss or risk of bleeding: not applicable

## 2016-06-06 ENCOUNTER — Encounter (HOSPITAL_COMMUNITY): Payer: Self-pay | Admitting: Obstetrics & Gynecology

## 2016-06-06 LAB — CBC
HEMATOCRIT: 31 % — AB (ref 36.0–46.0)
HEMOGLOBIN: 10.8 g/dL — AB (ref 12.0–15.0)
MCH: 27.5 pg (ref 26.0–34.0)
MCHC: 34.8 g/dL (ref 30.0–36.0)
MCV: 78.9 fL (ref 78.0–100.0)
Platelets: 222 10*3/uL (ref 150–400)
RBC: 3.93 MIL/uL (ref 3.87–5.11)
RDW: 14.9 % (ref 11.5–15.5)
WBC: 19.3 10*3/uL — AB (ref 4.0–10.5)

## 2016-06-06 LAB — COMPREHENSIVE METABOLIC PANEL
ALK PHOS: 86 U/L (ref 38–126)
ALT: 16 U/L (ref 14–54)
AST: 26 U/L (ref 15–41)
Albumin: 2.8 g/dL — ABNORMAL LOW (ref 3.5–5.0)
Anion gap: 6 (ref 5–15)
BILIRUBIN TOTAL: 0.4 mg/dL (ref 0.3–1.2)
BUN: 11 mg/dL (ref 6–20)
CO2: 23 mmol/L (ref 22–32)
CREATININE: 0.79 mg/dL (ref 0.44–1.00)
Calcium: 8.6 mg/dL — ABNORMAL LOW (ref 8.9–10.3)
Chloride: 105 mmol/L (ref 101–111)
GFR calc Af Amer: >60 mL/min (ref >60)
GFR calc non Af Amer: >60 mL/min (ref >60)
GLUCOSE: 76 mg/dL (ref 65–99)
Potassium: 3.9 mmol/L (ref 3.5–5.1)
Sodium: 134 mmol/L — ABNORMAL LOW (ref 135–145)
TOTAL PROTEIN: 5.3 g/dL — AB (ref 6.5–8.1)

## 2016-06-06 NOTE — Progress Notes (Signed)
Subjective: Postpartum Day 1: Cesarean Delivery Patient reports incisional pain, tolerating PO, + flatus and no problems voiding.    Objective: Vital signs in last 24 hours: Temp:  [97 F (36.1 C)-97.8 F (36.6 C)] 97 F (36.1 C) (02/05 0910) Pulse Rate:  [62-91] 91 (02/05 1618) Resp:  [18] 18 (02/05 0522) BP: (110-123)/(46-67) 123/67 (02/05 1618) SpO2:  [98 %] 98 % (02/04 2145)  Physical Exam:  General: alert and cooperative Lochia: appropriate Uterine Fundus: firm Incision: Bandage clean dry and intact  DVT Evaluation: No evidence of DVT seen on physical exam.   Recent Labs  06/05/16 0603 06/06/16 0553  HGB 11.8* 10.8*  HCT 34.4* 31.0*    Assessment/Plan: Status post Cesarean section. Doing well postoperatively.  Continue current care. Gestational hypertension. BP well controlled.  Circumcision of infant prior to discharge  Tytus Strahle J. 06/06/2016, 6:43 PM

## 2016-06-06 NOTE — Progress Notes (Signed)
BP was taken before giving Toprol and Procardia.  Bp was 116/59 and HR 62.  Medication held and MD called.  Order received to hold medications if Systolic BP is less than 120 and Diastolic is less than 80.

## 2016-06-06 NOTE — Lactation Note (Signed)
This note was copied from a baby's chart. Lactation Consultation Note  Mother was given #24NS to use on L side.  Mother states baby comes off and on but with NS he sustains latch. Attempted on L side without NS and he did latch but came off after a few minutes. Mother latched him independently on R side and sustained latch for 20 min.  Sucks and swallows observed. Did note short anterior lingual frenulum.  Suggest parents discuss w/ Pediatrician.  Mother states she feels pull and tug occasionally a bite. Set up DEPB and suggest if mother is using the NS on L side she should pump 3-4 times a day for 10-15 min. Reviewed spoon feeding.  Discussed plan with Paulino RilyAshley RN.   Patient Name: Erica Inda CokeChandra Schiro OZDGU'YToday's Date: 06/06/2016 Reason for consult: Follow-up assessment   Maternal Data    Feeding Feeding Type: Breast Fed Length of feed: 20 min  LATCH Score/Interventions Latch: Repeated attempts needed to sustain latch, nipple held in mouth throughout feeding, stimulation needed to elicit sucking reflex.  Audible Swallowing: A few with stimulation  Type of Nipple: Everted at rest and after stimulation  Comfort (Breast/Nipple): Soft / non-tender     Hold (Positioning): Assistance needed to correctly position infant at breast and maintain latch.  LATCH Score: 7  Lactation Tools Discussed/Used Pump Review: Setup, frequency, and cleaning;Milk Storage Initiated by:: Dahlia Byesuth Jasaiah Karwowski RN IBCLC Date initiated:: 06/06/16   Consult Status Consult Status: Complete    Hardie PulleyBerkelhammer, Keoki Mchargue Boschen 06/06/2016, 12:17 PM

## 2016-06-07 MED ORDER — IBUPROFEN 600 MG PO TABS: 600.0000 mg | ORAL_TABLET | Freq: Four times a day (QID) | ORAL | 1 refills | Status: DC | PRN

## 2016-06-07 MED ORDER — BISACODYL 10 MG RE SUPP
10.0000 mg | Freq: Once | RECTAL | Status: AC
  Administered 2016-06-07: 10 mg via RECTAL
  Filled 2016-06-07: qty 1

## 2016-06-07 MED ORDER — OXYCODONE-ACETAMINOPHEN 5-325 MG PO TABS: 1.0000 | ORAL_TABLET | ORAL | 0 refills | Status: DC | PRN

## 2016-06-07 NOTE — Lactation Note (Signed)
This note was copied from a baby's chart. Lactation Consultation Note  Patient Name: Boy Inda CokeChandra Richens AVWUJ'WToday's Date: 06/07/2016 Reason for consult: Follow-up assessment   Baby was latched in football hold sitting up without nipple shield. Sucks and swallows observed. Discussed pumping and going back to work. Mom encouraged to feed baby 8-12 times/24 hours and with feeding cues.  Reviewed engorgement care and monitoring voids/stools.    Maternal Data    Feeding Feeding Type: Breast Fed  LATCH Score/Interventions Latch: Grasps breast easily, tongue down, lips flanged, rhythmical sucking. (latched upon entering) Intervention(s): Adjust position;Assist with latch;Breast compression  Audible Swallowing: A few with stimulation Intervention(s): Skin to skin;Hand expression  Type of Nipple: Everted at rest and after stimulation Intervention(s): Double electric pump  Comfort (Breast/Nipple): Filling, red/small blisters or bruises, mild/mod discomfort  Problem noted: Mild/Moderate discomfort Interventions (Mild/moderate discomfort): Hand expression (coconut oil)  Hold (Positioning): No assistance needed to correctly position infant at breast. Intervention(s): Breastfeeding basics reviewed;Support Pillows;Skin to skin  LATCH Score: 8  Lactation Tools Discussed/Used     Consult Status Consult Status: Complete    Hardie PulleyBerkelhammer, Ruth Boschen 06/07/2016, 9:41 AM

## 2016-06-07 NOTE — Progress Notes (Signed)
Subjective: Postpartum Day 2: Cesarean Delivery Patient reports tolerating PO, + flatus and no problems voiding.  Patient desires medication for constipation . She desires early discharge home   Objective: Vital signs in last 24 hours: Temp:  [97 F (36.1 C)-98.4 F (36.9 C)] 98.4 F (36.9 C) (02/05 1840) Pulse Rate:  [62-95] 89 (02/06 0541) Resp:  [18] 18 (02/06 0541) BP: (116-140)/(59-70) 140/67 (02/06 0541)  Physical Exam:  General: alert and cooperative Lochia: appropriate Uterine Fundus: firm Incision: healing well DVT Evaluation: No evidence of DVT seen on physical exam.   Recent Labs  06/05/16 0603 06/06/16 0553  HGB 11.8* 10.8*  HCT 34.4* 31.0*    Assessment/Plan: Status post Cesarean section. Doing well postoperatively.  Dulcolax suppository for constipation  CHTN - BP normal to high normal. Procardia XL 30 mg daily . Plan to hold metoprolol  Plan discharge home today. .Follow up in the office in 1 wk for bp check    Luwana Butrick J. 06/07/2016, 8:05 AM

## 2016-06-07 NOTE — Discharge Summary (Signed)
OB Discharge Summary     Patient Name: Erica Ramos DOB: 07/23/1975 MRN: [id number]  Date of admission: 06/03/2016 Delivering MD: Hoover Browns   Date of discharge: 06/07/2016  Admitting diagnosis: 39w, Induction  Intrauterine pregnancy: [redacted]w[redacted]d     Secondary diagnosis:  Principal Problem:   Delivered by cesarean section Active Problems:   Hypertension affecting pregnancy in third trimester  Additional problems: Obesity      Discharge diagnosis: Term Pregnancy Delivered and CHTN                                                                                                Post partum procedures:None  Augmentation: AROM, Pitocin and Cytotec  Complications: None  Hospital course:  Induction of Labor With Cesarean Section  41 y.o. yo G2P1011 at [redacted]w[redacted]d was admitted to the hospital 06/03/2016 for induction of labor. Patient had a labor course significant for cytotec x3 followed by Placement of Foley Bulb, Pitocin. The patient went for cesarean section due to Non-Reassuring FHR, and delivered a Viable infant,@BABYSUPPRESS (DBLINK,ept,110,,1,,) Membrane Rupture Time/Date: )1:07 PM ,06/04/2016   @Details  of operation can be found in separate operative Note.  Patient had an uncomplicated postpartum course. She is ambulating, tolerating a regular diet, passing flatus, and urinating well.  Patient is discharged home in stable condition on 06/11/16.                                    Physical exam  Vitals:   06/06/16 0910 06/06/16 1618 06/06/16 1840 06/07/16 0541  BP: (!) 116/59 123/67 140/70 140/67  Pulse: 62 91 95 89  Resp:   18 18  Temp: 97 F (36.1 C)  98.4 F (36.9 C)   TempSrc: Axillary  Oral   SpO2:      Weight:      Height:       General: alert, cooperative and no distress Lochia: appropriate Uterine Fundus: firm Incision: Healing well with no significant drainage DVT Evaluation: No evidence of DVT seen on physical exam. Labs: Lab Results  Component Value Date   WBC 19.3 (H)  06/06/2016   HGB 10.8 (L) 06/06/2016   HCT 31.0 (L) 06/06/2016   MCV 78.9 06/06/2016   PLT 222 06/06/2016   CMP Latest Ref Rng & Units 06/06/2016  Glucose 65 - 99 mg/dL 76  BUN 6 - 20 mg/dL 11  Creatinine 4.40 - 3.47 mg/dL 4.25  Sodium 956 - 387 mmol/L 134(L)  Potassium 3.5 - 5.1 mmol/L 3.9  Chloride 101 - 111 mmol/L 105  CO2 22 - 32 mmol/L 23  Calcium 8.9 - 10.3 mg/dL 5.6(E)  Total Protein 6.5 - 8.1 g/dL 5.3(L)  Total Bilirubin 0.3 - 1.2 mg/dL 0.4  Alkaline Phos 38 - 126 U/L 86  AST 15 - 41 U/L 26  ALT 14 - 54 U/L 16    Discharge instruction: per After Visit Summary and "Baby and Me Booklet".  After visit meds:  Allergies as of 06/07/2016      Reactions   Neosporin [  neomycin-bacitracin Zn-polymyx] Anaphylaxis   Penicillins Rash, Other (See Comments)   Has patient had a PCN reaction causing immediate rash, facial/tongue/throat swelling, SOB or lightheadedness with hypotension: Yes Has patient had a PCN reaction causing severe rash involving mucus membranes or skin necrosis: Yes Has patient had a PCN reaction that required hospitalization Yes Has patient had a PCN reaction occurring within the last 10 years: No If all of the above answers are "NO", then may proceed with Cephalosporin use.      Medication List    STOP taking these medications   metoprolol succinate 100 MG 24 hr tablet Commonly known as:  TOPROL-XL   valACYclovir 500 MG tablet Commonly known as:  VALTREX     TAKE these medications   acetaminophen 500 MG tablet Commonly known as:  TYLENOL Take 500 mg by mouth daily as needed for headache.   cetirizine 10 MG tablet Commonly known as:  ZYRTEC Take 1 tablet (10 mg total) by mouth daily.   Garlic 10 MG Caps Take 10 mg by mouth daily.   ibuprofen 600 MG tablet Commonly known as:  ADVIL,MOTRIN Take 1 tablet (600 mg total) by mouth every 6 (six) hours as needed for mild pain.   multivitamin-prenatal 27-0.8 MG Tabs tablet Take 1 tablet by mouth daily at  12 noon.   NIFEdipine 30 MG 24 hr tablet Commonly known as:  PROCARDIA-XL/ADALAT-CC/NIFEDICAL-XL Take 1 tablet by mouth daily.   omega-3 fish oil 1000 MG Caps capsule Commonly known as:  MAXEPA Take 1 capsule by mouth daily.   omeprazole 20 MG capsule Commonly known as:  PRILOSEC Take 1 capsule (20 mg total) by mouth daily.   oxyCODONE-acetaminophen 5-325 MG tablet Commonly known as:  PERCOCET/ROXICET Take 1-2 tablets by mouth every 4 (four) hours as needed (pain scale 4-7).   PRESCRIPTION MEDICATION Place 1 drop into both eyes daily as needed (for severe allergies). Prescription eye drop, cannot verify, prescription not on file at pharmacy   triamcinolone 55 MCG/ACT Aero nasal inhaler Commonly known as:  NASACORT AQ Place 2 sprays into the nose daily.       Diet: low salt diet  Activity: Advance as tolerated. Pelvic rest for 6 weeks.   Outpatient follow up:2 weeks Follow up Appt:No future appointments. Follow up Visit:No Follow-up on file.  Postpartum contraception: Not Discussed  Newborn Data: Live born female  Birth Weight: 9 lb 1.3 oz (4119 g) APGAR: 9, 9  Baby Feeding: Breast Disposition:home with mother   06/07/2016 Jessee Avers., MD

## 2016-06-16 NOTE — Telephone Encounter (Signed)
Patient states she is still being treated by her OB/GYN following delivery. States she needs to see provider after her 4 week check up. Scheduled for 07/18/2016

## 2016-06-23 ENCOUNTER — Telehealth (HOSPITAL_COMMUNITY): Payer: Self-pay | Admitting: Lactation Services

## 2016-06-23 NOTE — Telephone Encounter (Signed)
Mom called, left message and I called her back. She reports baby is now 762 1/2 Erica Ramos old, not gaining weight well and Ped recommended supplementing with formula. Has Medela DEBP for 1 week and and has pumped twice with it. Encouraged to pump at least 6 times/day for 15-20 min. Has started taking mother milk tea but only drinking one cup every day or two Encouraged to follow package label. To nurse baby, post pump and supplement with EBM or formula. Asking about milk storage- reviewed guidelines with her. No further questions To call prn

## 2016-07-01 ENCOUNTER — Ambulatory Visit (INDEPENDENT_AMBULATORY_CARE_PROVIDER_SITE_OTHER): Payer: 59 | Admitting: Family Medicine

## 2016-07-01 ENCOUNTER — Encounter: Payer: Self-pay | Admitting: Family Medicine

## 2016-07-01 VITALS — BP 138/70 | HR 86 | Temp 98.5°F | Resp 16 | Ht 62.0 in | Wt 247.2 lb

## 2016-07-01 DIAGNOSIS — R609 Edema, unspecified: Secondary | ICD-10-CM | POA: Diagnosis not present

## 2016-07-01 DIAGNOSIS — I10 Essential (primary) hypertension: Secondary | ICD-10-CM | POA: Diagnosis not present

## 2016-07-01 LAB — LIPID PANEL
Cholesterol: 146 mg/dL (ref <200)
HDL: 65 mg/dL (ref >50)
LDL Cholesterol: 71 mg/dL (ref <100)
TRIGLYCERIDES: 49 mg/dL (ref <150)
Total CHOL/HDL Ratio: 2.2 Ratio (ref <5.0)
VLDL: 10 mg/dL (ref <30)

## 2016-07-01 LAB — COMPREHENSIVE METABOLIC PANEL
ALBUMIN: 4 g/dL (ref 3.6–5.1)
ALK PHOS: 83 U/L (ref 33–115)
ALT: 12 U/L (ref 6–29)
AST: 14 U/L (ref 10–30)
BILIRUBIN TOTAL: 0.4 mg/dL (ref 0.2–1.2)
BUN: 9 mg/dL (ref 7–25)
CALCIUM: 9.2 mg/dL (ref 8.6–10.2)
CO2: 28 mmol/L (ref 20–31)
Chloride: 103 mmol/L (ref 98–110)
Creat: 0.76 mg/dL (ref 0.50–1.10)
Glucose, Bld: 73 mg/dL (ref 65–99)
POTASSIUM: 3.6 mmol/L (ref 3.5–5.3)
Sodium: 140 mmol/L (ref 135–146)
Total Protein: 6.4 g/dL (ref 6.1–8.1)

## 2016-07-01 MED ORDER — NIFEDIPINE ER OSMOTIC RELEASE 30 MG PO TB24: 30.0000 mg | ORAL_TABLET | Freq: Every day | ORAL | 1 refills | Status: DC

## 2016-07-01 MED ORDER — HYDROCHLOROTHIAZIDE 25 MG PO TABS: 25.0000 mg | ORAL_TABLET | Freq: Every day | ORAL | 3 refills | Status: DC

## 2016-07-01 NOTE — Progress Notes (Signed)
Patient ID: Erica Ramos, female    DOB: 11/11/1975  Age: 41 y.o. MRN: [id number]    Subjective:  Subjective  HPI Erica Ramos presents for f/u bp -- she delivered 1 month ago.   Review of Systems  Constitutional: Negative for appetite change, diaphoresis, fatigue and unexpected weight change.  Eyes: Negative for pain, redness and visual disturbance.  Respiratory: Negative for cough, chest tightness, shortness of breath and wheezing.   Cardiovascular: Negative for chest pain, palpitations and leg swelling.  Endocrine: Negative for cold intolerance, heat intolerance, polydipsia, polyphagia and polyuria.  Genitourinary: Negative for difficulty urinating, dysuria and frequency.  Neurological: Negative for dizziness, light-headedness, numbness and headaches.    History Past Medical History:  Diagnosis Date  . Anxiety   . Bronchitis    chronic - has flare every December  . Cervical polyp    hx of  . Environmental allergies   . Genital warts   . GERD (gastroesophageal reflux disease)    Per MD chart note 12/01/2010  . Hx of abnormal Pap smear 2012  . Hypertension   . Morbid obesity (HCC)    BMI 47.9  . Persistent headaches    worst during menstrual cycle  . PVC (premature ventricular contraction) 02/20/2015  . STD (sexually transmitted disease)    hx genital warts/?HSV  . Vaginal Pap smear, abnormal     She has a past surgical history that includes Wisdom tooth extraction; Colposcopy (2012); and Cesarean section (N/A, 06/05/2016).   Her family history includes Alcohol abuse in her father; Diabetes in her maternal aunt, maternal grandmother, maternal uncle, and mother; Hypertension in her maternal grandmother, mother, other, and sister; Obesity in her other; Seizures in her maternal aunt.She reports that she has never smoked. She has never used smokeless tobacco. She reports that she drinks about 0.6 oz of alcohol per week . She reports that she does not use drugs.  Current  Outpatient Prescriptions on File Prior to Visit  Medication Sig Dispense Refill  . acetaminophen (TYLENOL) 500 MG tablet Take 500 mg by mouth daily as needed for headache.    . cetirizine (ZYRTEC) 10 MG tablet Take 1 tablet (10 mg total) by mouth daily. 90 tablet 3  . Garlic 10 MG CAPS Take 10 mg by mouth daily.     Marland Kitchen. ibuprofen (ADVIL,MOTRIN) 600 MG tablet Take 1 tablet (600 mg total) by mouth every 6 (six) hours as needed for mild pain. 30 tablet 1  . omega-3 fish oil (MAXEPA) 1000 MG CAPS capsule Take 1 capsule by mouth daily.     Marland Kitchen. omeprazole (PRILOSEC) 20 MG capsule Take 1 capsule (20 mg total) by mouth daily. 30 capsule 5  . oxyCODONE-acetaminophen (PERCOCET/ROXICET) 5-325 MG tablet Take 1-2 tablets by mouth every 4 (four) hours as needed (pain scale 4-7). 30 tablet 0  . Prenatal Vit-Fe Fumarate-FA (MULTIVITAMIN-PRENATAL) 27-0.8 MG TABS tablet Take 1 tablet by mouth daily at 12 noon.    Marland Kitchen. PRESCRIPTION MEDICATION Place 1 drop into both eyes daily as needed (for severe allergies). Prescription eye drop, cannot verify, prescription not on file at pharmacy    . triamcinolone (NASACORT AQ) 55 MCG/ACT AERO nasal inhaler Place 2 sprays into the nose daily. 1 Inhaler 12   No current facility-administered medications on file prior to visit.      Objective:  Objective  Physical Exam  Constitutional: She is oriented to person, place, and time. She appears well-developed and well-nourished.  HENT:  Head: Normocephalic and atraumatic.  Eyes: Conjunctivae and EOM are normal.  Neck: Normal range of motion. Neck supple. No JVD present. Carotid bruit is not present. No thyromegaly present.  Cardiovascular: Normal rate, regular rhythm and normal heart sounds.   No murmur heard. Pulmonary/Chest: Effort normal and breath sounds normal. No respiratory distress. She has no wheezes. She has no rales. She exhibits no tenderness.  Musculoskeletal: She exhibits no edema.  Neurological: She is alert and  oriented to person, place, and time.  Psychiatric: She has a normal mood and affect. Her behavior is normal. Judgment and thought content normal.  Nursing note and vitals reviewed.  BP 138/70 (BP Location: Left Arm, Patient Position: Sitting, Cuff Size: Large)   Pulse 86   Temp 98.5 F (36.9 C) (Oral)   Resp 16   Ht 5\' 2"  (1.575 m)   Wt 247 lb 3.2 oz (112.1 kg)   SpO2 97%   BMI 45.21 kg/m  Wt Readings from Last 3 Encounters:  07/01/16 247 lb 3.2 oz (112.1 kg)  06/03/16 273 lb (123.8 kg)  04/29/16 267 lb 3.2 oz (121.2 kg)     Lab Results  Component Value Date   WBC 19.3 (H) 06/06/2016   HGB 10.8 (L) 06/06/2016   HCT 31.0 (L) 06/06/2016   PLT 222 06/06/2016   GLUCOSE 76 06/06/2016   CHOL 144 10/30/2014   TRIG 52.0 10/30/2014   HDL 44.50 10/30/2014   LDLCALC 89 10/30/2014   ALT 16 06/06/2016   AST 26 06/06/2016   NA 134 (L) 06/06/2016   K 3.9 06/06/2016   CL 105 06/06/2016   CREATININE 0.79 06/06/2016   BUN 11 06/06/2016   CO2 23 06/06/2016   TSH 2.342 02/06/2015   HGBA1C 5.7 10/30/2014   MICROALBUR <0.7 10/30/2014    No results found.   Assessment & Plan:  Plan  I have changed [name]'s NIFEdipine. I am also having her start on hydrochlorothiazide. Additionally, I am having her maintain her cetirizine, omeprazole, multivitamin-prenatal, Garlic, omega-3 fish oil, triamcinolone, acetaminophen, PRESCRIPTION MEDICATION, ibuprofen, and oxyCODONE-acetaminophen.  Meds ordered this encounter  Medications  . hydrochlorothiazide (HYDRODIURIL) 25 MG tablet    Sig: Take 1 tablet (25 mg total) by mouth daily.    Dispense:  90 tablet    Refill:  3  . NIFEdipine (PROCARDIA-XL/ADALAT-CC/NIFEDICAL-XL) 30 MG 24 hr tablet    Sig: Take 1 tablet (30 mg total) by mouth daily.    Dispense:  90 tablet    Refill:  1    Problem List Items Addressed This Visit    None    Visit Diagnoses    Edema, unspecified type    -  Primary   Relevant Medications   hydrochlorothiazide  (HYDRODIURIL) 25 MG tablet   Essential hypertension       Relevant Medications   hydrochlorothiazide (HYDRODIURIL) 25 MG tablet   NIFEdipine (PROCARDIA-XL/ADALAT-CC/NIFEDICAL-XL) 30 MG 24 hr tablet   Other Relevant Orders   Lipid panel   Comprehensive metabolic panel      Follow-up: Return in about 3 months (around 10/01/2016) for hypertension.  Donato Schultz, DO

## 2016-07-01 NOTE — Patient Instructions (Signed)

## 2016-07-01 NOTE — Progress Notes (Signed)
Pre visit review using our clinic review tool, if applicable. No additional management support is needed unless otherwise documented below in the visit note. 

## 2016-07-18 ENCOUNTER — Ambulatory Visit: Payer: 59 | Admitting: Family Medicine

## 2017-02-16 LAB — OB RESULTS CONSOLE ABO/RH: RH Type: POSITIVE

## 2017-02-16 LAB — OB RESULTS CONSOLE HEPATITIS B SURFACE ANTIGEN: Hepatitis B Surface Ag: NEGATIVE

## 2017-02-16 LAB — OB RESULTS CONSOLE GC/CHLAMYDIA
CHLAMYDIA, DNA PROBE: NEGATIVE
GC PROBE AMP, GENITAL: NEGATIVE

## 2017-02-16 LAB — OB RESULTS CONSOLE HIV ANTIBODY (ROUTINE TESTING): HIV: NONREACTIVE

## 2017-02-16 LAB — OB RESULTS CONSOLE ANTIBODY SCREEN: ANTIBODY SCREEN: NEGATIVE

## 2017-02-16 LAB — OB RESULTS CONSOLE RPR: RPR: NONREACTIVE

## 2017-02-16 LAB — OB RESULTS CONSOLE RUBELLA ANTIBODY, IGM: RUBELLA: IMMUNE

## 2017-03-13 ENCOUNTER — Other Ambulatory Visit (HOSPITAL_COMMUNITY)
Admission: RE | Admit: 2017-03-13 | Discharge: 2017-03-13 | Disposition: A | Discharge status: Home / Self Care | Payer: 59 | Source: Ambulatory Visit | Attending: Obstetrics and Gynecology | Admitting: Obstetrics and Gynecology

## 2017-03-13 ENCOUNTER — Other Ambulatory Visit: Payer: Self-pay | Admitting: Obstetrics and Gynecology

## 2017-03-13 DIAGNOSIS — Z124 Encounter for screening for malignant neoplasm of cervix: Secondary | ICD-10-CM | POA: Insufficient documentation

## 2017-03-14 LAB — CYTOLOGY - PAP
CHLAMYDIA, DNA PROBE: NEGATIVE
DIAGNOSIS: NEGATIVE
NEISSERIA GONORRHEA: NEGATIVE

## 2017-04-19 ENCOUNTER — Ambulatory Visit: Payer: 59 | Admitting: Family Medicine

## 2017-04-21 ENCOUNTER — Ambulatory Visit: Payer: 59 | Admitting: Family Medicine

## 2017-04-24 ENCOUNTER — Ambulatory Visit (INDEPENDENT_AMBULATORY_CARE_PROVIDER_SITE_OTHER): Payer: 59 | Admitting: Family Medicine

## 2017-04-24 ENCOUNTER — Encounter: Payer: Self-pay | Admitting: Family Medicine

## 2017-04-24 ENCOUNTER — Ambulatory Visit: Payer: 59 | Admitting: Family Medicine

## 2017-04-24 VITALS — BP 132/84 | HR 78 | Temp 98.4°F | Ht 62.0 in | Wt 257.0 lb

## 2017-04-24 DIAGNOSIS — J0101 Acute recurrent maxillary sinusitis: Secondary | ICD-10-CM | POA: Diagnosis not present

## 2017-04-24 MED ORDER — AZITHROMYCIN 250 MG PO TABS: ORAL_TABLET | ORAL | 0 refills | Status: DC

## 2017-04-24 NOTE — Progress Notes (Signed)
Chief Complaint  Patient presents with  . Nasal Congestion    cough    Erica EhrichChandra D Broberg here for URI complaints.  Duration: 3 weeks  Associated symptoms: sinus congestion, sinus pain, rhinorrhea and cough Denies: itchy watery eyes, ear pain, ear drainage, sore throat, shortness of breath and fevers/rigors Treatment to date: Cold and Flu Sick contacts: Yes- son's dad  ROS:  Const: Denies fevers HEENT: As noted in HPI Lungs: No SOB  Past Medical History:  Diagnosis Date  . Anxiety   . Bronchitis    chronic - has flare every December  . Cervical polyp    hx of  . Environmental allergies   . Genital warts   . GERD (gastroesophageal reflux disease)    Per MD chart note 12/01/2010  . Hx of abnormal Pap smear 2012  . Hypertension   . Morbid obesity (HCC)    BMI 47.9  . Persistent headaches    worst during menstrual cycle  . PVC (premature ventricular contraction) 02/20/2015  . STD (sexually transmitted disease)    hx genital warts/?HSV  . Vaginal Pap smear, abnormal    BP 132/84 (BP Location: Left Arm, Patient Position: Sitting, Cuff Size: Large)   Pulse 78   Temp 98.4 F (36.9 C) (Oral)   Ht 5\' 2"  (1.575 m)   Wt 257 lb (116.6 kg)   SpO2 98%   BMI 47.01 kg/m  General: Awake, alert, appears stated age HEENT: AT, [place], ears patent b/l and TM's neg, nares patent w/o discharge, sinuses mildly ttp over maxilla, turbinates enlarged, pharynx pink and without exudates, MMM Neck: No masses or asymmetry Heart: RRR Lungs: CTAB, no accessory muscle use Psych: Age appropriate judgment and insight, normal mood and affect  Acute recurrent maxillary sinusitis  PCN allergy, pt is pregnant. Wait 2-3 days prior to taking abx, don't take if she is getting better. Continue to push fluids, practice good hand hygiene, cover mouth when coughing. F/u prn. If starting to experience fevers, shaking, or shortness of breath, seek immediate care. Pt voiced understanding and agreement to the  plan.  Jilda Rocheicholas Paul Maria SteinWendling, DO 04/24/17 8:33 AM

## 2017-04-24 NOTE — Progress Notes (Signed)
Pre visit review using our clinic review tool, if applicable. No additional management support is needed unless otherwise documented below in the visit note. 

## 2017-04-24 NOTE — Patient Instructions (Addendum)
Continue to push fluids, practice good hand hygiene, and cover your mouth if you cough.  If you start having fevers, shaking or shortness of breath, seek immediate care.  Tylenol OK for misery.   Start using your nasal spray daily again.  Wait 2 days and if no better, take antibiotic.  Let us know if you need anything.

## 2017-06-17 DIAGNOSIS — I10 Essential (primary) hypertension: Secondary | ICD-10-CM | POA: Insufficient documentation

## 2017-09-05 ENCOUNTER — Ambulatory Visit (HOSPITAL_COMMUNITY)
Admission: RE | Admit: 2017-09-05 | Discharge: 2017-09-05 | Disposition: A | Discharge status: Home / Self Care | Payer: Self-pay | Source: Ambulatory Visit | Attending: Obstetrics and Gynecology | Admitting: Obstetrics and Gynecology

## 2017-09-05 ENCOUNTER — Other Ambulatory Visit (HOSPITAL_COMMUNITY): Payer: Self-pay | Admitting: Obstetrics and Gynecology

## 2017-09-05 DIAGNOSIS — O10913 Unspecified pre-existing hypertension complicating pregnancy, third trimester: Secondary | ICD-10-CM

## 2017-09-05 DIAGNOSIS — Z3A34 34 weeks gestation of pregnancy: Secondary | ICD-10-CM

## 2017-09-07 ENCOUNTER — Encounter (HOSPITAL_COMMUNITY): Payer: Self-pay | Admitting: *Deleted

## 2017-09-18 NOTE — Patient Instructions (Signed)
NAYAB ATEN  09/18/2017   Your procedure is scheduled on:  09/20/2017  Enter through the Main Entrance of [facility] at 0530 AM.  Pick up the phone at the desk and dial [phone]  Call this number if you have problems the morning of surgery:[phone]  Remember:   Do not eat food:(After Midnight) Desps de medianoche.  Do not drink clear liquids: (After Midnight) Desps de medianoche.  Take these medicines the morning of surgery with A SIP OF WATER: take procardia as prescribed   Do not wear jewelry, make-up or nail polish.  Do not wear lotions, powders, or perfumes. Do not wear deodorant.  Do not shave 48 hours prior to surgery.  Do not bring valuables to the hospital.  [facility] is not   responsible for any belongings or valuables brought to the hospital.  Contacts, dentures or bridgework may not be worn into surgery.  Leave suitcase in the car. After surgery it may be brought to your room.  For patients admitted to the hospital, checkout time is 11:00 AM the day of              discharge.    N/A   Please read over the following fact sheets that you were given:   Surgical Site Infection Prevention

## 2017-09-19 ENCOUNTER — Other Ambulatory Visit: Payer: Self-pay | Admitting: Obstetrics and Gynecology

## 2017-09-19 ENCOUNTER — Encounter (HOSPITAL_COMMUNITY)
Admission: RE | Admit: 2017-09-19 | Discharge: 2017-09-19 | Disposition: A | Discharge status: Home / Self Care | Payer: Self-pay | Source: Ambulatory Visit | Attending: Obstetrics and Gynecology | Admitting: Obstetrics and Gynecology

## 2017-09-19 HISTORY — DX: Cardiac murmur, unspecified: R01.1

## 2017-09-19 LAB — CBC
HCT: 35.8 % — ABNORMAL LOW (ref 36.0–46.0)
Hemoglobin: 12.2 g/dL (ref 12.0–15.0)
MCH: 26.9 pg (ref 26.0–34.0)
MCHC: 34.1 g/dL (ref 30.0–36.0)
MCV: 78.9 fL (ref 78.0–100.0)
PLATELETS: 248 10*3/uL (ref 150–400)
RBC: 4.54 MIL/uL (ref 3.87–5.11)
RDW: 15 % (ref 11.5–15.5)
WBC: 10.1 10*3/uL (ref 4.0–10.5)

## 2017-09-19 LAB — TYPE AND SCREEN
ABO/RH(D): A POS
Antibody Screen: NEGATIVE

## 2017-09-19 MED ORDER — DEXTROSE 5 % IV SOLN
900.0000 mg | Freq: Once | INTRAVENOUS | Status: AC
  Administered 2017-09-20: 900 mg via INTRAVENOUS

## 2017-09-19 NOTE — Anesthesia Preprocedure Evaluation (Addendum)
Anesthesia Evaluation  Patient identified by MRN, date of birth, ID band Patient awake    Reviewed: Allergy & Precautions, NPO status , Patient's Chart, lab work & pertinent test results  Airway Mallampati: II  TM Distance: >3 FB Neck ROM: Full    Dental   Pulmonary neg pulmonary ROS,    breath sounds clear to auscultation       Cardiovascular hypertension, Pt. on medications  Rhythm:Regular Rate:Normal     Neuro/Psych negative neurological ROS     GI/Hepatic Neg liver ROS, GERD  ,  Endo/Other  negative endocrine ROS  Renal/GU negative Renal ROS     Musculoskeletal   Abdominal   Peds  Hematology negative hematology ROS (+)   Anesthesia Other Findings   Reproductive/Obstetrics (+) Pregnancy                            Lab Results  Component Value Date   WBC 10.1 09/19/2017   HGB 12.2 09/19/2017   HCT 35.8 (L) 09/19/2017   MCV 78.9 09/19/2017   PLT 248 09/19/2017    Anesthesia Physical Anesthesia Plan  ASA: III  Anesthesia Plan: Spinal and Combined Spinal and Epidural   Post-op Pain Management:    Induction:   PONV Risk Score and Plan: 3 and Ondansetron, Dexamethasone, Scopolamine patch - Pre-op and Treatment may vary due to age or medical condition  Airway Management Planned: Natural Airway  Additional Equipment:   Intra-op Plan:   Post-operative Plan:   Informed Consent: I have reviewed the patients History and Physical, chart, labs and discussed the procedure including the risks, benefits and alternatives for the proposed anesthesia with the patient or authorized representative who has indicated his/her understanding and acceptance.     Plan Discussed with: CRNA  Anesthesia Plan Comments:        Anesthesia Quick Evaluation

## 2017-09-20 ENCOUNTER — Inpatient Hospital Stay (HOSPITAL_COMMUNITY)
Admission: AD | Admit: 2017-09-20 | Discharge: 2017-09-23 | DRG: 787 | Disposition: A | Discharge status: Home / Self Care | Payer: Self-pay | Source: Ambulatory Visit | Attending: Obstetrics and Gynecology | Admitting: Obstetrics and Gynecology

## 2017-09-20 ENCOUNTER — Inpatient Hospital Stay (HOSPITAL_COMMUNITY): Payer: Self-pay | Admitting: Anesthesiology

## 2017-09-20 ENCOUNTER — Encounter (HOSPITAL_COMMUNITY)
Admission: AD | Disposition: A | Discharge status: Home / Self Care | Payer: Self-pay | Source: Ambulatory Visit | Attending: Obstetrics and Gynecology

## 2017-09-20 ENCOUNTER — Encounter (HOSPITAL_COMMUNITY): Payer: Self-pay

## 2017-09-20 ENCOUNTER — Other Ambulatory Visit: Payer: Self-pay

## 2017-09-20 DIAGNOSIS — O1002 Pre-existing essential hypertension complicating childbirth: Secondary | ICD-10-CM | POA: Diagnosis present

## 2017-09-20 DIAGNOSIS — O9962 Diseases of the digestive system complicating childbirth: Secondary | ICD-10-CM | POA: Diagnosis present

## 2017-09-20 DIAGNOSIS — O99214 Obesity complicating childbirth: Secondary | ICD-10-CM | POA: Diagnosis present

## 2017-09-20 DIAGNOSIS — K219 Gastro-esophageal reflux disease without esophagitis: Secondary | ICD-10-CM | POA: Diagnosis present

## 2017-09-20 DIAGNOSIS — Z3A38 38 weeks gestation of pregnancy: Secondary | ICD-10-CM

## 2017-09-20 DIAGNOSIS — I10 Essential (primary) hypertension: Secondary | ICD-10-CM | POA: Diagnosis present

## 2017-09-20 DIAGNOSIS — Z880 Allergy status to penicillin: Secondary | ICD-10-CM

## 2017-09-20 DIAGNOSIS — O34211 Maternal care for low transverse scar from previous cesarean delivery: Principal | ICD-10-CM | POA: Diagnosis present

## 2017-09-20 DIAGNOSIS — O163 Unspecified maternal hypertension, third trimester: Secondary | ICD-10-CM | POA: Diagnosis present

## 2017-09-20 DIAGNOSIS — Z98891 History of uterine scar from previous surgery: Secondary | ICD-10-CM

## 2017-09-20 LAB — COMPREHENSIVE METABOLIC PANEL
ALBUMIN: 3 g/dL — AB (ref 3.5–5.0)
ALT: 15 U/L (ref 14–54)
ANION GAP: 11 (ref 5–15)
AST: 19 U/L (ref 15–41)
Alkaline Phosphatase: 106 U/L (ref 38–126)
BUN: 6 mg/dL (ref 6–20)
CHLORIDE: 108 mmol/L (ref 101–111)
CO2: 18 mmol/L — ABNORMAL LOW (ref 22–32)
CREATININE: 0.59 mg/dL (ref 0.44–1.00)
Calcium: 8.8 mg/dL — ABNORMAL LOW (ref 8.9–10.3)
GFR calc Af Amer: >60 mL/min (ref >60)
GFR calc non Af Amer: >60 mL/min (ref >60)
Glucose, Bld: 92 mg/dL (ref 65–99)
POTASSIUM: 4 mmol/L (ref 3.5–5.1)
Sodium: 137 mmol/L (ref 135–145)
TOTAL PROTEIN: 6 g/dL — AB (ref 6.5–8.1)
Total Bilirubin: 0.4 mg/dL (ref 0.3–1.2)

## 2017-09-20 LAB — PROTEIN / CREATININE RATIO, URINE
CREATININE, URINE: 23 mg/dL
Total Protein, Urine: <6 mg/dL

## 2017-09-20 LAB — CBC
HEMATOCRIT: 39.4 % (ref 36.0–46.0)
HEMOGLOBIN: 13.2 g/dL (ref 12.0–15.0)
MCH: 27 pg (ref 26.0–34.0)
MCHC: 33.5 g/dL (ref 30.0–36.0)
MCV: 80.6 fL (ref 78.0–100.0)
PLATELETS: 279 10*3/uL (ref 150–400)
RBC: 4.89 MIL/uL (ref 3.87–5.11)
RDW: 15.2 % (ref 11.5–15.5)
WBC: 13.5 10*3/uL — AB (ref 4.0–10.5)

## 2017-09-20 LAB — RPR: RPR: NONREACTIVE

## 2017-09-20 SURGERY — Surgical Case
Anesthesia: Spinal

## 2017-09-20 MED ORDER — ONDANSETRON HCL 4 MG/2ML IJ SOLN: 4.0000 mg | Freq: Three times a day (TID) | INTRAMUSCULAR | Status: DC | PRN

## 2017-09-20 MED ORDER — PANTOPRAZOLE SODIUM 40 MG PO TBEC
40.0000 mg | DELAYED_RELEASE_TABLET | Freq: Every day | ORAL | Status: DC
  Administered 2017-09-21 – 2017-09-23 (×3): 40 mg via ORAL
  Filled 2017-09-20 (×3): qty 1

## 2017-09-20 MED ORDER — LACTATED RINGERS IV SOLN
INTRAVENOUS | Status: DC
Start: 2017-09-20 — End: 2017-09-23
  Administered 2017-09-20 (×2): via INTRAVENOUS

## 2017-09-20 MED ORDER — DIPHENHYDRAMINE HCL 25 MG PO CAPS
25.0000 mg | ORAL_CAPSULE | ORAL | Status: DC | PRN
  Filled 2017-09-20: qty 1

## 2017-09-20 MED ORDER — HYDROMORPHONE HCL 1 MG/ML IJ SOLN
INTRAMUSCULAR | Status: AC
  Filled 2017-09-20: qty 1

## 2017-09-20 MED ORDER — KETOROLAC TROMETHAMINE 30 MG/ML IJ SOLN
30.0000 mg | Freq: Four times a day (QID) | INTRAMUSCULAR | Status: AC | PRN
  Administered 2017-09-20: 30 mg via INTRAMUSCULAR

## 2017-09-20 MED ORDER — ZOLPIDEM TARTRATE 5 MG PO TABS: 5.0000 mg | ORAL_TABLET | Freq: Every evening | ORAL | Status: DC | PRN

## 2017-09-20 MED ORDER — SOD CITRATE-CITRIC ACID 500-334 MG/5ML PO SOLN: 30.0000 mL | ORAL | Status: DC

## 2017-09-20 MED ORDER — OXYCODONE-ACETAMINOPHEN 5-325 MG PO TABS: 1.0000 | ORAL_TABLET | ORAL | Status: DC | PRN

## 2017-09-20 MED ORDER — IBUPROFEN 600 MG PO TABS
600.0000 mg | ORAL_TABLET | Freq: Four times a day (QID) | ORAL | Status: DC
  Administered 2017-09-20 – 2017-09-23 (×11): 600 mg via ORAL
  Filled 2017-09-20 (×11): qty 1

## 2017-09-20 MED ORDER — BUPIVACAINE IN DEXTROSE 0.75-8.25 % IT SOLN
INTRATHECAL | Status: DC | PRN
  Administered 2017-09-20: 1.4 mL via INTRATHECAL

## 2017-09-20 MED ORDER — NALOXONE HCL 0.4 MG/ML IJ SOLN: 0.4000 mg | INTRAMUSCULAR | Status: DC | PRN

## 2017-09-20 MED ORDER — DIPHENHYDRAMINE HCL 25 MG PO CAPS: 25.0000 mg | ORAL_CAPSULE | Freq: Four times a day (QID) | ORAL | Status: DC | PRN

## 2017-09-20 MED ORDER — DIBUCAINE 1 % RE OINT
1.0000 "application " | TOPICAL_OINTMENT | RECTAL | Status: DC | PRN
  Administered 2017-09-22: 1 via RECTAL
  Filled 2017-09-20: qty 28

## 2017-09-20 MED ORDER — OXYTOCIN 10 UNIT/ML IJ SOLN
INTRAMUSCULAR | Status: AC
  Filled 2017-09-20: qty 4

## 2017-09-20 MED ORDER — SCOPOLAMINE 1 MG/3DAYS TD PT72
1.0000 | MEDICATED_PATCH | TRANSDERMAL | Status: DC
  Filled 2017-09-20: qty 1

## 2017-09-20 MED ORDER — OXYTOCIN 40 UNITS IN LACTATED RINGERS INFUSION - SIMPLE MED: 2.5000 [IU]/h | INTRAVENOUS | Status: AC

## 2017-09-20 MED ORDER — NIFEDIPINE ER OSMOTIC RELEASE 30 MG PO TB24
30.0000 mg | ORAL_TABLET | Freq: Every day | ORAL | Status: DC
  Administered 2017-09-21 – 2017-09-23 (×3): 30 mg via ORAL
  Filled 2017-09-20 (×3): qty 1

## 2017-09-20 MED ORDER — DIPHENHYDRAMINE HCL 50 MG/ML IJ SOLN
12.5000 mg | INTRAMUSCULAR | Status: DC | PRN
  Administered 2017-09-20: 12.5 mg via INTRAVENOUS
  Filled 2017-09-20: qty 1

## 2017-09-20 MED ORDER — PHENYLEPHRINE 40 MCG/ML (10ML) SYRINGE FOR IV PUSH (FOR BLOOD PRESSURE SUPPORT)
PREFILLED_SYRINGE | INTRAVENOUS | Status: AC
  Filled 2017-09-20: qty 10

## 2017-09-20 MED ORDER — EPHEDRINE 5 MG/ML INJ
INTRAVENOUS | Status: AC
Start: 2017-09-20 — End: ?
  Filled 2017-09-20: qty 10

## 2017-09-20 MED ORDER — ENOXAPARIN SODIUM 60 MG/0.6ML ~~LOC~~ SOLN
60.0000 mg | SUBCUTANEOUS | Status: DC
  Administered 2017-09-21 – 2017-09-23 (×3): 60 mg via SUBCUTANEOUS
  Filled 2017-09-20 (×4): qty 0.6

## 2017-09-20 MED ORDER — FERROUS SULFATE 325 (65 FE) MG PO TABS
325.0000 mg | ORAL_TABLET | Freq: Two times a day (BID) | ORAL | Status: DC
  Administered 2017-09-20 – 2017-09-23 (×6): 325 mg via ORAL
  Filled 2017-09-20 (×6): qty 1

## 2017-09-20 MED ORDER — KETOROLAC TROMETHAMINE 30 MG/ML IJ SOLN
INTRAMUSCULAR | Status: AC
  Filled 2017-09-20: qty 1

## 2017-09-20 MED ORDER — MORPHINE SULFATE (PF) 0.5 MG/ML IJ SOLN
INTRAMUSCULAR | Status: AC
  Filled 2017-09-20: qty 10

## 2017-09-20 MED ORDER — PRENATAL MULTIVITAMIN CH
1.0000 | ORAL_TABLET | Freq: Every day | ORAL | Status: DC
  Administered 2017-09-21 – 2017-09-22 (×2): 1 via ORAL
  Filled 2017-09-20 (×2): qty 1

## 2017-09-20 MED ORDER — FENTANYL CITRATE (PF) 100 MCG/2ML IJ SOLN: 25.0000 ug | INTRAMUSCULAR | Status: DC | PRN

## 2017-09-20 MED ORDER — MEPERIDINE HCL 25 MG/ML IJ SOLN
6.2500 mg | INTRAMUSCULAR | Status: DC | PRN
Start: 2017-09-20 — End: 2017-09-20

## 2017-09-20 MED ORDER — MORPHINE SULFATE (PF) 0.5 MG/ML IJ SOLN
INTRAMUSCULAR | Status: DC | PRN
  Administered 2017-09-20: .2 mg via EPIDURAL

## 2017-09-20 MED ORDER — ONDANSETRON HCL 4 MG/2ML IJ SOLN
INTRAMUSCULAR | Status: AC
  Filled 2017-09-20: qty 2

## 2017-09-20 MED ORDER — WITCH HAZEL-GLYCERIN EX PADS: 1.0000 "application " | MEDICATED_PAD | CUTANEOUS | Status: DC | PRN

## 2017-09-20 MED ORDER — SODIUM CHLORIDE 0.9% FLUSH: 3.0000 mL | INTRAVENOUS | Status: DC | PRN

## 2017-09-20 MED ORDER — SENNOSIDES-DOCUSATE SODIUM 8.6-50 MG PO TABS
2.0000 | ORAL_TABLET | ORAL | Status: DC
  Administered 2017-09-20 – 2017-09-22 (×3): 2 via ORAL
  Filled 2017-09-20 (×4): qty 2

## 2017-09-20 MED ORDER — FENTANYL CITRATE (PF) 100 MCG/2ML IJ SOLN
INTRAMUSCULAR | Status: DC | PRN
  Administered 2017-09-20: 10 ug via INTRATHECAL
  Administered 2017-09-20: 90 ug via INTRAVENOUS

## 2017-09-20 MED ORDER — LACTATED RINGERS IV SOLN
INTRAVENOUS | Status: DC
  Administered 2017-09-20 (×2): via INTRAVENOUS

## 2017-09-20 MED ORDER — FENTANYL CITRATE (PF) 100 MCG/2ML IJ SOLN
INTRAMUSCULAR | Status: AC
  Filled 2017-09-20: qty 2

## 2017-09-20 MED ORDER — ONDANSETRON HCL 4 MG/2ML IJ SOLN
INTRAMUSCULAR | Status: DC | PRN
  Administered 2017-09-20: 4 mg via INTRAVENOUS

## 2017-09-20 MED ORDER — NALBUPHINE HCL 10 MG/ML IJ SOLN: 5.0000 mg | INTRAMUSCULAR | Status: DC | PRN

## 2017-09-20 MED ORDER — PHENYLEPHRINE HCL 10 MG/ML IJ SOLN
INTRAMUSCULAR | Status: DC | PRN
  Administered 2017-09-20: 120 ug via INTRAVENOUS

## 2017-09-20 MED ORDER — SCOPOLAMINE 1 MG/3DAYS TD PT72
MEDICATED_PATCH | TRANSDERMAL | Status: AC
  Filled 2017-09-20: qty 1

## 2017-09-20 MED ORDER — SIMETHICONE 80 MG PO CHEW
80.0000 mg | CHEWABLE_TABLET | ORAL | Status: DC
  Administered 2017-09-20 – 2017-09-22 (×3): 80 mg via ORAL
  Filled 2017-09-20 (×3): qty 1

## 2017-09-20 MED ORDER — PHENYLEPHRINE 8 MG IN D5W 100 ML (0.08MG/ML) PREMIX OPTIME
INJECTION | INTRAVENOUS | Status: AC
  Filled 2017-09-20: qty 100

## 2017-09-20 MED ORDER — SIMETHICONE 80 MG PO CHEW: 80.0000 mg | CHEWABLE_TABLET | ORAL | Status: DC | PRN

## 2017-09-20 MED ORDER — OXYTOCIN 10 UNIT/ML IJ SOLN
INTRAMUSCULAR | Status: DC | PRN
  Administered 2017-09-20: 40 [IU] via INTRAVENOUS

## 2017-09-20 MED ORDER — ACETAMINOPHEN 325 MG PO TABS
650.0000 mg | ORAL_TABLET | ORAL | Status: DC | PRN
  Administered 2017-09-22: 650 mg via ORAL
  Filled 2017-09-20 (×2): qty 2

## 2017-09-20 MED ORDER — HYDROMORPHONE HCL 1 MG/ML IJ SOLN
INTRAMUSCULAR | Status: DC | PRN
  Administered 2017-09-20: 1 mg via INTRAVENOUS

## 2017-09-20 MED ORDER — COCONUT OIL OIL: 1.0000 "application " | TOPICAL_OIL | Status: DC | PRN

## 2017-09-20 MED ORDER — OXYCODONE-ACETAMINOPHEN 5-325 MG PO TABS: 2.0000 | ORAL_TABLET | ORAL | Status: DC | PRN

## 2017-09-20 MED ORDER — NALOXONE HCL 4 MG/10ML IJ SOLN
1.0000 ug/kg/h | INTRAVENOUS | Status: DC | PRN
  Filled 2017-09-20: qty 5

## 2017-09-20 MED ORDER — PHENYLEPHRINE 8 MG IN D5W 100 ML (0.08MG/ML) PREMIX OPTIME
INJECTION | INTRAVENOUS | Status: DC | PRN
  Administered 2017-09-20: 60 ug/min via INTRAVENOUS

## 2017-09-20 MED ORDER — SCOPOLAMINE 1 MG/3DAYS TD PT72
1.0000 | MEDICATED_PATCH | Freq: Once | TRANSDERMAL | Status: AC
  Administered 2017-09-20: 1.5 mg via TRANSDERMAL

## 2017-09-20 MED ORDER — NALBUPHINE HCL 10 MG/ML IJ SOLN: 5.0000 mg | Freq: Once | INTRAMUSCULAR | Status: DC | PRN

## 2017-09-20 MED ORDER — CLINDAMYCIN PHOSPHATE 900 MG/50ML IV SOLN
900.0000 mg | Freq: Once | INTRAVENOUS | Status: DC
  Filled 2017-09-20: qty 50

## 2017-09-20 MED ORDER — MENTHOL 3 MG MT LOZG: 1.0000 | LOZENGE | OROMUCOSAL | Status: DC | PRN

## 2017-09-20 MED ORDER — DEXAMETHASONE SODIUM PHOSPHATE 10 MG/ML IJ SOLN
INTRAMUSCULAR | Status: AC
Start: 2017-09-20 — End: ?
  Filled 2017-09-20: qty 1

## 2017-09-20 MED ORDER — LACTATED RINGERS IV SOLN
INTRAVENOUS | Status: DC | PRN
  Administered 2017-09-20: 09:00:00 via INTRAVENOUS

## 2017-09-20 MED ORDER — ACETAMINOPHEN 500 MG PO TABS
1000.0000 mg | ORAL_TABLET | Freq: Four times a day (QID) | ORAL | Status: AC
  Administered 2017-09-20 – 2017-09-21 (×4): 1000 mg via ORAL
  Filled 2017-09-20 (×4): qty 2

## 2017-09-20 MED ORDER — KETOROLAC TROMETHAMINE 30 MG/ML IJ SOLN: 30.0000 mg | Freq: Four times a day (QID) | INTRAMUSCULAR | Status: AC | PRN

## 2017-09-20 MED ORDER — CALCIUM CARBONATE ANTACID 500 MG PO CHEW: 3.0000 | CHEWABLE_TABLET | Freq: Every day | ORAL | Status: DC | PRN

## 2017-09-20 MED ORDER — SODIUM CHLORIDE 0.9 % IR SOLN
Status: DC | PRN
  Administered 2017-09-20: 1000 mL

## 2017-09-20 MED ORDER — STERILE WATER FOR IRRIGATION IR SOLN
Status: DC | PRN
  Administered 2017-09-20: 1000 mL

## 2017-09-20 MED ORDER — SIMETHICONE 80 MG PO CHEW
80.0000 mg | CHEWABLE_TABLET | Freq: Three times a day (TID) | ORAL | Status: DC
  Administered 2017-09-20 – 2017-09-23 (×9): 80 mg via ORAL
  Filled 2017-09-20 (×9): qty 1

## 2017-09-20 SURGICAL SUPPLY — 42 items
BARRIER ADHS 3X4 INTERCEED (GAUZE/BANDAGES/DRESSINGS) ×3 IMPLANT
BENZOIN TINCTURE PRP APPL 2/3 (GAUZE/BANDAGES/DRESSINGS) ×3 IMPLANT
CHLORAPREP W/TINT 26ML (MISCELLANEOUS) ×3 IMPLANT
CLAMP CORD UMBIL (MISCELLANEOUS) IMPLANT
CLOSURE STERI STRIP 1/2 X4 (GAUZE/BANDAGES/DRESSINGS) ×3 IMPLANT
CLOTH BEACON ORANGE TIMEOUT ST (SAFETY) ×3 IMPLANT
DRESSING PREVENA PLUS CUSTOM (GAUZE/BANDAGES/DRESSINGS) ×1 IMPLANT
DRSG OPSITE POSTOP 4X10 (GAUZE/BANDAGES/DRESSINGS) ×3 IMPLANT
DRSG PREVENA PLUS CUSTOM (GAUZE/BANDAGES/DRESSINGS) ×3
ELECT REM PT RETURN 9FT ADLT (ELECTROSURGICAL) ×3
ELECTRODE REM PT RTRN 9FT ADLT (ELECTROSURGICAL) ×1 IMPLANT
EXTRACTOR VACUUM KIWI (MISCELLANEOUS) IMPLANT
GLOVE BIOGEL M 6.5 STRL (GLOVE) ×6 IMPLANT
GLOVE BIOGEL PI IND STRL 6.5 (GLOVE) ×1 IMPLANT
GLOVE BIOGEL PI IND STRL 7.0 (GLOVE) ×1 IMPLANT
GLOVE BIOGEL PI INDICATOR 6.5 (GLOVE) ×2
GLOVE BIOGEL PI INDICATOR 7.0 (GLOVE) ×2
GOWN STRL REUS W/TWL LRG LVL3 (GOWN DISPOSABLE) ×9 IMPLANT
KIT ABG SYR 3ML LUER SLIP (SYRINGE) IMPLANT
KIT PREVENA INCISION MGT20CM45 (CANNISTER) ×3 IMPLANT
NEEDLE HYPO 25X5/8 SAFETYGLIDE (NEEDLE) IMPLANT
NS IRRIG 1000ML POUR BTL (IV SOLUTION) ×3 IMPLANT
PACK C SECTION WH (CUSTOM PROCEDURE TRAY) ×3 IMPLANT
PAD OB MATERNITY 4.3X12.25 (PERSONAL CARE ITEMS) ×3 IMPLANT
PENCIL SMOKE EVAC W/HOLSTER (ELECTROSURGICAL) ×3 IMPLANT
RETRACTOR TRAXI PANNICULUS (MISCELLANEOUS) ×1 IMPLANT
RTRCTR C-SECT PINK 25CM LRG (MISCELLANEOUS) ×3 IMPLANT
SPONGE LAP 18X18 X RAY DECT (DISPOSABLE) ×6 IMPLANT
STRIP CLOSURE SKIN 1/2X4 (GAUZE/BANDAGES/DRESSINGS) ×2 IMPLANT
SUT PDS AB 0 CT1 27 (SUTURE) ×9 IMPLANT
SUT PLAIN 0 NONE (SUTURE) IMPLANT
SUT VIC AB 0 CTX 36 (SUTURE) ×6
SUT VIC AB 0 CTX36XBRD ANBCTRL (SUTURE) ×3 IMPLANT
SUT VIC AB 2-0 CT1 (SUTURE) ×3 IMPLANT
SUT VIC AB 2-0 CT1 27 (SUTURE) ×2
SUT VIC AB 2-0 CT1 TAPERPNT 27 (SUTURE) ×1 IMPLANT
SUT VIC AB 3-0 SH 27 (SUTURE)
SUT VIC AB 3-0 SH 27X BRD (SUTURE) IMPLANT
SUT VIC AB 4-0 KS 27 (SUTURE) ×3 IMPLANT
TOWEL OR 17X24 6PK STRL BLUE (TOWEL DISPOSABLE) ×3 IMPLANT
TRAXI PANNICULUS RETRACTOR (MISCELLANEOUS) ×2
TRAY FOLEY W/BAG SLVR 14FR LF (SET/KITS/TRAYS/PACK) ×3 IMPLANT

## 2017-09-20 NOTE — Anesthesia Postprocedure Evaluation (Signed)
Anesthesia Post Note  Patient: Erica Ramos  Procedure(s) Performed: CESAREAN SECTION (N/A )     Patient location during evaluation: Mother Baby Anesthesia Type: Spinal Level of consciousness: awake and alert and oriented Pain management: satisfactory to patient Vital Signs Assessment: post-procedure vital signs reviewed and stable Respiratory status: spontaneous breathing and nonlabored ventilation Cardiovascular status: stable Postop Assessment: no headache, no backache, patient able to bend at knees, no signs of nausea or vomiting and adequate PO intake Anesthetic complications: no    Last Vitals:  Vitals:   09/20/17 1321 09/20/17 1322  BP: 136/80   Pulse: 64 95  Resp: 20   Temp: 36.9 C   SpO2:      Last Pain:  Vitals:   09/20/17 1322  TempSrc:   PainSc: 0-No pain   Pain Goal:                 Mouhamadou Gittleman

## 2017-09-20 NOTE — Op Note (Signed)
Cesarean Section Procedure Note  Indications: chronic hypertension affecting pregnancy   Pre-operative Diagnosis: 38 week 2 day pregnancy.  Post-operative Diagnosis: same  Surgeon: Jessee Avers.   Assistants: Geryl Rankins M.D.   Anesthesia: Spinal   ASA Class: 2   Procedure Details   The patient was seen in the Holding Room. The risks, benefits, complications, treatment options, and expected outcomes were discussed with the patient.  The patient concurred with the proposed plan, giving informed consent.  The site of surgery properly noted/marked. The patient was taken to Operating Room # 9, identified as Erica Ramos and the procedure verified as C-Section Delivery. A Time Out was held and the above information confirmed.  After induction of anesthesia, the patient was draped and prepped in the usual sterile manner. A Pfannenstiel incision was made and carried down through the subcutaneous tissue to the fascia. Fascial incision was made and extended transversely. The fascia was separated from the underlying rectus tissue superiorly and inferiorly. The peritoneum was identified and entered. Peritoneal incision was extended longitudinally. The utero-vesical peritoneal reflection was incised transversely and the bladder flap was bluntly freed from the lower uterine segment. A low transverse uterine incision was made. Delivered from cephalic presentation was a 4690 gram Female with Apgar scores of 9 at one minute and 9 at five minutes. After the umbilical cord was clamped and cut cord blood was obtained for evaluation. The placenta was removed intact and appeared normal. The uterine outline, tubes and ovaries appeared normal. The uterine incision was closed with running locked sutures of 0 vicryl.. Hemostasis was observed. Lavage was carried out until clear. Interseed was placed along the fascial incision. The fascia was then reapproximated with running sutures of 0 pds. The skin was  reapproximated with 4-0 viryl .  Instrument, sponge, and needle counts were correct prior the abdominal closure and at the conclusion of the case.   Findings: Normal fallopian tubes bilaterally . The left ovary was not visualized. The right ovary appeared normal.   Estimated Blood Loss:  per anesthesia          Drains: None         Total IV Fluids:  Per anesthesia ml         Specimens: Placenta to labor and delivery           Implants: None         Complications:  None; patient tolerated the procedure well.         Disposition: PACU - hemodynamically stable.         Condition: stable  Attending Attestation: I performed the procedure.

## 2017-09-20 NOTE — Anesthesia Postprocedure Evaluation (Signed)
Anesthesia Post Note  Patient: Erica Ramos  Procedure(s) Performed: CESAREAN SECTION (N/A )     Patient location during evaluation: PACU Anesthesia Type: Spinal Level of consciousness: awake and alert Pain management: pain level controlled Vital Signs Assessment: post-procedure vital signs reviewed and stable Respiratory status: spontaneous breathing and respiratory function stable Cardiovascular status: blood pressure returned to baseline and stable Postop Assessment: spinal receding Anesthetic complications: no    Last Vitals:  Vitals:   09/20/17 1100 09/20/17 1115  BP: (!) 144/79 (!) 147/89  Pulse: 82 80  Resp: 20 20  Temp:  36.9 C  SpO2: 98% 100%    Last Pain:  Vitals:   09/20/17 1115  TempSrc: Oral  PainSc: 0-No pain   Pain Goal:                 Kennieth Rad

## 2017-09-20 NOTE — H&P (Signed)
JOZIE WULF is a 42 y.o. female presenting for repeat cesarean section at 38 wks and 2 days due to chronic hypertension. . Prenatal care provided by Dr.Chaunce Winkels Richardson Dopp with [facility]. Pt denies headache visual disturbances or ruq pain.    OB History    Gravida  3   Para  1   Term  1   Preterm      AB  1   Living  1     SAB  1   TAB      Ectopic      Multiple  0   Live Births  1          Past Medical History:  Diagnosis Date  . Anxiety   . Bronchitis    chronic - has flare every December  . Cervical polyp    hx of  . Environmental allergies   . Genital warts   . GERD (gastroesophageal reflux disease)    Per MD chart note 12/01/2010  . Heart murmur   . Hx of abnormal Pap smear 2012  . Hypertension   . Morbid obesity (HCC)    BMI 47.9  . Persistent headaches    worst during menstrual cycle  . PVC (premature ventricular contraction) 02/20/2015  . STD (sexually transmitted disease)    hx genital warts/?HSV  . Vaginal Pap smear, abnormal    Past Surgical History:  Procedure Laterality Date  . CESAREAN SECTION N/A 06/05/2016   Procedure: CESAREAN SECTION;  Surgeon: Hoover Browns, MD;  Location: [facility];  Service: Obstetrics;  Laterality: N/A;  . COLPOSCOPY  2012   no treatment to cervix--pap smears reverted to normal  . WISDOM TOOTH EXTRACTION     Family History: family history includes Alcohol abuse in her father; Diabetes in her father, maternal aunt, maternal grandmother, maternal uncle, and mother; Heart attack in her paternal grandmother; Hypertension in her maternal grandmother, mother, other, and sister; Obesity in her other; Seizures in her maternal aunt. Social History:  reports that she has never smoked. She has never used smokeless tobacco. She reports that she drinks about 0.6 oz of alcohol per week. She reports that she does not use drugs.  Allergies Penicillin and Neosporin     Maternal Diabetes: No Genetic Screening: Normal Maternal  Ultrasounds/Referrals: Normal Fetal Ultrasounds or other Referrals:  None Maternal Substance Abuse:  No Significant Maternal Medications:  Meds include: Other:  Significant Maternal Lab Results:  None Other Comments:  None  Review of Systems  Constitutional: Negative.   HENT: Negative.   Eyes: Negative.   Respiratory: Negative.   Cardiovascular: Negative.   Gastrointestinal: Negative.   Genitourinary: Negative.   Musculoskeletal: Negative.   Skin: Negative.   Neurological: Negative.   Endo/Heme/Allergies: Negative.   Psychiatric/Behavioral: Negative.    History   Blood pressure (!) 178/92, pulse (!) 107, temperature 97.9 F (36.6 C), temperature source Oral, resp. rate 16, height  (1.6 m), weight 122.7 kg (270 lb 9.6 oz), last menstrual period 01/04/2017, unknown if currently breastfeeding. Exam Physical Exam  Vitals reviewed. Constitutional: She is oriented to person, place, and time. She appears well-developed and well-nourished.  HENT:  Head: Normocephalic and atraumatic.  Eyes: Pupils are equal, round, and reactive to light. Conjunctivae are normal.  Neck: Normal range of motion. Neck supple.  Cardiovascular: Normal rate and regular rhythm.  Respiratory: Effort normal and breath sounds normal.  GI: There is no tenderness.  Genitourinary: Vagina normal.  Musculoskeletal: Normal range of motion.  She exhibits edema.  Neurological: She is alert and oriented to person, place, and time. She has normal reflexes.  Skin: Skin is warm and dry.  Psychiatric: She has a normal mood and affect.    Prenatal labs: ABO, Rh: --/--/A POS (05/21 1141) Antibody: NEG (05/21 1141) Rubella: Immune (10/18 0000) RPR: Non Reactive (05/21 1141)  HBsAg: Negative (10/18 0000)  HIV: Non-reactive (10/18 0000)  GBS:     Assessment/Plan: 38 wks and 2 days with chronic hypertension. H/'o cesrean section for repeat cesarean setion. R/b/a discussed including but not limited to infection /  bleeding/ damage to bowel bladder and surrounding organs with the need for futher surgery. R/o transfusion discussed. Pt voiced understanding and desires to proceed   Merrit Waugh J. 09/20/2017, 7:29 AM

## 2017-09-20 NOTE — Anesthesia Procedure Notes (Addendum)
Spinal  Patient location during procedure: OR Start time: 09/20/2017 8:02 AM End time: 09/20/2017 8:08 AM Staffing Anesthesiologist: Marcene Duos, MD Performed: anesthesiologist  Preanesthetic Checklist Completed: patient identified, site marked, surgical consent, pre-op evaluation, timeout performed, IV checked, risks and benefits discussed and monitors and equipment checked Spinal Block Patient position: sitting Prep: site prepped and draped and DuraPrep Patient monitoring: blood pressure, continuous pulse ox and heart rate Approach: midline Location: L4-5 Injection technique: single-shot Needle Needle type: Pencan  Needle gauge: 24 G Needle length: 9 cm Needle insertion depth: 8 cm Assessment Sensory level: T4

## 2017-09-20 NOTE — Addendum Note (Signed)
Addendum  created 09/20/17 1337 by Shanon Payor, CRNA   Sign clinical note

## 2017-09-20 NOTE — Progress Notes (Signed)
Patient tolerated getting OOB very slowly with lots of emotional support. She was anxious and nervous. Took >I hour for orthostatics, peri care, and hygiene care. Upon standing, patient's pad shifted and pooling of uterine bleeding noted on the floor. Uterus remained firm, no clots and minimal bleeding noted when patient sat on the toilet for peri care. Patient had some hyperventilating episode, calmed with slow breathing and emotional support. Returned to bed. She did not become lightheaded or faint.

## 2017-09-20 NOTE — Transfer of Care (Signed)
Immediate Anesthesia Transfer of Care Note  Patient: Erica Ramos  Procedure(s) Performed: CESAREAN SECTION (N/A )  Patient Location: PACU  Anesthesia Type:Spinal  Level of Consciousness: awake, alert  and oriented  Airway & Oxygen Therapy: Patient Spontanous Breathing  Post-op Assessment: Report given to RN and Post -op Vital signs reviewed and stable  Post vital signs: Reviewed and stable  Last Vitals:  Vitals Value Taken Time  BP 142/79 09/20/2017 10:04 AM  Temp    Pulse 104 09/20/2017 10:07 AM  Resp 13 09/20/2017 10:07 AM  SpO2 100 % 09/20/2017 10:07 AM  Vitals shown include unvalidated device data.  Last Pain:  Vitals:   09/20/17 0610  TempSrc: Oral  PainSc: 0-No pain         Complications: No apparent anesthesia complications

## 2017-09-20 NOTE — Anesthesia Procedure Notes (Signed)
Anesthesia Regional Block: Narrative:       

## 2017-09-21 LAB — CBC
HEMATOCRIT: 31.2 % — AB (ref 36.0–46.0)
HEMOGLOBIN: 10.3 g/dL — AB (ref 12.0–15.0)
MCH: 26.8 pg (ref 26.0–34.0)
MCHC: 33 g/dL (ref 30.0–36.0)
MCV: 81 fL (ref 78.0–100.0)
Platelets: 212 10*3/uL (ref 150–400)
RBC: 3.85 MIL/uL — ABNORMAL LOW (ref 3.87–5.11)
RDW: 15.2 % (ref 11.5–15.5)
WBC: 12.7 10*3/uL — ABNORMAL HIGH (ref 4.0–10.5)

## 2017-09-21 NOTE — Progress Notes (Addendum)
MOB was referred for history of depression/anxiety. * Referral screened out by Clinical Social Worker because none of the following criteria appear to apply: ~ History of anxiety/depression during this pregnancy, or of post-partum depression. ~ Diagnosis of anxiety and/or depression within last 3 years OR * MOB's symptoms currently being treated with medication and/or therapy. Please contact the Clinical Social Worker if needs arise, by [facility] request, or if MOB scores greater than 9/yes to question 10 on Edinburgh Postpartum Depression Screen.  MOB had assessment completed with a CSW at delivery admission in 2018 and stated that her anxiety was situational to the loss of a loved one years ago.

## 2017-09-21 NOTE — Lactation Note (Signed)
This note was copied from a baby's chart. Lactation Consultation Note Baby 19 hrs old BF well to Rt. Nipple. Mom stated that she had to use NS to Lt. Nipple while BF her 42 month old.  Moms breast are "V" shaped, soft w/nipples at the bottom end of breast.  Lt. Breast nipple needs to be stimulated to evert well for latching. Mom has large nipples w/inverted center. No colostrum noted while hand expressing. Breast very soft and compressible.  Mom stated she had to supplement w/her first son until her milk came in well. Discussed monitoring for I&O, and weight loss, and baby's satisfaction of feedings.  Encouraged mom to pump, will ask RN to set up DEBP for stimulation and induction of milk.  Baby BF well in football position to Rt. Breast when LC entered rm. When baby unlatched self, LC assisted in Lt. Breast. Baby latched easily. Reviewed and demonstrated hand positioning and obtaining deep latch.  Newborn behavior discussed, cluster feeding, supply and demand reviewed. Mom encouraged to feed baby 8-12 times/24 hours and with feeding cues.  Encouraged to call for assistance. Noted baby jittery when uncovered. Mom stated he was checked earlier for that and was OK. Will report to RN. [facility] brochure given w/resources, support groups and LC services.  Patient Name: Erica Ramos [id number] Date: 09/21/2017 Reason for consult: Initial assessment   Maternal Data Has patient been taught Hand Expression?: Yes Does the patient have breastfeeding experience prior to this delivery?: Yes  Feeding Feeding Type: Breast Fed Length of feed: 15 min  LATCH Score Latch: Grasps breast easily, tongue down, lips flanged, rhythmical sucking.  Audible Swallowing: None  Type of Nipple: Everted at rest and after stimulation  Comfort (Breast/Nipple): Soft / non-tender  Hold (Positioning): Assistance needed to correctly position infant at breast and maintain latch.  LATCH Score:  7  Interventions Interventions: Adjust position;Breast feeding basics reviewed;Assisted with latch;Support pillows;Skin to skin;Position options;Breast massage;Hand express;Breast compression  Lactation Tools Discussed/Used WIC Program: No   Consult Status Consult Status: Follow-up Date: 09/22/17 Follow-up type: In-patient    Charyl Dancer 09/21/2017, 4:24 AM

## 2017-09-21 NOTE — Lactation Note (Addendum)
This note was copied from a baby's chart. Lactation Consultation Note  Patient Name: Erica Ramos ZOXWR'[id number] Date: 09/21/2017 Reason for consult: Follow-up assessment   P2, Baby 30 hours old.  Baby had 8 stools in the first 24 hours.  Mother used nipple shield with first child and she states her milk supply was delayed. Baby has short anterior lingual frenulum.  Mother states her first child did also. Baby recently breastfed and is sleeping. Visitors in room.  Mother states latch is improving. She has only pumped once.   Encouraged her to pump every other feeding 4-6 times per day for 10-20 min with DEBP on initiation setting. Reminded mother to hand express. Mother will call tonight if further assistance is needed.  Mother called for assistance and is worried about her milk supply. Reviewed hand expression with great flow of colostrum.  Mother happy. Unwrapped baby for feeding and assisted w/ football hold. Baby opened wide and latched deep on R breast. Sucks and swallows observed and heard. Encouraged mother to compress breast during feeding. Recommend mother post pump 4-6 times per day for 10-20 min with DEBP on initiation setting for assurance. Give baby back volume pumped at the next feeding.     Maternal Data    Feeding Feeding Type: Breast Fed Length of feed: 15 min  LATCH Score Latch: Grasps breast easily, tongue down, lips flanged, rhythmical sucking.  Audible Swallowing: A few with stimulation  Type of Nipple: Everted at rest and after stimulation  Comfort (Breast/Nipple): Soft / non-tender  Hold (Positioning): Assistance needed to correctly position infant at breast and maintain latch.  LATCH Score: 8  Interventions    Lactation Tools Discussed/Used Tools: Nipple [name]   Consult Status Consult Status: Follow-up Date: 09/22/17 Follow-up type: In-patient    [name] [facility] 09/21/2017, 3:16 PM

## 2017-09-21 NOTE — Progress Notes (Signed)
Postoperative Note Day # 1  S:  Patient resting comfortable in bed.  Pain controlled.  Tolerating general diet. + flatus, no BM.  Lochia moderate.  Some ambulation, but minimal. She denies n/v/f/c, SOB, or CP.  Pt plans on breastfeeding.  Foley in place this am  O: Temp:  [98 F (36.7 C)-98.5 F (36.9 C)] 98 F (36.7 C) (05/23 4098) Pulse Rate:  [64-107] 64 (05/23 0613) Resp:  [12-25] 20 (05/23 0613) BP: (101-178)/(65-97) 101/71 (05/23 0613) SpO2:  [98 %-100 %] 100 % (05/23 1191)   Gen: A&Ox3, NAD CV: RRR, no MRG Resp: CTAB Abdomen: obese, soft, NT, ND +BS Uterus: firm, non-tender, below umbilicus Incision: c/d/i, bandage on Ext: No edema, no calf tenderness bilaterally, SCDs in place  Labs:  CBC Latest Ref Rng & Units 09/21/2017 09/20/2017 09/19/2017  WBC 4.0 - 10.5 K/uL 12.7(H) 13.5(H) 10.1  Hemoglobin 12.0 - 15.0 g/dL 10.3(L) 13.2 12.2  Hematocrit 36.0 - 46.0 % 31.2(L) 39.4 35.8(L)  Platelets 150 - 400 K/uL 212 279 248    A/P: Pt is a 42 y.o. [id number] s/p repeat C-section, POD#1 -Chronic HTN  BP within normal limits, continue Procardia XL  daily, pt asymptomatic - Pain well controlled -GU: UOP is adequate, plan to remove foley today -GI: Tolerating general diet -Activity: encouraged sitting up to chair and ambulation as tolerated -Prophylaxis: early ambulation, SCDs while in bed, Lovenox daily -Labs: appropriate decline due to recent surgery, continue iron orally -Anxiety: no medication currently, mood appropriate  Myna Hidalgo, DO [phone] (pager) [phone] (office)

## 2017-09-22 MED ORDER — BISACODYL 10 MG RE SUPP
10.0000 mg | Freq: Once | RECTAL | Status: AC
  Administered 2017-09-22: 10 mg via RECTAL
  Filled 2017-09-22: qty 1

## 2017-09-22 NOTE — Lactation Note (Addendum)
This note was copied from a baby's chart. Lactation Consultation Note  Patient Name: Erica Ramos [id number] Date: 09/22/2017 Reason for consult: Follow-up assessment;Early term 37-38.6wks;Infant weight loss(8% weight loss, at 40 hours old Bili check 8.7 ) Baby is 47 hours old  LC reviewed doc flow sheets - Voids QS, stools in life 8 and none in the last 24 hours ( Last stool - 5/23 at 0150 )  Latch scores 8-9's and one 7.  Per mom baby recently fed for 17 mins with swallows, and it was comfortable. Mom denies soreness.  And initially asked for a NS and has not used it. Mom had to use one with her 1st abby and that why she asked for one.  Sore nipple and engorgement prevention and tx reviewed. Mom already has  DEBP Kit, and a DEBP Medela at home.  Mother informed of post-discharge support and given phone number to the lactation department, including services for phone call assistance; out-patient appointments; and breastfeeding support group. List of other breastfeeding resources in the community given in the handout. Encouraged mother to call for problems or concerns related to breastfeeding.  Agenda praised mom for her breast feeding efforts.   Maternal Data    Feeding Feeding Type: (baby just fed 17 mins ) Length of feed: 17 min(per mom )  LATCH Score ( Latch score by the MBU RN )  Latch: Grasps breast easily, tongue down, lips flanged, rhythmical sucking.  Audible Swallowing: Spontaneous and intermittent  Type of Nipple: Everted at rest and after stimulation  Comfort (Breast/Nipple): Soft / non-tender  Hold (Positioning): Assistance needed to correctly position infant at breast and maintain latch.  LATCH Score: 9  Interventions Interventions: Breast feeding basics reviewed  Lactation Tools Discussed/Used Nipple shield size: (per mom asked for it and has not used it ) Pump Review: Milk Storage   Consult Status Consult Status: Complete Date: 09/22/17    Erica Ramos 09/22/2017, 9:43 AM

## 2017-09-22 NOTE — Progress Notes (Signed)
Post Partum Day 2 cesarean section  Subjective: no complaints, up ad lib, voiding, tolerating PO and + flatus  Objective: Blood pressure 129/70, pulse 82, temperature 98.4 F (36.9 C), temperature source Oral, resp. rate 18, height  (1.6 m), weight 122.7 kg (270 lb 9.6 oz), last menstrual period 01/04/2017, SpO2 100 %, unknown if currently breastfeeding.  Physical Exam:  General: alert and cooperative Lochia: appropriate Uterine Fundus: firm Incision: Prevena negative pressure dressing in place  DVT Evaluation: No evidence of DVT seen on physical exam.  Recent Labs    09/20/17 0708 09/21/17 0538  HGB 13.2 10.3*  HCT 39.4 31.2*    Assessment/Plan: Plan for discharge tomorrow and Breastfeeding  Planning outpatient circumcision  Chronic hypertension - well controlled with procardia XL 30 mg    LOS: 2 days   Felisa Zechman J. 09/22/2017, 5:11 PM

## 2017-09-23 MED ORDER — OXYCODONE-ACETAMINOPHEN 5-325 MG PO TABS: 1.0000 | ORAL_TABLET | ORAL | 0 refills | Status: AC | PRN

## 2017-09-23 MED ORDER — ENOXAPARIN SODIUM 60 MG/0.6ML ~~LOC~~ SOLN
60.0000 mg | SUBCUTANEOUS | Status: DC
Start: 2017-09-24 — End: 2017-09-23

## 2017-09-23 MED ORDER — SERTRALINE HCL 25 MG PO TABS
25.0000 mg | ORAL_TABLET | Freq: Every day | ORAL | 6 refills | Status: DC
Start: 2017-09-23 — End: 2018-04-19

## 2017-09-23 MED ORDER — IBUPROFEN 600 MG PO TABS: 600.0000 mg | ORAL_TABLET | Freq: Four times a day (QID) | ORAL | 1 refills | Status: DC | PRN

## 2017-09-23 MED ORDER — SERTRALINE HCL 25 MG PO TABS
25.0000 mg | ORAL_TABLET | Freq: Every day | ORAL | Status: DC
  Administered 2017-09-23: 25 mg via ORAL
  Filled 2017-09-23: qty 1

## 2017-09-23 NOTE — Discharge Summary (Signed)
OB Discharge Summary     Patient Name: Erica Ramos DOB: Sep 16, 1975 MRN: [id number]  Date of admission: 09/20/2017 Delivering MD: Gerald Leitz   Date of discharge: 09/23/2017  Admitting diagnosis: HO Cesarean Section Intrauterine pregnancy: [redacted]w[redacted]d     Secondary diagnosis:  Active Problems:   Hypertension affecting pregnancy in third trimester   Chronic hypertension  Additional problems: None     Discharge diagnosis: Term Pregnancy Delivered and CHTN                                                                                                Post partum procedures:None  Augmentation: NA  Complications: None  Hospital course:  Sceduled C/S   42 y.o. yo [id number] at [redacted]w[redacted]d was admitted to the hospital 09/20/2017 for scheduled cesarean section with the following indication:Elective Repeat.  Membrane Rupture Time/Date: 8:48 AM ,09/20/2017   Patient delivered a Viable infant.09/20/2017  Details of operation can be found in separate operative note.  Pateint had an uncomplicated postpartum course.  She is ambulating, tolerating a regular diet, passing flatus, and urinating well. Patient is discharged home in stable condition on  09/23/17         Physical exam  Vitals:   09/22/17 0544 09/22/17 1803 09/22/17 2045 09/23/17 0540  BP: 129/70 140/87 140/72 140/88  Pulse: 82 89 90 83  Resp: Temp: 98.4 F (36.9 C) 98.5 F (36.9 C) 98.2 F (36.8 C) 98.4 F (36.9 C)  TempSrc: Oral Oral Oral Oral  SpO2:  100% 100% 100%  Weight:      Height:       General: alert, cooperative and no distress Lochia: appropriate Uterine Fundus: firm Incision: Dressing is clean, dry, and intact DVT Evaluation: No evidence of DVT seen on physical exam. Labs: Lab Results  Component Value Date   WBC 12.7 (H) 09/21/2017   HGB 10.3 (L) 09/21/2017   HCT 31.2 (L) 09/21/2017   MCV 81.0 09/21/2017   PLT 212 09/21/2017   CMP Latest Ref Rng & Units 09/20/2017  Glucose 65 - 99 mg/dL 92  BUN 6  - 20 mg/dL 6  Creatinine 4.03 - 4.74 mg/dL 2.59  Sodium 563 - 875 mmol/L 137  Potassium 3.5 - 5.1 mmol/L 4.0  Chloride 101 - 111 mmol/L 108  CO2 22 - 32 mmol/L 18(L)  Calcium 8.9 - 10.3 mg/dL 6.4(P)  Total Protein 6.5 - 8.1 g/dL 6.0(L)  Total Bilirubin 0.3 - 1.2 mg/dL 0.4  Alkaline Phos 38 - 126 U/L 106  AST 15 - 41 U/L 19  ALT 14 - 54 U/L 15    Discharge instruction: per After Visit Summary and "Baby and Me Booklet".  After visit meds:  Allergies as of 09/23/2017      Reactions   Neosporin [neomycin-bacitracin Zn-polymyx] Anaphylaxis   Penicillins Nausea And Vomiting, Rash, Other (See Comments)   Has patient had a PCN reaction causing immediate rash, facial/tongue/throat swelling, SOB or lightheadedness with hypotension: Yes Has patient had a PCN reaction causing severe rash involving mucus membranes or skin necrosis: Yes Has patient had  a PCN reaction that required hospitalization Yes Has patient had a PCN reaction occurring within the last 10 years: No If all of the above answers are "NO", then may proceed with Cephalosporin use.      Medication List    STOP taking these medications   azithromycin 250 MG tablet Commonly known as:  ZITHROMAX   hydrochlorothiazide 25 MG tablet Commonly known as:  HYDRODIURIL     TAKE these medications   acetaminophen 500 MG tablet Commonly known as:  TYLENOL Take 500-1,000 mg by mouth daily as needed for headache.   calcium carbonate 500 MG chewable tablet Commonly known as:  TUMS - dosed in mg elemental calcium Chew 3-4 tablets by mouth daily as needed for indigestion or heartburn.   cetirizine 10 MG tablet Commonly known as:  ZYRTEC Take 1 tablet (10 mg total) by mouth daily.   docusate sodium 100 MG capsule Commonly known as:  COLACE Take 200-300 mg by mouth daily as needed for mild constipation.   GARLIC PO Take 1 capsule by mouth daily.   ibuprofen 600 MG tablet Commonly known as:  ADVIL,MOTRIN Take 1 tablet (600 mg  total) by mouth every 6 (six) hours as needed. What changed:  reasons to take this   multivitamin-prenatal 27-0.8 MG Tabs tablet Take 1 tablet by mouth daily.   NIFEdipine 30 MG 24 hr tablet Commonly known as:  PROCARDIA-XL/ADALAT-CC/NIFEDICAL-XL Take 1 tablet (30 mg total) by mouth daily.   omeprazole 20 MG capsule Commonly known as:  PRILOSEC Take 1 capsule (20 mg total) by mouth daily.   oxyCODONE-acetaminophen 5-325 MG tablet Commonly known as:  PERCOCET/ROXICET Take 1-2 tablets by mouth every 4 (four) hours as needed for up to 7 days (pain scale 4-7).   PREP-HEM RE Place 1 application rectally daily as needed (hemorrhoids).   sertraline 25 MG tablet Commonly known as:  ZOLOFT Take 1 tablet (25 mg total) by mouth daily.   triamcinolone 55 MCG/ACT Aero nasal inhaler Commonly known as:  NASACORT AQ Place 2 sprays into the nose daily. What changed:  how much to take   Vitamin D3 5000 units Caps Take 5,000 Units by mouth daily.       Diet: routine diet  Activity: Advance as tolerated. Pelvic rest for 6 weeks.   Outpatient follow up:09/26/2017 for dressing removal  Follow up Appt:No future appointments. Follow up Visit:No follow-ups on file.  Postpartum contraception: Not Discussed  Newborn Data: Live born female  Birth Weight: 10 lb 5.4 oz (4690 g) APGAR: 9, 9  Newborn Delivery   Birth date/time:  09/20/2017 08:49:00 Delivery type:  C-Section, Low Transverse Trial of labor:  No C-section categorization:  Repeat     Baby Feeding: Breast Disposition:home with mother   09/23/2017 Jessee Avers., MD

## 2018-04-02 ENCOUNTER — Encounter: Payer: Self-pay | Admitting: Family

## 2018-04-02 ENCOUNTER — Ambulatory Visit (INDEPENDENT_AMBULATORY_CARE_PROVIDER_SITE_OTHER): Payer: Self-pay | Admitting: Family

## 2018-04-02 VITALS — BP 151/99 | HR 99 | Temp 98.9°F | Resp 18 | Ht 62.0 in | Wt 267.0 lb

## 2018-04-02 DIAGNOSIS — J329 Chronic sinusitis, unspecified: Secondary | ICD-10-CM

## 2018-04-02 DIAGNOSIS — J40 Bronchitis, not specified as acute or chronic: Secondary | ICD-10-CM

## 2018-04-02 DIAGNOSIS — H6692 Otitis media, unspecified, left ear: Secondary | ICD-10-CM

## 2018-04-02 DIAGNOSIS — I10 Essential (primary) hypertension: Secondary | ICD-10-CM

## 2018-04-02 MED ORDER — BENZONATATE 100 MG PO CAPS: 100.0000 mg | ORAL_CAPSULE | Freq: Three times a day (TID) | ORAL | 0 refills | Status: DC | PRN

## 2018-04-02 MED ORDER — HYDROCHLOROTHIAZIDE 25 MG PO TABS: 25.0000 mg | ORAL_TABLET | Freq: Every day | ORAL | 3 refills | Status: DC

## 2018-04-02 MED ORDER — AZITHROMYCIN 250 MG PO TABS: ORAL_TABLET | ORAL | 0 refills | Status: DC

## 2018-04-02 NOTE — Patient Instructions (Signed)
Please begin azithromycin for bronchitis/sinus infection and left sided ear infection. You may use tessalon as needed for cough.  Call if new/worsening symptoms or if symptoms are not improved in 3-4 days.

## 2018-04-02 NOTE — Progress Notes (Addendum)
Subjective:    Patient ID: Erica Ramos, female    DOB: 1975/08/16, 42 y.o.   MRN: [id number]  HPI  Patient is a 42 yr old female who presents today with chief complaint of cough. Started with congestion about 3 weeks ago.  She used mucinex which seemed to help some. Cough started 3 days ago.  Reports that last night she could not sleep due to the cough.  Kids are sick with similar symptoms.  She denies know fever. Reports + frontal pressure.  Reports that her cough is productive of green sputum.     Review of Systems See HPI  Past Medical History:  Diagnosis Date  . Anxiety   . Bronchitis    chronic - has flare every December  . Cervical polyp    hx of  . Environmental allergies   . Genital warts   . GERD (gastroesophageal reflux disease)    Per MD chart note 12/01/2010  . Heart murmur   . Hx of abnormal Pap smear 2012  . Hypertension   . Morbid obesity (HCC)    BMI 47.9  . Persistent headaches    worst during menstrual cycle  . PVC (premature ventricular contraction) 02/20/2015  . STD (sexually transmitted disease)    hx genital warts/?HSV  . Vaginal Pap smear, abnormal      Social History   Socioeconomic History  . Marital status: Single    Spouse name: Not on file  . Number of children: 0  . Years of education: Not on file  . Highest education level: Not on file  Occupational History    Employer: [employer]  Social Needs  . Financial resource strain: Not on file  . Food insecurity:    Worry: Not on file    Inability: Not on file  . Transportation needs:    Medical: Not on file    Non-medical: Not on file  Tobacco Use  . Smoking status: Never Smoker  . Smokeless tobacco: Never Used  Substance and Sexual Activity  . Alcohol use: Yes    Alcohol/week: 1.0 standard drinks    Types: 1 Standard drinks or equivalent per week    Comment: Occasionally; less than 1-2 a week  . Drug use: No  . Sexual activity: Yes    Partners: Male    Birth  control/protection: Condom    Comment: condoms sometimes  Lifestyle  . Physical activity:    Days per week: Not on file    Minutes per session: Not on file  . Stress: Not on file  Relationships  . Social connections:    Talks on phone: Not on file    Gets together: Not on file    Attends religious service: Not on file    Active member of club or organization: Not on file    Attends meetings of clubs or organizations: Not on file    Relationship status: Not on file  . Intimate partner violence:    Fear of current or ex partner: Not on file    Emotionally abused: Not on file    Physically abused: Not on file    Forced sexual activity: Not on file  Other Topics Concern  . Not on file  Social History Narrative   Exercise--  2-3 x a week for about 30 min- 60 min    Past Surgical History:  Procedure Laterality Date  . CESAREAN SECTION N/A 06/05/2016   Procedure: CESAREAN SECTION;  Surgeon: Hoover Browns, MD;  Location: [facility];  Service: Obstetrics;  Laterality: N/A;  . CESAREAN SECTION N/A 09/20/2017   Procedure: CESAREAN SECTION;  Surgeon: Gerald Leitzole, Tara, MD;  [facility];  Service: Obstetrics;  Laterality: N/A;  EDD 10/02/17  . COLPOSCOPY  2012   no treatment to cervix--pap smears reverted to normal  . WISDOM TOOTH EXTRACTION      Family History  Problem Relation Age of Onset  . Diabetes Mother   . Hypertension Mother        Under control  . Diabetes Maternal Aunt   . Seizures Maternal Aunt   . Diabetes Maternal Grandmother   . Hypertension Maternal Grandmother   . Alcohol abuse Father   . Diabetes Father   . Hypertension Sister   . Diabetes Maternal Uncle   . Hypertension Other   . Obesity Other   . Heart attack Paternal Grandmother   . COPD Neg Hx     Allergies  Allergen Reactions  . Neosporin [Neomycin-Bacitracin Zn-Polymyx] Anaphylaxis  . Penicillins Nausea And Vomiting, Rash and Other (See Comments)    Has patient had a PCN reaction causing  immediate rash, facial/tongue/throat swelling, SOB or lightheadedness with hypotension: Yes Has patient had a PCN reaction causing severe rash involving mucus membranes or skin necrosis: Yes Has patient had a PCN reaction that required hospitalization Yes Has patient had a PCN reaction occurring within the last 10 years: No If all of the above answers are "NO", then may proceed with Cephalosporin use.     Current Outpatient Medications on File Prior to Visit  Medication Sig Dispense Refill  . acetaminophen (TYLENOL) 500 MG tablet Take 500-1,000 mg by mouth daily as needed for headache.     . calcium carbonate (TUMS - DOSED IN MG ELEMENTAL CALCIUM) 500 MG chewable tablet Chew 3-4 tablets by mouth daily as needed for indigestion or heartburn.    . cetirizine (ZYRTEC) 10 MG tablet Take 1 tablet (10 mg total) by mouth daily. 90 tablet 3  . Cholecalciferol (VITAMIN D3) 5000 units CAPS Take 5,000 Units by mouth daily.    Marland Kitchen. docusate sodium (COLACE) 100 MG capsule Take 200-300 mg by mouth daily as needed for mild constipation.    Marland Kitchen. GARLIC PO Take 1 capsule by mouth daily.    Marland Kitchen. ibuprofen (ADVIL,MOTRIN) 600 MG tablet Take 1 tablet (600 mg total) by mouth every 6 (six) hours as needed. 30 tablet 1  . NIFEdipine (PROCARDIA-XL/ADALAT-CC/NIFEDICAL-XL) 30 MG 24 hr tablet Take 1 tablet (30 mg total) by mouth daily. 90 tablet 1  . omeprazole (PRILOSEC) 20 MG capsule Take 1 capsule (20 mg total) by mouth daily. 30 capsule 5  . PE-Shark Liver Oil-Cocoa Buttr (PREP-HEM RE) Place 1 application rectally daily as needed (hemorrhoids).    . Prenatal Vit-Fe Fumarate-FA (MULTIVITAMIN-PRENATAL) 27-0.8 MG TABS tablet Take 1 tablet by mouth daily.     . sertraline (ZOLOFT) 25 MG tablet Take 1 tablet (25 mg total) by mouth daily. 30 tablet 6  . triamcinolone (NASACORT AQ) 55 MCG/ACT AERO nasal inhaler Place 2 sprays into the nose daily. (Patient taking differently: Place 1 spray into the nose daily. ) 1 Inhaler 12   No  current facility-administered medications on file prior to visit.     BP (!) 151/99 (BP Location: Right Arm, Patient Position: Sitting, Cuff Size: Large)   Pulse 99   Temp 98.9 F (37.2 C) (Oral)   Resp 18   Ht 5\' 2"  (1.575 m)   Wt 267 lb (  121.1 kg)   SpO2 100%   BMI 48.83 kg/m       Objective:   Physical Exam  Constitutional: She appears well-developed and well-nourished.  HENT:  Head: Normocephalic and atraumatic.  Right Ear: Tympanic membrane and ear canal normal.  Left Ear: Ear canal normal. Tympanic membrane is erythematous and retracted. Tympanic membrane is not bulging.  Mouth/Throat: Oropharynx is clear and moist. No posterior oropharyngeal edema or posterior oropharyngeal erythema.  3+ tonsils bilaterally  Cardiovascular: Normal rate, regular rhythm and normal heart sounds.  No murmur heard. Pulmonary/Chest: Effort normal and breath sounds normal. No respiratory distress. She has no wheezes.  Psychiatric: She has a normal mood and affect. Her behavior is normal. Judgment and thought content normal.          Assessment & Plan:  Left otitis media/sinusitis- rx with zpak.  Bronchitis- will rx with zpak and prn tessalon.  Pt is advised to call if symptoms worsen or if not improved in 3-4 days.   Hypertension-blood pressure is elevated today.  I repeated her blood pressure manually and got 174/95.  She was on hydrochlorothiazide previously.  This was stopped when she is pregnant.  Will restart hydrochlorothiazide once daily.  Patient is advised to follow-up with PCP in 2 weeks for blood pressure recheck.

## 2018-04-19 ENCOUNTER — Ambulatory Visit (INDEPENDENT_AMBULATORY_CARE_PROVIDER_SITE_OTHER): Payer: Self-pay | Admitting: Family Medicine

## 2018-04-19 ENCOUNTER — Encounter: Payer: Self-pay | Admitting: Family Medicine

## 2018-04-19 VITALS — BP 178/100 | HR 89 | Temp 98.2°F | Resp 16 | Ht 62.0 in | Wt 267.0 lb

## 2018-04-19 DIAGNOSIS — F321 Major depressive disorder, single episode, moderate: Secondary | ICD-10-CM

## 2018-04-19 DIAGNOSIS — E669 Obesity, unspecified: Secondary | ICD-10-CM

## 2018-04-19 DIAGNOSIS — I10 Essential (primary) hypertension: Secondary | ICD-10-CM

## 2018-04-19 HISTORY — DX: Major depressive disorder, single episode, moderate: F32.1

## 2018-04-19 LAB — COMPREHENSIVE METABOLIC PANEL
ALT: 20 U/L (ref 0–35)
AST: 18 U/L (ref 0–37)
Albumin: 4.3 g/dL (ref 3.5–5.2)
Alkaline Phosphatase: 90 U/L (ref 39–117)
BILIRUBIN TOTAL: 0.3 mg/dL (ref 0.2–1.2)
BUN: 15 mg/dL (ref 6–23)
CO2: 27 meq/L (ref 19–32)
Calcium: 9.8 mg/dL (ref 8.4–10.5)
Chloride: 103 mEq/L (ref 96–112)
Creatinine, Ser: 0.68 mg/dL (ref 0.40–1.20)
GFR: 123.19 mL/min (ref >60.00)
GLUCOSE: 86 mg/dL (ref 70–99)
Potassium: 3.7 mEq/L (ref 3.5–5.1)
SODIUM: 140 meq/L (ref 135–145)
TOTAL PROTEIN: 7.1 g/dL (ref 6.0–8.3)

## 2018-04-19 LAB — TSH: TSH: 1.66 u[IU]/mL (ref 0.35–4.50)

## 2018-04-19 LAB — CBC WITH DIFFERENTIAL/PLATELET
BASOS ABS: 0.1 10*3/uL (ref 0.0–0.1)
BASOS PCT: 0.6 % (ref 0.0–3.0)
EOS PCT: 0.8 % (ref 0.0–5.0)
Eosinophils Absolute: 0.1 10*3/uL (ref 0.0–0.7)
HEMATOCRIT: 40.2 % (ref 36.0–46.0)
Hemoglobin: 13.2 g/dL (ref 12.0–15.0)
LYMPHS PCT: 20.6 % (ref 12.0–46.0)
Lymphs Abs: 1.8 10*3/uL (ref 0.7–4.0)
MCHC: 32.7 g/dL (ref 30.0–36.0)
MCV: 80 fl (ref 78.0–100.0)
Monocytes Absolute: 0.4 10*3/uL (ref 0.1–1.0)
Monocytes Relative: 4.8 % (ref 3.0–12.0)
NEUTROS ABS: 6.5 10*3/uL (ref 1.4–7.7)
NEUTROS PCT: 73.2 % (ref 43.0–77.0)
PLATELETS: 342 10*3/uL (ref 150.0–400.0)
RBC: 5.02 Mil/uL (ref 3.87–5.11)
RDW: 13.9 % (ref 11.5–15.5)
WBC: 8.8 10*3/uL (ref 4.0–10.5)

## 2018-04-19 LAB — LIPID PANEL
CHOL/HDL RATIO: 2
Cholesterol: 143 mg/dL (ref 0–200)
HDL: 57.6 mg/dL (ref >39.00)
LDL Cholesterol: 77 mg/dL (ref 0–99)
NONHDL: 85.52
Triglycerides: 44 mg/dL (ref 0.0–149.0)
VLDL: 8.8 mg/dL (ref 0.0–40.0)

## 2018-04-19 MED ORDER — SERTRALINE HCL 50 MG PO TABS: 50.0000 mg | ORAL_TABLET | Freq: Every day | ORAL | 1 refills | Status: DC

## 2018-04-19 MED ORDER — NIFEDIPINE ER OSMOTIC RELEASE 60 MG PO TB24: 60.0000 mg | ORAL_TABLET | Freq: Every day | ORAL | 1 refills | Status: DC

## 2018-04-19 NOTE — Progress Notes (Signed)
Patient ID: Erica Ramos, female    DOB: 1975/05/18  Age: 42 y.o. MRN: [id number]    Subjective:  Subjective  HPI MY RINKE presents for bp f/u.  She has been under a lot of stress.  She has 2 children under 2 and she lost her job.  Her boyfriend helps but she really needs a job and daycare will be expensive.  No cp, sob or headaches.  She started crying and said the gyn started her on zoloft but she does not take it regularly --- she is afraid she will get addicted to it.   No other complaints.    Review of Systems  Constitutional: Negative for appetite change, diaphoresis, fatigue and unexpected weight change.  Eyes: Negative for pain, redness and visual disturbance.  Respiratory: Negative for cough, chest tightness, shortness of breath and wheezing.   Cardiovascular: Negative for chest pain, palpitations and leg swelling.  Endocrine: Negative for cold intolerance, heat intolerance, polydipsia, polyphagia and polyuria.  Genitourinary: Negative for difficulty urinating, dysuria and frequency.  Neurological: Negative for dizziness, light-headedness, numbness and headaches.  Psychiatric/Behavioral: Positive for decreased concentration, dysphoric mood and sleep disturbance. Negative for self-injury and suicidal ideas. The patient is not nervous/anxious.     History Past Medical History:  Diagnosis Date  . Anxiety   . Bronchitis    chronic - has flare every December  . Cervical polyp    hx of  . Environmental allergies   . Genital warts   . GERD (gastroesophageal reflux disease)    Per MD chart note 12/01/2010  . Heart murmur   . Hx of abnormal Pap smear 2012  . Hypertension   . Morbid obesity (HCC)    BMI 47.9  . Persistent headaches    worst during menstrual cycle  . PVC (premature ventricular contraction) 02/20/2015  . STD (sexually transmitted disease)    hx genital warts/?HSV  . Vaginal Pap smear, abnormal     She has a past surgical history that includes Wisdom  tooth extraction; Colposcopy (2012); Cesarean section (N/A, 06/05/2016); and Cesarean section (N/A, 09/20/2017).   Her family history includes Alcohol abuse in her father; Diabetes in her father, maternal aunt, maternal grandmother, maternal uncle, and mother; Heart attack in her paternal grandmother; Hypertension in her maternal grandmother, mother, sister, and another family member; Obesity in an other family member; Seizures in her maternal aunt.She reports that she has never smoked. She has never used smokeless tobacco. She reports current alcohol use of about 1.0 standard drinks of alcohol per week. She reports that she does not use drugs.  Current Outpatient Medications on File Prior to Visit  Medication Sig Dispense Refill  . acetaminophen (TYLENOL) 500 MG tablet Take 500-1,000 mg by mouth daily as needed for headache.     . benzonatate (TESSALON) 100 MG capsule Take 1 capsule (100 mg total) by mouth 3 (three) times daily as needed. 20 capsule 0  . calcium carbonate (TUMS - DOSED IN MG ELEMENTAL CALCIUM) 500 MG chewable tablet Chew 3-4 tablets by mouth daily as needed for indigestion or heartburn.    . cetirizine (ZYRTEC) 10 MG tablet Take 1 tablet (10 mg total) by mouth daily. 90 tablet 3  . Cholecalciferol (VITAMIN D3) 5000 units CAPS Take 5,000 Units by mouth daily.    Marland Kitchen docusate sodium (COLACE) 100 MG capsule Take 200-300 mg by mouth daily as needed for mild constipation.    Marland Kitchen GARLIC PO Take 1 capsule by mouth daily.    Marland Kitchen  hydrochlorothiazide (HYDRODIURIL) 25 MG tablet Take 1 tablet (25 mg total) by mouth daily. 30 tablet 3  . ibuprofen (ADVIL,MOTRIN) 600 MG tablet Take 1 tablet (600 mg total) by mouth every 6 (six) hours as needed. 30 tablet 1  . omeprazole (PRILOSEC) 20 MG capsule Take 1 capsule (20 mg total) by mouth daily. 30 capsule 5  . PE-Shark Liver Oil-Cocoa Buttr (PREP-HEM RE) Place 1 application rectally daily as needed (hemorrhoids).    . Prenatal Vit-Fe Fumarate-FA  (MULTIVITAMIN-PRENATAL) 27-0.8 MG TABS tablet Take 1 tablet by mouth daily.     Marland Kitchen. triamcinolone (NASACORT AQ) 55 MCG/ACT AERO nasal inhaler Place 2 sprays into the nose daily. (Patient taking differently: Place 1 spray into the nose daily. ) 1 Inhaler 12   No current facility-administered medications on file prior to visit.      Objective:  Objective  Physical Exam Constitutional:      Appearance: She is well-developed.  HENT:     Head: Normocephalic and atraumatic.  Eyes:     Conjunctiva/sclera: Conjunctivae normal.  Neck:     Musculoskeletal: Normal range of motion and neck supple.     Thyroid: No thyromegaly.     Vascular: No carotid bruit or JVD.  Cardiovascular:     Rate and Rhythm: Normal rate and regular rhythm.     Heart sounds: Normal heart sounds. No murmur.  Pulmonary:     Effort: Pulmonary effort is normal. No respiratory distress.     Breath sounds: Normal breath sounds. No wheezing or rales.  Chest:     Chest wall: No tenderness.  Neurological:     Mental Status: She is alert and oriented to person, place, and time.  Psychiatric:        Attention and Perception: Attention and perception normal.        Mood and Affect: Mood is depressed. Affect is tearful.        Behavior: Behavior normal.        Thought Content: Thought content normal.        Cognition and Memory: Cognition normal.        Judgment: Judgment normal.    BP (!) 178/100 (BP Location: Left Arm, Cuff Size: Large)   Pulse 89   Temp 98.2 F (36.8 C) (Oral)   Resp 16   Ht 5\' 2"  (1.575 m)   Wt 267 lb (121.1 kg)   LMP 04/03/2018   SpO2 98%   BMI 48.83 kg/m  Wt Readings from Last 3 Encounters:  04/19/18 267 lb (121.1 kg)  04/02/18 267 lb (121.1 kg)  09/20/17 270 lb 9.6 oz (122.7 kg)     Lab Results  Component Value Date   WBC 12.7 (H) 09/21/2017   HGB 10.3 (L) 09/21/2017   HCT 31.2 (L) 09/21/2017   PLT 212 09/21/2017   GLUCOSE 92 09/20/2017   CHOL 146 07/01/2016   TRIG 49 07/01/2016     HDL 65 07/01/2016   LDLCALC 71 07/01/2016   ALT 15 09/20/2017   AST 19 09/20/2017   NA 137 09/20/2017   K 4.0 09/20/2017   CL 108 09/20/2017   CREATININE 0.59 09/20/2017   BUN 6 09/20/2017   CO2 18 (L) 09/20/2017   TSH 2.342 02/06/2015   HGBA1C 5.7 10/30/2014   MICROALBUR <0.7 10/30/2014    No results found.   Assessment & Plan:  Plan  I have discontinued Excell SeltzerChandra D. Griep's NIFEdipine, sertraline, and azithromycin. I have also changed her sertraline. Additionally, I am having  her start on NIFEdipine. Lastly, I am having her maintain her cetirizine, omeprazole, multivitamin-prenatal, triamcinolone, acetaminophen, Vitamin D3, GARLIC PO, calcium carbonate, docusate sodium, PE-Shark Liver Oil-Cocoa Buttr (PREP-HEM RE), ibuprofen, benzonatate, and hydrochlorothiazide.  Meds ordered this encounter  Medications  . sertraline (ZOLOFT) 50 MG tablet    Sig: Take 1 tablet (50 mg total) by mouth daily.    Dispense:  90 tablet    Refill:  1  . NIFEdipine (PROCARDIA XL) 60 MG 24 hr tablet    Sig: Take 1 tablet (60 mg total) by mouth daily.    Dispense:  90 tablet    Refill:  1    Problem List Items Addressed This Visit      Unprioritized   Depression, major, single episode, moderate (HCC) - Primary    Post partum depression  Restart zoloft 50 mg daily       Relevant Medications   sertraline (ZOLOFT) 50 MG tablet   Other Relevant Orders   CBC with Differential/Platelet   Lipid panel   Comprehensive metabolic panel   TSH   HTN (hypertension)    Well controlled, no changes to meds. Encouraged heart healthy diet such as the DASH diet and exercise as tolerated.  Procardia xl 60 mg daily      Relevant Medications   NIFEdipine (PROCARDIA XL) 60 MG 24 hr tablet   Other Relevant Orders   CBC with Differential/Platelet   Lipid panel   Comprehensive metabolic panel   TSH   Obesity (BMI 30-39.9)   Relevant Orders   Amb Ref to Medical Weight Management   Obesity, morbid (HCC)     Referred to healthy weight and wellness         Follow-up: Return in about 4 weeks (around 05/17/2018), or if symptoms worsen or fail to improve.  Donato SchultzYvonne R Lowne Chase, DO

## 2018-04-19 NOTE — Assessment & Plan Note (Signed)
Post partum depression  Restart zoloft 50 mg daily

## 2018-04-19 NOTE — Assessment & Plan Note (Signed)
Referred to healthy weight and wellness 

## 2018-04-19 NOTE — Assessment & Plan Note (Signed)
Well controlled, no changes to meds. Encouraged heart healthy diet such as the DASH diet and exercise as tolerated.  Procardia xl 60 mg daily

## 2018-04-19 NOTE — Patient Instructions (Signed)
DASH Eating Plan  DASH stands for "Dietary Approaches to Stop Hypertension." The DASH eating plan is a healthy eating plan that has been shown to reduce high blood pressure (hypertension). It may also reduce your risk for type 2 diabetes, heart disease, and stroke. The DASH eating plan may also help with weight loss.  What are tips for following this plan?    General guidelines   Avoid eating more than 2,300 mg (milligrams) of salt (sodium) a day. If you have hypertension, you may need to reduce your sodium intake to 1,500 mg a day.   Limit alcohol intake to no more than 1 drink a day for nonpregnant women and 2 drinks a day for men. One drink equals 12 oz of beer, 5 oz of wine, or 1 oz of hard liquor.   Work with your health care provider to maintain a healthy body weight or to lose weight. Ask what an ideal weight is for you.   Get at least 30 minutes of exercise that causes your heart to beat faster (aerobic exercise) most days of the week. Activities may include walking, swimming, or biking.   Work with your health care provider or diet and nutrition specialist (dietitian) to adjust your eating plan to your individual calorie needs.  Reading food labels     Check food labels for the amount of sodium per serving. Choose foods with less than 5 percent of the Daily Value of sodium. Generally, foods with less than 300 mg of sodium per serving fit into this eating plan.   To find whole grains, look for the word "whole" as the first word in the ingredient list.  Shopping   Buy products labeled as "low-sodium" or "no salt added."   Buy fresh foods. Avoid canned foods and premade or frozen meals.  Cooking   Avoid adding salt when cooking. Use salt-free seasonings or herbs instead of table salt or sea salt. Check with your health care provider or pharmacist before using salt substitutes.   Do not fry foods. Cook foods using healthy methods such as baking, boiling, grilling, and broiling instead.   Cook with  heart-healthy oils, such as olive, canola, soybean, or sunflower oil.  Meal planning   Eat a balanced diet that includes:  ? 5 or more servings of fruits and vegetables each day. At each meal, try to fill half of your plate with fruits and vegetables.  ? Up to 6-8 servings of whole grains each day.  ? Less than 6 oz of lean meat, poultry, or fish each day. A 3-oz serving of meat is about the same size as a deck of cards. One egg equals 1 oz.  ? 2 servings of low-fat dairy each day.  ? A serving of nuts, seeds, or beans 5 times each week.  ? Heart-healthy fats. Healthy fats called Omega-3 fatty acids are found in foods such as flaxseeds and coldwater fish, like sardines, salmon, and mackerel.   Limit how much you eat of the following:  ? Canned or prepackaged foods.  ? Food that is high in trans fat, such as fried foods.  ? Food that is high in saturated fat, such as fatty meat.  ? Sweets, desserts, sugary drinks, and other foods with added sugar.  ? Full-fat dairy products.   Do not salt foods before eating.   Try to eat at least 2 vegetarian meals each week.   Eat more home-cooked food and less restaurant, buffet, and fast food.     When eating at a restaurant, ask that your food be prepared with less salt or no salt, if possible.  What foods are recommended?  The items listed may not be a complete list. Talk with your dietitian about what dietary choices are best for you.  Grains  Whole-grain or whole-wheat bread. Whole-grain or whole-wheat pasta. Brown rice. Oatmeal. Quinoa. Bulgur. Whole-grain and low-sodium cereals. Pita bread. Low-fat, low-sodium crackers. Whole-wheat flour tortillas.  Vegetables  Fresh or frozen vegetables (raw, steamed, roasted, or grilled). Low-sodium or reduced-sodium tomato and vegetable juice. Low-sodium or reduced-sodium tomato sauce and tomato paste. Low-sodium or reduced-sodium canned vegetables.  Fruits  All fresh, dried, or frozen fruit. Canned fruit in natural juice (without  added sugar).  Meat and other protein foods  Skinless chicken or turkey. Ground chicken or turkey. Pork with fat trimmed off. Fish and seafood. Egg whites. Dried beans, peas, or lentils. Unsalted nuts, nut butters, and seeds. Unsalted canned beans. Lean cuts of beef with fat trimmed off. Low-sodium, lean deli meat.  Dairy  Low-fat (1%) or fat-free (skim) milk. Fat-free, low-fat, or reduced-fat cheeses. Nonfat, low-sodium ricotta or cottage cheese. Low-fat or nonfat yogurt. Low-fat, low-sodium cheese.  Fats and oils  Soft margarine without trans fats. Vegetable oil. Low-fat, reduced-fat, or light mayonnaise and salad dressings (reduced-sodium). Canola, safflower, olive, soybean, and sunflower oils. Avocado.  Seasoning and other foods  Herbs. Spices. Seasoning mixes without salt. Unsalted popcorn and pretzels. Fat-free sweets.  What foods are not recommended?  The items listed may not be a complete list. Talk with your dietitian about what dietary choices are best for you.  Grains  Baked goods made with fat, such as croissants, muffins, or some breads. Dry pasta or rice meal packs.  Vegetables  Creamed or fried vegetables. Vegetables in a cheese sauce. Regular canned vegetables (not low-sodium or reduced-sodium). Regular canned tomato sauce and paste (not low-sodium or reduced-sodium). Regular tomato and vegetable juice (not low-sodium or reduced-sodium). Pickles. Olives.  Fruits  Canned fruit in a light or heavy syrup. Fried fruit. Fruit in cream or butter sauce.  Meat and other protein foods  Fatty cuts of meat. Ribs. Fried meat. Bacon. Sausage. Bologna and other processed lunch meats. Salami. Fatback. Hotdogs. Bratwurst. Salted nuts and seeds. Canned beans with added salt. Canned or smoked fish. Whole eggs or egg yolks. Chicken or turkey with skin.  Dairy  Whole or 2% milk, cream, and half-and-half. Whole or full-fat cream cheese. Whole-fat or sweetened yogurt. Full-fat cheese. Nondairy creamers. Whipped toppings.  Processed cheese and cheese spreads.  Fats and oils  Butter. Stick margarine. Lard. Shortening. Ghee. Bacon fat. Tropical oils, such as coconut, palm kernel, or palm oil.  Seasoning and other foods  Salted popcorn and pretzels. Onion salt, garlic salt, seasoned salt, table salt, and sea salt. Worcestershire sauce. Tartar sauce. Barbecue sauce. Teriyaki sauce. Soy sauce, including reduced-sodium. Steak sauce. Canned and packaged gravies. Fish sauce. Oyster sauce. Cocktail sauce. Horseradish that you find on the shelf. Ketchup. Mustard. Meat flavorings and tenderizers. Bouillon cubes. Hot sauce and Tabasco sauce. Premade or packaged marinades. Premade or packaged taco seasonings. Relishes. Regular salad dressings.  Where to find more information:   National Heart, Lung, and Blood Institute: www.nhlbi.nih.gov   American Heart Association: www.heart.org  Summary   The DASH eating plan is a healthy eating plan that has been shown to reduce high blood pressure (hypertension). It may also reduce your risk for type 2 diabetes, heart disease, and stroke.   With the   DASH eating plan, you should limit salt (sodium) intake to 2,300 mg a day. If you have hypertension, you may need to reduce your sodium intake to 1,500 mg a day.   When on the DASH eating plan, aim to eat more fresh fruits and vegetables, whole grains, lean proteins, low-fat dairy, and heart-healthy fats.   Work with your health care provider or diet and nutrition specialist (dietitian) to adjust your eating plan to your individual calorie needs.  This information is not intended to replace advice given to you by your health care provider. Make sure you discuss any questions you have with your health care provider.  Document Released: 04/07/2011 Document Revised: 04/11/2016 Document Reviewed: 04/11/2016  [employer] Interactive Patient Education  2019 [employer]

## 2018-04-24 ENCOUNTER — Encounter: Payer: Self-pay | Admitting: *Deleted

## 2018-05-14 ENCOUNTER — Ambulatory Visit: Payer: Self-pay | Admitting: Family Medicine

## 2018-05-14 ENCOUNTER — Encounter: Payer: Self-pay | Admitting: Family Medicine

## 2018-05-14 VITALS — BP 180/96 | HR 83 | Temp 98.3°F | Resp 16 | Ht 62.0 in | Wt 263.2 lb

## 2018-05-14 DIAGNOSIS — I10 Essential (primary) hypertension: Secondary | ICD-10-CM

## 2018-05-14 DIAGNOSIS — K219 Gastro-esophageal reflux disease without esophagitis: Secondary | ICD-10-CM | POA: Insufficient documentation

## 2018-05-14 MED ORDER — METOPROLOL SUCCINATE ER 50 MG PO TB24: 50.0000 mg | ORAL_TABLET | Freq: Every day | ORAL | 2 refills | Status: DC

## 2018-05-14 MED ORDER — HYDROCHLOROTHIAZIDE 25 MG PO TABS: 25.0000 mg | ORAL_TABLET | Freq: Every day | ORAL | 3 refills | Status: DC

## 2018-05-14 NOTE — Progress Notes (Signed)
Patient ID: Erica Ramos, female    DOB: [birth year]  Age: 43 y.o. MRN: [id number]    Subjective:  Subjective  HPI Erica Ramos presents for f/u bp.  No other complaints.  She never took the prilosec because she did not want to take meds and she was concerned because the the zantac recall.  No other complaints    Review of Systems  Constitutional: Negative for chills and fever.  HENT: Negative for congestion and hearing loss.   Eyes: Negative for discharge.  Respiratory: Negative for cough and shortness of breath.   Cardiovascular: Negative for chest pain, palpitations and leg swelling.  Gastrointestinal: Negative for abdominal pain, blood in stool, constipation, diarrhea, nausea and vomiting.  Genitourinary: Negative for dysuria, frequency, hematuria and urgency.  Musculoskeletal: Negative for back pain and myalgias.  Skin: Negative for rash.  Allergic/Immunologic: Negative for environmental allergies.  Neurological: Negative for dizziness, weakness and headaches.  Hematological: Does not bruise/bleed easily.  Psychiatric/Behavioral: Negative for suicidal ideas. The patient is not nervous/anxious.     History Past Medical History:  Diagnosis Date  . Anxiety   . Bronchitis    chronic - has flare every December  . Cervical polyp    hx of  . Environmental allergies   . Genital warts   . GERD (gastroesophageal reflux disease)    Per MD chart note 12/01/2010  . Heart murmur   . Hx of abnormal Pap smear 2012  . Hypertension   . Morbid obesity (HCC)    BMI 47.9  . Persistent headaches    worst during menstrual cycle  . PVC (premature ventricular contraction) 02/20/2015  . STD (sexually transmitted disease)    hx genital warts/?HSV  . Vaginal Pap smear, abnormal     She has a past surgical history that includes Wisdom tooth extraction; Colposcopy (2012); Cesarean section (N/A, 06/05/2016); and Cesarean section (N/A, 09/20/2017).   Her family history includes Alcohol abuse  in her father; Diabetes in her father, maternal aunt, maternal grandmother, maternal uncle, and mother; Heart attack in her paternal grandmother; Hypertension in her maternal grandmother, mother, sister, and another family member; Obesity in an other family member; Seizures in her maternal aunt.She reports that she has never smoked. She has never used smokeless tobacco. She reports current alcohol use of about 1.0 standard drinks of alcohol per week. She reports that she does not use drugs.  Current Outpatient Medications on File Prior to Visit  Medication Sig Dispense Refill  . acetaminophen (TYLENOL) 500 MG tablet Take 500-1,000 mg by mouth daily as needed for headache.     . benzonatate (TESSALON) 100 MG capsule Take 1 capsule (100 mg total) by mouth 3 (three) times daily as needed. 20 capsule 0  . calcium carbonate (TUMS - DOSED IN MG ELEMENTAL CALCIUM) 500 MG chewable tablet Chew 3-4 tablets by mouth daily as needed for indigestion or heartburn.    . cetirizine (ZYRTEC) 10 MG tablet Take 1 tablet (10 mg total) by mouth daily. 90 tablet 3  . Cholecalciferol (VITAMIN D3) 5000 units CAPS Take 5,000 Units by mouth daily.    Marland Kitchen. docusate sodium (COLACE) 100 MG capsule Take 200-300 mg by mouth daily as needed for mild constipation.    Marland Kitchen. GARLIC PO Take 1 capsule by mouth daily.    Marland Kitchen. ibuprofen (ADVIL,MOTRIN) 600 MG tablet Take 1 tablet (600 mg total) by mouth every 6 (six) hours as needed. 30 tablet 1  . NIFEdipine (PROCARDIA XL) 60 MG 24 hr  tablet Take 1 tablet (60 mg total) by mouth daily. 90 tablet 1  . PE-Shark Liver Oil-Cocoa Buttr (PREP-HEM RE) Place 1 application rectally daily as needed (hemorrhoids).    . Prenatal Vit-Fe Fumarate-FA (MULTIVITAMIN-PRENATAL) 27-0.8 MG TABS tablet Take 1 tablet by mouth daily.     . sertraline (ZOLOFT) 50 MG tablet Take 1 tablet (50 mg total) by mouth daily. 90 tablet 1  . triamcinolone (NASACORT AQ) 55 MCG/ACT AERO nasal inhaler Place 2 sprays into the nose daily.  (Patient taking differently: Place 1 spray into the nose daily. ) 1 Inhaler 12  . omeprazole (PRILOSEC) 20 MG capsule Take 1 capsule (20 mg total) by mouth daily. (Patient not taking: Reported on 05/14/2018) 30 capsule 5   No current facility-administered medications on file prior to visit.      Objective:  Objective  Physical Exam Vitals signs and nursing note reviewed.  Constitutional:      Appearance: She is well-developed.  HENT:     Head: Normocephalic and atraumatic.  Eyes:     Conjunctiva/sclera: Conjunctivae normal.  Neck:     Musculoskeletal: Normal range of motion and neck supple.     Thyroid: No thyromegaly.     Vascular: No carotid bruit or JVD.  Cardiovascular:     Rate and Rhythm: Normal rate and regular rhythm.     Heart sounds: Normal heart sounds. No murmur.  Pulmonary:     Effort: Pulmonary effort is normal. No respiratory distress.     Breath sounds: Normal breath sounds. No wheezing or rales.  Chest:     Chest wall: No tenderness.  Neurological:     Mental Status: She is alert and oriented to person, place, and time.    BP (!) 180/96 (BP Location: Left Arm, Cuff Size: Large)   Pulse 83   Temp 98.3 F (36.8 C) (Oral)   Resp 16   Ht 5\' 2"  (1.575 m)   Wt 263 lb 3.2 oz (119.4 kg)   LMP 05/14/2018   SpO2 98%   BMI 48.14 kg/m  Wt Readings from Last 3 Encounters:  05/14/18 263 lb 3.2 oz (119.4 kg)  04/19/18 267 lb (121.1 kg)  04/02/18 267 lb (121.1 kg)     Lab Results  Component Value Date   WBC 8.8 04/19/2018   HGB 13.2 04/19/2018   HCT 40.2 04/19/2018   PLT 342.0 04/19/2018   GLUCOSE 86 04/19/2018   CHOL 143 04/19/2018   TRIG 44.0 04/19/2018   HDL 57.60 04/19/2018   LDLCALC 77 04/19/2018   ALT 20 04/19/2018   AST 18 04/19/2018   NA 140 04/19/2018   K 3.7 04/19/2018   CL 103 04/19/2018   CREATININE 0.68 04/19/2018   BUN 15 04/19/2018   CO2 27 04/19/2018   TSH 1.66 04/19/2018   HGBA1C 5.7 10/30/2014   MICROALBUR <0.7 10/30/2014     No results found.   Assessment & Plan:  Plan  I am having Erica Ramos start on metoprolol succinate. I am also having her maintain her cetirizine, omeprazole, multivitamin-prenatal, triamcinolone, acetaminophen, Vitamin D3, GARLIC PO, calcium carbonate, docusate sodium, PE-Shark Liver Oil-Cocoa Buttr (PREP-HEM RE), ibuprofen, benzonatate, sertraline, NIFEdipine, and hydrochlorothiazide.  Meds ordered this encounter  Medications  . hydrochlorothiazide (HYDRODIURIL) 25 MG tablet    Sig: Take 1 tablet (25 mg total) by mouth daily.    Dispense:  30 tablet    Refill:  3  . metoprolol succinate (TOPROL XL) 50 MG 24 hr tablet  Sig: Take 1 tablet (50 mg total) by mouth daily. Take with or immediately following a meal.    Dispense:  30 tablet    Refill:  2    Problem List Items Addressed This Visit      Unprioritized   GERD (gastroesophageal reflux disease)    Omeprazole d/c due to pt not taking it Take pepcid otc daily rto prn       HTN (hypertension) - Primary    Add toprol 50 mg daily May triy to wean of procardia rto 2-3 weeks  con't hctz      Relevant Medications   hydrochlorothiazide (HYDRODIURIL) 25 MG tablet   metoprolol succinate (TOPROL XL) 50 MG 24 hr tablet      Follow-up: Return if symptoms worsen or fail to improve, for as scheduled .  Donato Schultz, DO

## 2018-05-14 NOTE — Assessment & Plan Note (Signed)
Add toprol 50 mg daily May triy to wean of procardia rto 2-3 weeks  con't hctz

## 2018-05-14 NOTE — Assessment & Plan Note (Signed)
Omeprazole d/c due to pt not taking it Take pepcid otc daily rto prn

## 2018-05-14 NOTE — Patient Instructions (Signed)
DASH Eating Plan  DASH stands for "Dietary Approaches to Stop Hypertension." The DASH eating plan is a healthy eating plan that has been shown to reduce high blood pressure (hypertension). It may also reduce your risk for type 2 diabetes, heart disease, and stroke. The DASH eating plan may also help with weight loss.  What are tips for following this plan?    General guidelines   Avoid eating more than 2,300 mg (milligrams) of salt (sodium) a day. If you have hypertension, you may need to reduce your sodium intake to 1,500 mg a day.   Limit alcohol intake to no more than 1 drink a day for nonpregnant women and 2 drinks a day for men. One drink equals 12 oz of beer, 5 oz of wine, or 1 oz of hard liquor.   Work with your health care provider to maintain a healthy body weight or to lose weight. Ask what an ideal weight is for you.   Get at least 30 minutes of exercise that causes your heart to beat faster (aerobic exercise) most days of the week. Activities may include walking, swimming, or biking.   Work with your health care provider or diet and nutrition specialist (dietitian) to adjust your eating plan to your individual calorie needs.  Reading food labels     Check food labels for the amount of sodium per serving. Choose foods with less than 5 percent of the Daily Value of sodium. Generally, foods with less than 300 mg of sodium per serving fit into this eating plan.   To find whole grains, look for the word "whole" as the first word in the ingredient list.  Shopping   Buy products labeled as "low-sodium" or "no salt added."   Buy fresh foods. Avoid canned foods and premade or frozen meals.  Cooking   Avoid adding salt when cooking. Use salt-free seasonings or herbs instead of table salt or sea salt. Check with your health care provider or pharmacist before using salt substitutes.   Do not fry foods. Cook foods using healthy methods such as baking, boiling, grilling, and broiling instead.   Cook with  heart-healthy oils, such as olive, canola, soybean, or sunflower oil.  Meal planning   Eat a balanced diet that includes:  ? 5 or more servings of fruits and vegetables each day. At each meal, try to fill half of your plate with fruits and vegetables.  ? Up to 6-8 servings of whole grains each day.  ? Less than 6 oz of lean meat, poultry, or fish each day. A 3-oz serving of meat is about the same size as a deck of cards. One egg equals 1 oz.  ? 2 servings of low-fat dairy each day.  ? A serving of nuts, seeds, or beans 5 times each week.  ? Heart-healthy fats. Healthy fats called Omega-3 fatty acids are found in foods such as flaxseeds and coldwater fish, like sardines, salmon, and mackerel.   Limit how much you eat of the following:  ? Canned or prepackaged foods.  ? Food that is high in trans fat, such as fried foods.  ? Food that is high in saturated fat, such as fatty meat.  ? Sweets, desserts, sugary drinks, and other foods with added sugar.  ? Full-fat dairy products.   Do not salt foods before eating.   Try to eat at least 2 vegetarian meals each week.   Eat more home-cooked food and less restaurant, buffet, and fast food.     When eating at a restaurant, ask that your food be prepared with less salt or no salt, if possible.  What foods are recommended?  The items listed may not be a complete list. Talk with your dietitian about what dietary choices are best for you.  Grains  Whole-grain or whole-wheat bread. Whole-grain or whole-wheat pasta. Brown rice. Oatmeal. Quinoa. Bulgur. Whole-grain and low-sodium cereals. Pita bread. Low-fat, low-sodium crackers. Whole-wheat flour tortillas.  Vegetables  Fresh or frozen vegetables (raw, steamed, roasted, or grilled). Low-sodium or reduced-sodium tomato and vegetable juice. Low-sodium or reduced-sodium tomato sauce and tomato paste. Low-sodium or reduced-sodium canned vegetables.  Fruits  All fresh, dried, or frozen fruit. Canned fruit in natural juice (without  added sugar).  Meat and other protein foods  Skinless chicken or turkey. Ground chicken or turkey. Pork with fat trimmed off. Fish and seafood. Egg whites. Dried beans, peas, or lentils. Unsalted nuts, nut butters, and seeds. Unsalted canned beans. Lean cuts of beef with fat trimmed off. Low-sodium, lean deli meat.  Dairy  Low-fat (1%) or fat-free (skim) milk. Fat-free, low-fat, or reduced-fat cheeses. Nonfat, low-sodium ricotta or cottage cheese. Low-fat or nonfat yogurt. Low-fat, low-sodium cheese.  Fats and oils  Soft margarine without trans fats. Vegetable oil. Low-fat, reduced-fat, or light mayonnaise and salad dressings (reduced-sodium). Canola, safflower, olive, soybean, and sunflower oils. Avocado.  Seasoning and other foods  Herbs. Spices. Seasoning mixes without salt. Unsalted popcorn and pretzels. Fat-free sweets.  What foods are not recommended?  The items listed may not be a complete list. Talk with your dietitian about what dietary choices are best for you.  Grains  Baked goods made with fat, such as croissants, muffins, or some breads. Dry pasta or rice meal packs.  Vegetables  Creamed or fried vegetables. Vegetables in a cheese sauce. Regular canned vegetables (not low-sodium or reduced-sodium). Regular canned tomato sauce and paste (not low-sodium or reduced-sodium). Regular tomato and vegetable juice (not low-sodium or reduced-sodium). Pickles. Olives.  Fruits  Canned fruit in a light or heavy syrup. Fried fruit. Fruit in cream or butter sauce.  Meat and other protein foods  Fatty cuts of meat. Ribs. Fried meat. Bacon. Sausage. Bologna and other processed lunch meats. Salami. Fatback. Hotdogs. Bratwurst. Salted nuts and seeds. Canned beans with added salt. Canned or smoked fish. Whole eggs or egg yolks. Chicken or turkey with skin.  Dairy  Whole or 2% milk, cream, and half-and-half. Whole or full-fat cream cheese. Whole-fat or sweetened yogurt. Full-fat cheese. Nondairy creamers. Whipped toppings.  Processed cheese and cheese spreads.  Fats and oils  Butter. Stick margarine. Lard. Shortening. Ghee. Bacon fat. Tropical oils, such as coconut, palm kernel, or palm oil.  Seasoning and other foods  Salted popcorn and pretzels. Onion salt, garlic salt, seasoned salt, table salt, and sea salt. Worcestershire sauce. Tartar sauce. Barbecue sauce. Teriyaki sauce. Soy sauce, including reduced-sodium. Steak sauce. Canned and packaged gravies. Fish sauce. Oyster sauce. Cocktail sauce. Horseradish that you find on the shelf. Ketchup. Mustard. Meat flavorings and tenderizers. Bouillon cubes. Hot sauce and Tabasco sauce. Premade or packaged marinades. Premade or packaged taco seasonings. Relishes. Regular salad dressings.  Where to find more information:   National Heart, Lung, and Blood Institute: www.nhlbi.nih.gov   American Heart Association: www.heart.org  Summary   The DASH eating plan is a healthy eating plan that has been shown to reduce high blood pressure (hypertension). It may also reduce your risk for type 2 diabetes, heart disease, and stroke.   With the   DASH eating plan, you should limit salt (sodium) intake to 2,300 mg a day. If you have hypertension, you may need to reduce your sodium intake to 1,500 mg a day.   When on the DASH eating plan, aim to eat more fresh fruits and vegetables, whole grains, lean proteins, low-fat dairy, and heart-healthy fats.   Work with your health care provider or diet and nutrition specialist (dietitian) to adjust your eating plan to your individual calorie needs.  This information is not intended to replace advice given to you by your health care provider. Make sure you discuss any questions you have with your health care provider.  Document Released: 04/07/2011 Document Revised: 04/11/2016 Document Reviewed: 04/11/2016  [employer] Interactive Patient Education  2019 [employer]

## 2018-06-29 ENCOUNTER — Encounter: Payer: Self-pay | Admitting: Family Medicine

## 2018-07-03 ENCOUNTER — Ambulatory Visit: Payer: Self-pay | Admitting: Family Medicine

## 2018-07-05 ENCOUNTER — Encounter: Payer: Self-pay | Admitting: Family Medicine

## 2018-07-05 ENCOUNTER — Ambulatory Visit (INDEPENDENT_AMBULATORY_CARE_PROVIDER_SITE_OTHER): Payer: Self-pay | Admitting: Family Medicine

## 2018-07-05 ENCOUNTER — Telehealth: Payer: Self-pay | Admitting: Family Medicine

## 2018-07-05 VITALS — BP 152/88 | HR 90 | Ht 62.0 in | Wt 266.0 lb

## 2018-07-05 DIAGNOSIS — I10 Essential (primary) hypertension: Secondary | ICD-10-CM

## 2018-07-05 MED ORDER — METOPROLOL SUCCINATE ER 100 MG PO TB24: 100.0000 mg | ORAL_TABLET | Freq: Every day | ORAL | 3 refills | Status: DC

## 2018-07-05 NOTE — Telephone Encounter (Signed)
Copied from CRM [id number]. Topic: Quick Communication - See Telephone Encounter >> Jul 05, 2018  2:47 PM Fanny Bien wrote: CRM for notification. See Telephone encounter for: 07/05/18. Pt called and stated that she would like to get her first mammogram done. Please advise

## 2018-07-05 NOTE — Progress Notes (Signed)
Patient ID: Erica Ramos, female    DOB: 07-24-1975  Age: 43 y.o. MRN: [id number]    Subjective:  Subjective  HPI Erica Ramos presents for bp check  No other complaints.  She has gotten a job with benefits.   Review of Systems  Constitutional: Negative for appetite change, diaphoresis, fatigue and unexpected weight change.  Eyes: Negative for pain, redness and visual disturbance.  Respiratory: Negative for cough, chest tightness, shortness of breath and wheezing.   Cardiovascular: Negative for chest pain, palpitations and leg swelling.  Endocrine: Negative for cold intolerance, heat intolerance, polydipsia, polyphagia and polyuria.  Genitourinary: Negative for difficulty urinating, dysuria and frequency.  Neurological: Negative for dizziness, light-headedness, numbness and headaches.    History Past Medical History:  Diagnosis Date  . Anxiety   . Bronchitis    chronic - has flare every December  . Cervical polyp    hx of  . Environmental allergies   . Genital warts   . GERD (gastroesophageal reflux disease)    Per MD chart note 12/01/2010  . Heart murmur   . Hx of abnormal Pap smear 2012  . Hypertension   . Morbid obesity (HCC)    BMI 47.9  . Persistent headaches    worst during menstrual cycle  . PVC (premature ventricular contraction) 02/20/2015  . STD (sexually transmitted disease)    hx genital warts/?HSV  . Vaginal Pap smear, abnormal     She has a past surgical history that includes Wisdom tooth extraction; Colposcopy (2012); Cesarean section (N/A, 06/05/2016); and Cesarean section (N/A, 09/20/2017).   Her family history includes Alcohol abuse in her father; Diabetes in her father, maternal aunt, maternal grandmother, maternal uncle, and mother; Heart attack in her paternal grandmother; Hypertension in her maternal grandmother, mother, sister, and another family member; Obesity in an other family member; Seizures in her maternal aunt.She reports that she has never  smoked. She has never used smokeless tobacco. She reports current alcohol use of about 1.0 standard drinks of alcohol per week. She reports that she does not use drugs.  Current Outpatient Medications on File Prior to Visit  Medication Sig Dispense Refill  . acetaminophen (TYLENOL) 500 MG tablet Take 500-1,000 mg by mouth daily as needed for headache.     . benzonatate (TESSALON) 100 MG capsule Take 1 capsule (100 mg total) by mouth 3 (three) times daily as needed. 20 capsule 0  . calcium carbonate (TUMS - DOSED IN MG ELEMENTAL CALCIUM) 500 MG chewable tablet Chew 3-4 tablets by mouth daily as needed for indigestion or heartburn.    . cetirizine (ZYRTEC) 10 MG tablet Take 1 tablet (10 mg total) by mouth daily. 90 tablet 3  . Cholecalciferol (VITAMIN D3) 5000 units CAPS Take 5,000 Units by mouth daily.    Marland Kitchen docusate sodium (COLACE) 100 MG capsule Take 200-300 mg by mouth daily as needed for mild constipation.    Marland Kitchen GARLIC PO Take 1 capsule by mouth daily.    . hydrochlorothiazide (HYDRODIURIL) 25 MG tablet Take 1 tablet (25 mg total) by mouth daily. 30 tablet 3  . ibuprofen (ADVIL,MOTRIN) 600 MG tablet Take 1 tablet (600 mg total) by mouth every 6 (six) hours as needed. 30 tablet 1  . NIFEdipine (PROCARDIA XL) 60 MG 24 hr tablet Take 1 tablet (60 mg total) by mouth daily. 90 tablet 1  . omeprazole (PRILOSEC) 20 MG capsule Take 1 capsule (20 mg total) by mouth daily. 30 capsule 5  . PE-Shark Liver  Oil-Cocoa Buttr (PREP-HEM RE) Place 1 application rectally daily as needed (hemorrhoids).    . Prenatal Vit-Fe Fumarate-FA (MULTIVITAMIN-PRENATAL) 27-0.8 MG TABS tablet Take 1 tablet by mouth daily.     . sertraline (ZOLOFT) 50 MG tablet Take 1 tablet (50 mg total) by mouth daily. 90 tablet 1  . triamcinolone (NASACORT AQ) 55 MCG/ACT AERO nasal inhaler Place 2 sprays into the nose daily. (Patient taking differently: Place 1 spray into the nose daily. ) 1 Inhaler 12   No current facility-administered  medications on file prior to visit.      Objective:  Objective  Physical Exam Vitals signs and nursing note reviewed.  Constitutional:      Appearance: She is well-developed.  HENT:     Head: Normocephalic and atraumatic.  Eyes:     Conjunctiva/sclera: Conjunctivae normal.  Neck:     Musculoskeletal: Normal range of motion and neck supple.     Thyroid: No thyromegaly.     Vascular: No carotid bruit or JVD.  Cardiovascular:     Rate and Rhythm: Normal rate and regular rhythm.     Heart sounds: Normal heart sounds. No murmur.  Pulmonary:     Effort: Pulmonary effort is normal. No respiratory distress.     Breath sounds: Normal breath sounds. No wheezing or rales.  Chest:     Chest wall: No tenderness.  Neurological:     Mental Status: She is alert and oriented to person, place, and time.    BP (!) 152/88   Pulse 90   Ht 5\' 2"  (1.575 m)   Wt 266 lb (120.7 kg)   SpO2 97%   BMI 48.65 kg/m  Wt Readings from Last 3 Encounters:  07/05/18 266 lb (120.7 kg)  05/14/18 263 lb 3.2 oz (119.4 kg)  04/19/18 267 lb (121.1 kg)     Lab Results  Component Value Date   WBC 8.8 04/19/2018   HGB 13.2 04/19/2018   HCT 40.2 04/19/2018   PLT 342.0 04/19/2018   GLUCOSE 86 04/19/2018   CHOL 143 04/19/2018   TRIG 44.0 04/19/2018   HDL 57.60 04/19/2018   LDLCALC 77 04/19/2018   ALT 20 04/19/2018   AST 18 04/19/2018   NA 140 04/19/2018   K 3.7 04/19/2018   CL 103 04/19/2018   CREATININE 0.68 04/19/2018   BUN 15 04/19/2018   CO2 27 04/19/2018   TSH 1.66 04/19/2018   HGBA1C 5.7 10/30/2014   MICROALBUR <0.7 10/30/2014    No results found.   Assessment & Plan:  Plan  I have discontinued Erica Ramos. Erica Ramos's metoprolol succinate. I am also having her start on metoprolol succinate. Additionally, I am having her maintain her cetirizine, omeprazole, multivitamin-prenatal, triamcinolone, acetaminophen, Vitamin D3, GARLIC PO, calcium carbonate, docusate sodium, PE-Shark Liver Oil-Cocoa  Buttr (PREP-HEM RE), ibuprofen, benzonatate, sertraline, NIFEdipine, and hydrochlorothiazide.  Meds ordered this encounter  Medications  . metoprolol succinate (TOPROL-XL) 100 MG 24 hr tablet    Sig: Take 1 tablet (100 mg total) by mouth daily. Take with or immediately following a meal.    Dispense:  90 tablet    Refill:  3    Problem List Items Addressed This Visit      Unprioritized   HTN (hypertension) - Primary   Relevant Medications   metoprolol succinate (TOPROL-XL) 100 MG 24 hr tablet    Poorly controlled will alter medications, encouraged DASH diet, minimize caffeine and obtain adequate sleep. Report concerning symptoms and follow up as directed and as needed  Cont cardizem for now  Follow-up: Return in about 3 months (around 10/05/2018), or if symptoms worsen or fail to improve, for hypertension.  Donato Schultz, DO

## 2018-07-05 NOTE — Patient Instructions (Signed)
DASH Eating Plan  DASH stands for "Dietary Approaches to Stop Hypertension." The DASH eating plan is a healthy eating plan that has been shown to reduce high blood pressure (hypertension). It may also reduce your risk for type 2 diabetes, heart disease, and stroke. The DASH eating plan may also help with weight loss.  What are tips for following this plan?    General guidelines   Avoid eating more than 2,300 mg (milligrams) of salt (sodium) a day. If you have hypertension, you may need to reduce your sodium intake to 1,500 mg a day.   Limit alcohol intake to no more than 1 drink a day for nonpregnant women and 2 drinks a day for men. One drink equals 12 oz of beer, 5 oz of wine, or 1 oz of hard liquor.   Work with your health care provider to maintain a healthy body weight or to lose weight. Ask what an ideal weight is for you.   Get at least 30 minutes of exercise that causes your heart to beat faster (aerobic exercise) most days of the week. Activities may include walking, swimming, or biking.   Work with your health care provider or diet and nutrition specialist (dietitian) to adjust your eating plan to your individual calorie needs.  Reading food labels     Check food labels for the amount of sodium per serving. Choose foods with less than 5 percent of the Daily Value of sodium. Generally, foods with less than 300 mg of sodium per serving fit into this eating plan.   To find whole grains, look for the word "whole" as the first word in the ingredient list.  Shopping   Buy products labeled as "low-sodium" or "no salt added."   Buy fresh foods. Avoid canned foods and premade or frozen meals.  Cooking   Avoid adding salt when cooking. Use salt-free seasonings or herbs instead of table salt or sea salt. Check with your health care provider or pharmacist before using salt substitutes.   Do not fry foods. Cook foods using healthy methods such as baking, boiling, grilling, and broiling instead.   Cook with  heart-healthy oils, such as olive, canola, soybean, or sunflower oil.  Meal planning   Eat a balanced diet that includes:  ? 5 or more servings of fruits and vegetables each day. At each meal, try to fill half of your plate with fruits and vegetables.  ? Up to 6-8 servings of whole grains each day.  ? Less than 6 oz of lean meat, poultry, or fish each day. A 3-oz serving of meat is about the same size as a deck of cards. One egg equals 1 oz.  ? 2 servings of low-fat dairy each day.  ? A serving of nuts, seeds, or beans 5 times each week.  ? Heart-healthy fats. Healthy fats called Omega-3 fatty acids are found in foods such as flaxseeds and coldwater fish, like sardines, salmon, and mackerel.   Limit how much you eat of the following:  ? Canned or prepackaged foods.  ? Food that is high in trans fat, such as fried foods.  ? Food that is high in saturated fat, such as fatty meat.  ? Sweets, desserts, sugary drinks, and other foods with added sugar.  ? Full-fat dairy products.   Do not salt foods before eating.   Try to eat at least 2 vegetarian meals each week.   Eat more home-cooked food and less restaurant, buffet, and fast food.     When eating at a restaurant, ask that your food be prepared with less salt or no salt, if possible.  What foods are recommended?  The items listed may not be a complete list. Talk with your dietitian about what dietary choices are best for you.  Grains  Whole-grain or whole-wheat bread. Whole-grain or whole-wheat pasta. Brown rice. Oatmeal. Quinoa. Bulgur. Whole-grain and low-sodium cereals. Pita bread. Low-fat, low-sodium crackers. Whole-wheat flour tortillas.  Vegetables  Fresh or frozen vegetables (raw, steamed, roasted, or grilled). Low-sodium or reduced-sodium tomato and vegetable juice. Low-sodium or reduced-sodium tomato sauce and tomato paste. Low-sodium or reduced-sodium canned vegetables.  Fruits  All fresh, dried, or frozen fruit. Canned fruit in natural juice (without  added sugar).  Meat and other protein foods  Skinless chicken or turkey. Ground chicken or turkey. Pork with fat trimmed off. Fish and seafood. Egg whites. Dried beans, peas, or lentils. Unsalted nuts, nut butters, and seeds. Unsalted canned beans. Lean cuts of beef with fat trimmed off. Low-sodium, lean deli meat.  Dairy  Low-fat (1%) or fat-free (skim) milk. Fat-free, low-fat, or reduced-fat cheeses. Nonfat, low-sodium ricotta or cottage cheese. Low-fat or nonfat yogurt. Low-fat, low-sodium cheese.  Fats and oils  Soft margarine without trans fats. Vegetable oil. Low-fat, reduced-fat, or light mayonnaise and salad dressings (reduced-sodium). Canola, safflower, olive, soybean, and sunflower oils. Avocado.  Seasoning and other foods  Herbs. Spices. Seasoning mixes without salt. Unsalted popcorn and pretzels. Fat-free sweets.  What foods are not recommended?  The items listed may not be a complete list. Talk with your dietitian about what dietary choices are best for you.  Grains  Baked goods made with fat, such as croissants, muffins, or some breads. Dry pasta or rice meal packs.  Vegetables  Creamed or fried vegetables. Vegetables in a cheese sauce. Regular canned vegetables (not low-sodium or reduced-sodium). Regular canned tomato sauce and paste (not low-sodium or reduced-sodium). Regular tomato and vegetable juice (not low-sodium or reduced-sodium). Pickles. Olives.  Fruits  Canned fruit in a light or heavy syrup. Fried fruit. Fruit in cream or butter sauce.  Meat and other protein foods  Fatty cuts of meat. Ribs. Fried meat. Bacon. Sausage. Bologna and other processed lunch meats. Salami. Fatback. Hotdogs. Bratwurst. Salted nuts and seeds. Canned beans with added salt. Canned or smoked fish. Whole eggs or egg yolks. Chicken or turkey with skin.  Dairy  Whole or 2% milk, cream, and half-and-half. Whole or full-fat cream cheese. Whole-fat or sweetened yogurt. Full-fat cheese. Nondairy creamers. Whipped toppings.  Processed cheese and cheese spreads.  Fats and oils  Butter. Stick margarine. Lard. Shortening. Ghee. Bacon fat. Tropical oils, such as coconut, palm kernel, or palm oil.  Seasoning and other foods  Salted popcorn and pretzels. Onion salt, garlic salt, seasoned salt, table salt, and sea salt. Worcestershire sauce. Tartar sauce. Barbecue sauce. Teriyaki sauce. Soy sauce, including reduced-sodium. Steak sauce. Canned and packaged gravies. Fish sauce. Oyster sauce. Cocktail sauce. Horseradish that you find on the shelf. Ketchup. Mustard. Meat flavorings and tenderizers. Bouillon cubes. Hot sauce and Tabasco sauce. Premade or packaged marinades. Premade or packaged taco seasonings. Relishes. Regular salad dressings.  Where to find more information:   National Heart, Lung, and Blood Institute: www.nhlbi.nih.gov   American Heart Association: www.heart.org  Summary   The DASH eating plan is a healthy eating plan that has been shown to reduce high blood pressure (hypertension). It may also reduce your risk for type 2 diabetes, heart disease, and stroke.   With the   DASH eating plan, you should limit salt (sodium) intake to 2,300 mg a day. If you have hypertension, you may need to reduce your sodium intake to 1,500 mg a day.   When on the DASH eating plan, aim to eat more fresh fruits and vegetables, whole grains, lean proteins, low-fat dairy, and heart-healthy fats.   Work with your health care provider or diet and nutrition specialist (dietitian) to adjust your eating plan to your individual calorie needs.  This information is not intended to replace advice given to you by your health care provider. Make sure you discuss any questions you have with your health care provider.  Document Released: 04/07/2011 Document Revised: 04/11/2016 Document Reviewed: 04/11/2016  [employer] Interactive Patient Education  2019 [employer]

## 2018-07-06 NOTE — Telephone Encounter (Signed)
Advised patient that she can make own appointment.  She was given number to breast center.

## 2018-07-13 ENCOUNTER — Ambulatory Visit: Payer: Self-pay | Admitting: Family Medicine

## 2018-07-14 ENCOUNTER — Encounter: Payer: Self-pay | Admitting: Family Medicine

## 2018-07-14 ENCOUNTER — Ambulatory Visit (INDEPENDENT_AMBULATORY_CARE_PROVIDER_SITE_OTHER): Payer: Self-pay | Admitting: Family Medicine

## 2018-07-14 ENCOUNTER — Other Ambulatory Visit: Payer: Self-pay

## 2018-07-14 DIAGNOSIS — N939 Abnormal uterine and vaginal bleeding, unspecified: Secondary | ICD-10-CM

## 2018-07-14 NOTE — Progress Notes (Signed)
   Subjective:    Patient ID: Erica Ramos, female    DOB: 10-11-1975, 43 y.o.   MRN: [id number]  HPI  43 yo pt of Dr Laury Axon here with c/o vaginal bleeding   Wt Readings from Last 3 Encounters:  07/14/18 270 lb (122.5 kg)  07/05/18 266 lb (120.7 kg)  05/14/18 263 lb 3.2 oz (119.4 kg)   49.38 kg/m   Started OCP in the past (6 wk pp) She has ongoing bleeding after starting it   Then put on depo provera   No insurance - cannot afford gyn f/u   Review of Systems     Objective:   Physical Exam        Assessment & Plan:   Problem List Items Addressed This Visit      Other   Vaginal bleeding    Lifelong hx of irreg menses and possibly DUB -not improved with OC or 3 mo of depo  Is also morbidly obese  Likely needs gyn f/u - expressed this to her  US/ poss endo bx or D and C if no imp  Taking iron  No insurance- talked to her briefly today but did not charge for a visit given our inability to do anything today about her chronic problem

## 2018-07-15 DIAGNOSIS — N939 Abnormal uterine and vaginal bleeding, unspecified: Secondary | ICD-10-CM | POA: Insufficient documentation

## 2018-07-15 HISTORY — DX: Abnormal uterine and vaginal bleeding, unspecified: N93.9

## 2018-07-15 NOTE — Assessment & Plan Note (Signed)
Lifelong hx of irreg menses and possibly DUB -not improved with OC or 3 mo of depo  Is also morbidly obese  Likely needs gyn f/u - expressed this to her  US/ poss endo bx or D and C if no imp  Taking iron  No insurance- talked to her briefly today but did not charge for a visit given our inability to do anything today about her chronic problem

## 2018-07-18 ENCOUNTER — Ambulatory Visit (HOSPITAL_BASED_OUTPATIENT_CLINIC_OR_DEPARTMENT_OTHER)
Admission: RE | Admit: 2018-07-18 | Discharge: 2018-07-18 | Disposition: A | Discharge status: Home / Self Care | Payer: Self-pay | Source: Ambulatory Visit | Attending: Medical | Admitting: Medical

## 2018-07-18 ENCOUNTER — Other Ambulatory Visit: Payer: Self-pay

## 2018-07-18 ENCOUNTER — Ambulatory Visit: Payer: Self-pay | Admitting: Medical

## 2018-07-18 ENCOUNTER — Encounter: Payer: Self-pay | Admitting: Medical

## 2018-07-18 VITALS — BP 135/86 | HR 91 | Temp 98.4°F | Resp 16 | Ht 62.0 in | Wt 266.8 lb

## 2018-07-18 DIAGNOSIS — R059 Cough, unspecified: Secondary | ICD-10-CM

## 2018-07-18 DIAGNOSIS — J301 Allergic rhinitis due to pollen: Secondary | ICD-10-CM

## 2018-07-18 DIAGNOSIS — R05 Cough: Secondary | ICD-10-CM | POA: Insufficient documentation

## 2018-07-18 DIAGNOSIS — Z2089 Contact with and (suspected) exposure to other communicable diseases: Secondary | ICD-10-CM

## 2018-07-18 MED ORDER — AZITHROMYCIN 250 MG PO TABS: ORAL_TABLET | ORAL | 0 refills | Status: DC

## 2018-07-18 MED ORDER — BENZONATATE 100 MG PO CAPS: 100.0000 mg | ORAL_CAPSULE | Freq: Three times a day (TID) | ORAL | 0 refills | Status: DC | PRN

## 2018-07-18 MED ORDER — MONTELUKAST SODIUM 10 MG PO TABS
10.0000 mg | ORAL_TABLET | Freq: Every day | ORAL | 3 refills | Status: DC
Start: 2018-07-18 — End: 2018-11-27

## 2018-07-18 MED ORDER — FLUCONAZOLE 150 MG PO TABS: 150.0000 mg | ORAL_TABLET | Freq: Once | ORAL | 0 refills | Status: AC

## 2018-07-18 NOTE — Progress Notes (Signed)
Subjective:    Patient ID: Erica Ramos, female    DOB: 16-Jan-1976, 43 y.o.   MRN: [id number]  HPI Pt son recently had pneumonia. He is only 2 yo. So mom has direct contact. Mom has been face to face with child. Now mom has intermittent cough. She has some mucus. Pt also has nasal congestion with some sneeze. She has year round allergies.   Pt is on zyrtec and nasocort for allergies.  Pt not had any fever. No know contact with person with covid. No travel recently.   LMP-presently.   Review of Systems  Constitutional: Negative for chills and fever.  HENT: Positive for congestion and sneezing. Negative for ear discharge, ear pain, facial swelling and sinus pain.   Respiratory: Positive for cough. Negative for choking, chest tightness, shortness of breath and wheezing.   Cardiovascular: Negative for chest pain and palpitations.  Gastrointestinal: Negative for abdominal pain, diarrhea, nausea and vomiting.  Musculoskeletal: Negative for back pain and neck pain.  Skin: Negative for rash.  Neurological: Negative for dizziness, seizures, weakness and headaches.  Hematological: Negative for adenopathy. Does not bruise/bleed easily.  Psychiatric/Behavioral: Negative for behavioral problems, decreased concentration, sleep disturbance and suicidal ideas. The patient is not nervous/anxious.     Past Medical History:  Diagnosis Date  . Anxiety   . Bronchitis    chronic - has flare every December  . Cervical polyp    hx of  . Environmental allergies   . Genital warts   . GERD (gastroesophageal reflux disease)    Per MD chart note 12/01/2010  . Heart murmur   . Hx of abnormal Pap smear 2012  . Hypertension   . Morbid obesity (HCC)    BMI 47.9  . Persistent headaches    worst during menstrual cycle  . PVC (premature ventricular contraction) 02/20/2015  . STD (sexually transmitted disease)    hx genital warts/?HSV  . Vaginal Pap smear, abnormal      Social History    Socioeconomic History  . Marital status: Single    Spouse name: Not on file  . Number of children: 0  . Years of education: Not on file  . Highest education level: Not on file  Occupational History    Employer: [employer]  Social Needs  . Financial resource strain: Not on file  . Food insecurity:    Worry: Not on file    Inability: Not on file  . Transportation needs:    Medical: Not on file    Non-medical: Not on file  Tobacco Use  . Smoking status: Never Smoker  . Smokeless tobacco: Never Used  Substance and Sexual Activity  . Alcohol use: Yes    Alcohol/week: 1.0 standard drinks    Types: 1 Standard drinks or equivalent per week    Comment: Occasionally; less than 1-2 a week  . Drug use: No  . Sexual activity: Yes    Partners: Male    Birth control/protection: Condom    Comment: condoms sometimes  Lifestyle  . Physical activity:    Days per week: Not on file    Minutes per session: Not on file  . Stress: Not on file  Relationships  . Social connections:    Talks on phone: Not on file    Gets together: Not on file    Attends religious service: Not on file    Active member of club or organization: Not on file    Attends meetings of clubs or  organizations: Not on file    Relationship status: Not on file  . Intimate partner violence:    Fear of current or ex partner: Not on file    Emotionally abused: Not on file    Physically abused: Not on file    Forced sexual activity: Not on file  Other Topics Concern  . Not on file  Social History Narrative   Exercise--  2-3 x a week for about 30 min- 60 min    Past Surgical History:  Procedure Laterality Date  . CESAREAN SECTION N/A 06/05/2016   Procedure: CESAREAN SECTION;  Surgeon: Hoover Browns, MD;  Location: [facility];  Service: Obstetrics;  Laterality: N/A;  . CESAREAN SECTION N/A 09/20/2017   Procedure: CESAREAN SECTION;  Surgeon: Gerald Leitz, MD;  Location: [facility];  Service: Obstetrics;   Laterality: N/A;  EDD 10/02/17  . COLPOSCOPY  2012   no treatment to cervix--pap smears reverted to normal  . WISDOM TOOTH EXTRACTION      Family History  Problem Relation Age of Onset  . Diabetes Mother   . Hypertension Mother        Under control  . Diabetes Maternal Aunt   . Seizures Maternal Aunt   . Diabetes Maternal Grandmother   . Hypertension Maternal Grandmother   . Alcohol abuse Father   . Diabetes Father   . Hypertension Sister   . Diabetes Maternal Uncle   . Hypertension Other   . Obesity Other   . Heart attack Paternal Grandmother   . COPD Neg Hx     Allergies  Allergen Reactions  . Neosporin [Neomycin-Bacitracin Zn-Polymyx] Anaphylaxis  . Penicillins Nausea And Vomiting, Rash and Other (See Comments)    Has patient had a PCN reaction causing immediate rash, facial/tongue/throat swelling, SOB or lightheadedness with hypotension: Yes Has patient had a PCN reaction causing severe rash involving mucus membranes or skin necrosis: Yes Has patient had a PCN reaction that required hospitalization Yes Has patient had a PCN reaction occurring within the last 10 years: No If all of the above answers are "NO", then may proceed with Cephalosporin use.     Current Outpatient Medications on File Prior to Visit  Medication Sig Dispense Refill  . acetaminophen (TYLENOL) 500 MG tablet Take 500-1,000 mg by mouth daily as needed for headache.     . calcium carbonate (TUMS - DOSED IN MG ELEMENTAL CALCIUM) 500 MG chewable tablet Chew 3-4 tablets by mouth daily as needed for indigestion or heartburn.    . cetirizine (ZYRTEC) 10 MG tablet Take 1 tablet (10 mg total) by mouth daily. 90 tablet 3  . Cholecalciferol (VITAMIN D3) 5000 units CAPS Take 5,000 Units by mouth daily.    Marland Kitchen GARLIC PO Take 1 capsule by mouth daily.    . hydrochlorothiazide (HYDRODIURIL) 25 MG tablet Take 1 tablet (25 mg total) by mouth daily. 30 tablet 3  . ibuprofen (ADVIL,MOTRIN) 600 MG tablet Take 1 tablet (600  mg total) by mouth every 6 (six) hours as needed. 30 tablet 1  . metoprolol succinate (TOPROL-XL) 100 MG 24 hr tablet Take 1 tablet (100 mg total) by mouth daily. Take with or immediately following a meal. 90 tablet 3  . NIFEdipine (PROCARDIA XL) 60 MG 24 hr tablet Take 1 tablet (60 mg total) by mouth daily. 90 tablet 1  . omeprazole (PRILOSEC) 20 MG capsule Take 1 capsule (20 mg total) by mouth daily. 30 capsule 5  . Prenatal Vit-Fe Fumarate-FA (MULTIVITAMIN-PRENATAL) 27-0.8  MG TABS tablet Take 1 tablet by mouth daily.     . sertraline (ZOLOFT) 50 MG tablet Take 1 tablet (50 mg total) by mouth daily. 90 tablet 1  . triamcinolone (NASACORT AQ) 55 MCG/ACT AERO nasal inhaler Place 2 sprays into the nose daily. (Patient taking differently: Place 1 spray into the nose daily. ) 1 Inhaler 12   No current facility-administered medications on file prior to visit.     BP 135/86   Pulse 91   Temp 98.4 F (36.9 C) (Oral)   Resp 16   Ht  (1.575 m)   Wt 266 lb 12.8 oz (121 kg)   SpO2 100%   BMI 48.80 kg/m       Objective:   Physical Exam  General  Mental Status - Alert. General Appearance - Well groomed. Not in acute distress.  Skin Rashes- No Rashes.  HEENT Head- Normal. Ear Auditory Canal - Left- Normal. Right - Normal.Tympanic Membrane- Left- Normal. Right- Normal. Eye Sclera/Conjunctiva- Left- Normal. Right- Normal. Nose & Sinuses Nasal Mucosa- Left-  Boggy and Congested. Right-  Boggy and  Congested.Bilateral faint maxillary but no frontal sinus pressure. Mouth & Throat Lips: Upper Lip- Normal: no dryness, cracking, pallor, cyanosis, or vesicular eruption. Lower Lip-Normal: no dryness, cracking, pallor, cyanosis or vesicular eruption. Buccal Mucosa- Bilateral- No Aphthous ulcers. Oropharynx- No Discharge or Erythema. Tonsils: Characteristics- Bilateral- No Erythema or Congestion. Size/Enlargement- Bilateral- No enlargement. Discharge- bilateral-None.  Neck Neck- Supple. No  Masses.  Chest and Lung Exam Auscultation: Breath Sounds:-Clear even and unlabored.  Cardiovascular Auscultation:Rythm- Regular, rate and rhythm. Murmurs & Other Heart Sounds:Ausculatation of the heart reveal- No Murmurs.  Lymphatic Head & Neck General Head & Neck Lymphatics: Bilateral: Description- No Localized lymphadenopathy.       Assessment & Plan:  For allergic rhinitis, continue zyrtec and nasocort. If allergy symptoms worsen then start montelukast.  For cough, I am making benzonatate available.  If chest xray positive for pneumonia then start azithromycin(but may need to give additional as well). Also start azithromycin  if you devleope more sinus infection symptoms.  Follow up in 7-10 days or as needed

## 2018-07-18 NOTE — Patient Instructions (Addendum)
For allergic rhinitis, continue zyrtec and nasocort. If allergy symptoms worsen then start montelukast.  For cough, I am making benzonatate available.  If chest xray positive for pneumonia then start azithromycin(but may need to give additional as well). Also start azithromycin  if you devleope more sinus infection symptoms.  Follow up in 7-10 days or as needed

## 2018-08-03 ENCOUNTER — Encounter: Payer: Self-pay | Admitting: Family Medicine

## 2018-08-03 ENCOUNTER — Other Ambulatory Visit: Payer: Self-pay

## 2018-08-03 ENCOUNTER — Other Ambulatory Visit (INDEPENDENT_AMBULATORY_CARE_PROVIDER_SITE_OTHER): Payer: Self-pay

## 2018-08-03 ENCOUNTER — Ambulatory Visit (INDEPENDENT_AMBULATORY_CARE_PROVIDER_SITE_OTHER): Payer: Self-pay | Admitting: Family Medicine

## 2018-08-03 DIAGNOSIS — N939 Abnormal uterine and vaginal bleeding, unspecified: Secondary | ICD-10-CM

## 2018-08-03 LAB — CBC WITH DIFFERENTIAL/PLATELET
Basophils Absolute: 0.1 10*3/uL (ref 0.0–0.1)
Basophils Relative: 1.1 % (ref 0.0–3.0)
Eosinophils Absolute: 0.1 10*3/uL (ref 0.0–0.7)
Eosinophils Relative: 0.8 % (ref 0.0–5.0)
HCT: 38.1 % (ref 36.0–46.0)
Hemoglobin: 12.6 g/dL (ref 12.0–15.0)
Lymphocytes Relative: 22.6 % (ref 12.0–46.0)
Lymphs Abs: 2.5 10*3/uL (ref 0.7–4.0)
MCHC: 33 g/dL (ref 30.0–36.0)
MCV: 79 fl (ref 78.0–100.0)
Monocytes Absolute: 0.7 10*3/uL (ref 0.1–1.0)
Monocytes Relative: 6.2 % (ref 3.0–12.0)
Neutro Abs: 7.6 10*3/uL (ref 1.4–7.7)
Neutrophils Relative %: 69.3 % (ref 43.0–77.0)
Platelets: 370 10*3/uL (ref 150.0–400.0)
RBC: 4.82 Mil/uL (ref 3.87–5.11)
RDW: 15.2 % (ref 11.5–15.5)
WBC: 10.9 10*3/uL — ABNORMAL HIGH (ref 4.0–10.5)

## 2018-08-03 NOTE — Addendum Note (Signed)
Addended by: Miguel Aschoff on: 08/03/2018 02:44 PM   Modules accepted: Orders

## 2018-08-03 NOTE — Progress Notes (Signed)
Virtual Visit via Video Note  I connected with Erica Ramos on 08/03/18 at  9:15 AM EDT by a video enabled telemedicine application and verified that I am speaking with the correct person using two identifiers.   I discussed the limitations of evaluation and management by telemedicine and the availability of in person appointments. The patient expressed understanding and agreed to proceed.  History of Present Illness: Pt is in car and c/o vaginal bleeding -- she is unable to see her ob because she owes them $4000 and they wont see her until its paid.  She had been put on bcp and then tried depo but it did not help.  Its not heavy bleeding   Provider--- in office [facility]    Observations/Objective: bp good per pt, afebrile wt 266 lbs Ht 5 ft 2 in Pt NAd No sob No chest pain No dizziness/ fatigue  Assessment and Plan:  1. Vaginal bleeding Pt denies dizziness/ fatigue-- will get labs at [facility]  If bleeding worsens go to ER - Ambulatory referral to Obstetrics / Gynecology - CBC with Differential/Platelet; Future - CBC with Differential/Platelet; Future - Thyroid Panel With TSH; Future  Follow Up Instructions:    I discussed the assessment and treatment plan with the patient. The patient was provided an opportunity to ask questions and all were answered. The patient agreed with the plan and demonstrated an understanding of the instructions.   The patient was advised to call back or seek an in-person evaluation if the symptoms worsen or if the condition fails to improve as anticipated.    Donato Schultz, DO

## 2018-08-04 LAB — THYROID PANEL WITH TSH
Free Thyroxine Index: 2.3 (ref 1.4–3.8)
T3 Uptake: 33 % (ref 22–35)
T4, Total: 7.1 ug/dL (ref 5.1–11.9)
TSH: 2.19 mIU/L

## 2018-08-07 ENCOUNTER — Other Ambulatory Visit: Payer: Self-pay

## 2018-08-07 ENCOUNTER — Other Ambulatory Visit: Payer: Self-pay | Admitting: *Deleted

## 2018-08-07 ENCOUNTER — Other Ambulatory Visit (HOSPITAL_COMMUNITY)
Admission: RE | Admit: 2018-08-07 | Discharge: 2018-08-07 | Disposition: A | Discharge status: Home / Self Care | Payer: BLUE CROSS/BLUE SHIELD | Source: Ambulatory Visit | Attending: Family Medicine | Admitting: Family Medicine

## 2018-08-07 DIAGNOSIS — N939 Abnormal uterine and vaginal bleeding, unspecified: Secondary | ICD-10-CM

## 2018-08-08 LAB — URINE CYTOLOGY ANCILLARY ONLY
Bacterial vaginitis: POSITIVE — AB
Candida vaginitis: NEGATIVE

## 2018-08-09 ENCOUNTER — Other Ambulatory Visit: Payer: Self-pay | Admitting: Family Medicine

## 2018-08-09 DIAGNOSIS — N760 Acute vaginitis: Principal | ICD-10-CM

## 2018-08-09 DIAGNOSIS — B9689 Other specified bacterial agents as the cause of diseases classified elsewhere: Secondary | ICD-10-CM

## 2018-08-09 LAB — URINE CYTOLOGY ANCILLARY ONLY
Chlamydia: NEGATIVE
Neisseria Gonorrhea: NEGATIVE
Trichomonas: NEGATIVE

## 2018-08-09 MED ORDER — METRONIDAZOLE 500 MG PO TABS: 500.0000 mg | ORAL_TABLET | Freq: Two times a day (BID) | ORAL | 0 refills | Status: DC

## 2018-08-21 ENCOUNTER — Encounter: Payer: Self-pay | Admitting: Family Medicine

## 2018-10-03 ENCOUNTER — Other Ambulatory Visit: Payer: Self-pay | Admitting: Family Medicine

## 2018-10-03 DIAGNOSIS — I10 Essential (primary) hypertension: Secondary | ICD-10-CM

## 2018-10-04 DIAGNOSIS — H16203 Unspecified keratoconjunctivitis, bilateral: Secondary | ICD-10-CM | POA: Diagnosis not present

## 2018-10-04 DIAGNOSIS — H5213 Myopia, bilateral: Secondary | ICD-10-CM | POA: Diagnosis not present

## 2018-10-04 DIAGNOSIS — H1820 Unspecified corneal edema: Secondary | ICD-10-CM | POA: Diagnosis not present

## 2018-10-04 DIAGNOSIS — H10413 Chronic giant papillary conjunctivitis, bilateral: Secondary | ICD-10-CM | POA: Diagnosis not present

## 2018-10-09 ENCOUNTER — Telehealth: Payer: Self-pay | Admitting: Family Medicine

## 2018-10-09 ENCOUNTER — Other Ambulatory Visit: Payer: Self-pay | Admitting: Family Medicine

## 2018-10-09 ENCOUNTER — Other Ambulatory Visit: Payer: Self-pay

## 2018-10-09 DIAGNOSIS — Z1231 Encounter for screening mammogram for malignant neoplasm of breast: Secondary | ICD-10-CM

## 2018-10-09 DIAGNOSIS — I10 Essential (primary) hypertension: Secondary | ICD-10-CM

## 2018-10-09 MED ORDER — HYDROCHLOROTHIAZIDE 25 MG PO TABS: 25.0000 mg | ORAL_TABLET | Freq: Every day | ORAL | 3 refills | Status: DC

## 2018-10-09 NOTE — Telephone Encounter (Signed)
Copied from [place] [phone]. Topic: Quick Communication - Rx Refill/Question >> Oct 09, 2018 12:06 PM [name] wrote: Medication: hydrochlorothiazide (HYDRODIURIL) 25 MG tablet [[id number]] -Pt accidentally lost prescription, needs a new refill.   Has the patient contacted their pharmacy? Yes  (Agent: If no, request that the patient contact the pharmacy for the refill.) (Agent: If yes, when and what did the pharmacy advise?)  Preferred Pharmacy (with phone number or street name): [facility], [place], [place] [address] [phone]   Agent: Please be advised that RX refills may take up to 3 business days. We ask that you follow-up with your pharmacy.

## 2018-10-09 NOTE — Telephone Encounter (Signed)
Refill sent.

## 2018-10-18 ENCOUNTER — Ambulatory Visit
Admission: RE | Admit: 2018-10-18 | Discharge: 2018-10-18 | Disposition: A | Discharge status: Home / Self Care | Payer: BC Managed Care – PPO | Source: Ambulatory Visit | Attending: Family Medicine | Admitting: Family Medicine

## 2018-10-18 ENCOUNTER — Other Ambulatory Visit: Payer: Self-pay

## 2018-10-18 DIAGNOSIS — Z1231 Encounter for screening mammogram for malignant neoplasm of breast: Secondary | ICD-10-CM

## 2018-10-23 DIAGNOSIS — H16203 Unspecified keratoconjunctivitis, bilateral: Secondary | ICD-10-CM | POA: Diagnosis not present

## 2018-11-01 ENCOUNTER — Telehealth: Payer: Self-pay | Admitting: Family Medicine

## 2018-11-01 NOTE — Telephone Encounter (Signed)
I'm ok with doing the cpe. Can do next week if have slots.

## 2018-11-01 NOTE — Telephone Encounter (Signed)
Notified pt Dr. Carollee Herter is out of office 7/7 for scheduled CPE. Pt states that she has to have CPE as soon as possible because her hours are getting cut and she may not have insurance anymore. Please advise if pt can see PA, S[place] next week for CPE

## 2018-11-02 NOTE — Telephone Encounter (Signed)
That is fine---  I have added appointment to my schedule.  She we could do it the following week most likely Whatever is easiest for the pt

## 2018-11-05 ENCOUNTER — Other Ambulatory Visit: Payer: Self-pay

## 2018-11-06 ENCOUNTER — Ambulatory Visit: Payer: Self-pay | Admitting: Medical

## 2018-11-06 ENCOUNTER — Encounter: Payer: Self-pay | Admitting: Family Medicine

## 2018-11-08 ENCOUNTER — Ambulatory Visit (HOSPITAL_BASED_OUTPATIENT_CLINIC_OR_DEPARTMENT_OTHER)
Admission: RE | Admit: 2018-11-08 | Discharge: 2018-11-08 | Disposition: A | Discharge status: Home / Self Care | Payer: BC Managed Care – PPO | Source: Ambulatory Visit | Attending: Medical | Admitting: Medical

## 2018-11-08 ENCOUNTER — Ambulatory Visit (INDEPENDENT_AMBULATORY_CARE_PROVIDER_SITE_OTHER): Payer: BC Managed Care – PPO | Admitting: Medical

## 2018-11-08 ENCOUNTER — Other Ambulatory Visit: Payer: Self-pay

## 2018-11-08 ENCOUNTER — Encounter: Payer: Self-pay | Admitting: Medical

## 2018-11-08 ENCOUNTER — Telehealth: Payer: Self-pay | Admitting: Medical

## 2018-11-08 VITALS — BP 152/98 | HR 85 | Temp 98.0°F | Resp 18 | Ht 62.0 in | Wt 271.0 lb

## 2018-11-08 DIAGNOSIS — M79672 Pain in left foot: Secondary | ICD-10-CM

## 2018-11-08 DIAGNOSIS — Z0000 Encounter for general adult medical examination without abnormal findings: Secondary | ICD-10-CM

## 2018-11-08 DIAGNOSIS — M7732 Calcaneal spur, left foot: Secondary | ICD-10-CM | POA: Diagnosis not present

## 2018-11-08 LAB — CBC WITH DIFFERENTIAL/PLATELET
Basophils Absolute: 0.1 10*3/uL (ref 0.0–0.1)
Basophils Relative: 0.7 % (ref 0.0–3.0)
Eosinophils Absolute: 0.1 10*3/uL (ref 0.0–0.7)
Eosinophils Relative: 1.1 % (ref 0.0–5.0)
HCT: 39.5 % (ref 36.0–46.0)
Hemoglobin: 12.8 g/dL (ref 12.0–15.0)
Lymphocytes Relative: 20 % (ref 12.0–46.0)
Lymphs Abs: 1.7 10*3/uL (ref 0.7–4.0)
MCHC: 32.4 g/dL (ref 30.0–36.0)
MCV: 80.7 fl (ref 78.0–100.0)
Monocytes Absolute: 0.5 10*3/uL (ref 0.1–1.0)
Monocytes Relative: 6.4 % (ref 3.0–12.0)
Neutro Abs: 6.2 10*3/uL (ref 1.4–7.7)
Neutrophils Relative %: 71.8 % (ref 43.0–77.0)
Platelets: 300 10*3/uL (ref 150.0–400.0)
RBC: 4.89 Mil/uL (ref 3.87–5.11)
RDW: 15.8 % — ABNORMAL HIGH (ref 11.5–15.5)
WBC: 8.6 10*3/uL (ref 4.0–10.5)

## 2018-11-08 LAB — COMPREHENSIVE METABOLIC PANEL
ALT: 16 U/L (ref 0–35)
AST: 16 U/L (ref 0–37)
Albumin: 4.1 g/dL (ref 3.5–5.2)
Alkaline Phosphatase: 70 U/L (ref 39–117)
BUN: 12 mg/dL (ref 6–23)
CO2: 29 mEq/L (ref 19–32)
Calcium: 8.8 mg/dL (ref 8.4–10.5)
Chloride: 104 mEq/L (ref 96–112)
Creatinine, Ser: 0.63 mg/dL (ref 0.40–1.20)
GFR: 126.23 mL/min (ref >60.00)
Glucose, Bld: 106 mg/dL — ABNORMAL HIGH (ref 70–99)
Potassium: 4.1 mEq/L (ref 3.5–5.1)
Sodium: 139 mEq/L (ref 135–145)
Total Bilirubin: 0.5 mg/dL (ref 0.2–1.2)
Total Protein: 7 g/dL (ref 6.0–8.3)

## 2018-11-08 LAB — LIPID PANEL
Cholesterol: 148 mg/dL (ref 0–200)
HDL: 56.8 mg/dL (ref >39.00)
LDL Cholesterol: 79 mg/dL (ref 0–99)
NonHDL: 90.72
Total CHOL/HDL Ratio: 3
Triglycerides: 60 mg/dL (ref 0.0–149.0)
VLDL: 12 mg/dL (ref 0.0–40.0)

## 2018-11-08 LAB — URIC ACID: Uric Acid, Serum: 6.7 mg/dL (ref 2.4–7.0)

## 2018-11-08 NOTE — Patient Instructions (Addendum)
For you wellness exam today I have ordered cbc, cmp, and  lipid panel.  Vaccine up to date. Recommend flu vaccine early fall September.   Recommend exercise and healthy diet.  We will let you know lab results as they come in.  Recommend pt try weight watchers to help loose weight/overall health benefit.   For foot pain will get xray of foot. Can use tylenol and compression sock pending results xray and uric acid.  Follow up date appointment will be determined after lab review.      Preventive Care 9-51 Years Old, Female Preventive care refers to visits with your health care provider and lifestyle choices that can promote health and wellness. This includes:  A yearly physical exam. This may also be called an annual well check.  Regular dental visits and eye exams.  Immunizations.  Screening for certain conditions.  Healthy lifestyle choices, such as eating a healthy diet, getting regular exercise, not using drugs or products that contain nicotine and tobacco, and limiting alcohol use. What can I expect for my preventive care visit? Physical exam Your health care provider will check your:  Height and weight. This may be used to calculate body mass index (BMI), which tells if you are at a healthy weight.  Heart rate and blood pressure.  Skin for abnormal spots. Counseling Your health care provider may ask you questions about your:  Alcohol, tobacco, and drug use.  Emotional well-being.  Home and relationship well-being.  Sexual activity.  Eating habits.  Work and work Statistician.  Method of birth control.  Menstrual cycle.  Pregnancy history. What immunizations do I need?  Influenza (flu) vaccine  This is recommended every year. Tetanus, diphtheria, and pertussis (Tdap) vaccine  You may need a Td booster every 10 years. Varicella (chickenpox) vaccine  You may need this if you have not been vaccinated. Zoster (shingles) vaccine  You may need this  after age 74. Measles, mumps, and rubella (MMR) vaccine  You may need at least one dose of MMR if you were born in 1957 or later. You may also need a second dose. Pneumococcal conjugate (PCV13) vaccine  You may need this if you have certain conditions and were not previously vaccinated. Pneumococcal polysaccharide (PPSV23) vaccine  You may need one or two doses if you smoke cigarettes or if you have certain conditions. Meningococcal conjugate (MenACWY) vaccine  You may need this if you have certain conditions. Hepatitis A vaccine  You may need this if you have certain conditions or if you travel or work in places where you may be exposed to hepatitis A. Hepatitis B vaccine  You may need this if you have certain conditions or if you travel or work in places where you may be exposed to hepatitis B. Haemophilus influenzae type b (Hib) vaccine  You may need this if you have certain conditions. Human papillomavirus (HPV) vaccine  If recommended by your health care provider, you may need three doses over 6 months. You may receive vaccines as individual doses or as more than one vaccine together in one shot (combination vaccines). Talk with your health care provider about the risks and benefits of combination vaccines. What tests do I need? Blood tests  Lipid and cholesterol levels. These may be checked every 5 years, or more frequently if you are over 3 years old.  Hepatitis C test.  Hepatitis B test. Screening  Lung cancer screening. You may have this screening every year starting at age 39 if you  have a 30-pack-year history of smoking and currently smoke or have quit within the past 15 years.  Colorectal cancer screening. All adults should have this screening starting at age 67 and continuing until age 75. Your health care provider may recommend screening at age 95 if you are at increased risk. You will have tests every 1-10 years, depending on your results and the type of  screening test.  Diabetes screening. This is done by checking your blood sugar (glucose) after you have not eaten for a while (fasting). You may have this done every 1-3 years.  Mammogram. This may be done every 1-2 years. Talk with your health care provider about when you should start having regular mammograms. This may depend on whether you have a family history of breast cancer.  BRCA-related cancer screening. This may be done if you have a family history of breast, ovarian, tubal, or peritoneal cancers.  Pelvic exam and Pap test. This may be done every 3 years starting at age 27. Starting at age 93, this may be done every 5 years if you have a Pap test in combination with an HPV test. Other tests  Sexually transmitted disease (STD) testing.  Bone density scan. This is done to screen for osteoporosis. You may have this scan if you are at high risk for osteoporosis. Follow these instructions at home: Eating and drinking  Eat a diet that includes fresh fruits and vegetables, whole grains, lean protein, and low-fat dairy.  Take vitamin and mineral supplements as recommended by your health care provider.  Do not drink alcohol if: ? Your health care provider tells you not to drink. ? You are pregnant, may be pregnant, or are planning to become pregnant.  If you drink alcohol: ? Limit how much you have to 0-1 drink a day. ? Be aware of how much alcohol is in your drink. In the [place] one drink equals one 12 oz bottle of beer (355 mL), one 5 oz glass of wine (148 mL), or one 1 oz glass of hard liquor (44 mL). Lifestyle  Take daily care of your teeth and gums.  Stay active. Exercise for at least 30 minutes on 5 or more days each week.  Do not use any products that contain nicotine or tobacco, such as cigarettes, e-cigarettes, and chewing tobacco. If you need help quitting, ask your health care provider.  If you are sexually active, practice safe sex. Use a condom or other form of birth  control (contraception) in order to prevent pregnancy and STIs (sexually transmitted infections).  If told by your health care provider, take low-dose aspirin daily starting at age 81. What's next?  Visit your health care provider once a year for a well check visit.  Ask your health care provider how often you should have your eyes and teeth checked.  Stay up to date on all vaccines. This information is not intended to replace advice given to you by your health care provider. Make sure you discuss any questions you have with your health care provider. Document Released: 05/15/2015 Document Revised: 12/28/2017 Document Reviewed: 12/28/2017 Elsevier Patient Education  2020 [employer].

## 2018-11-08 NOTE — Telephone Encounter (Signed)
Opened to review 

## 2018-11-08 NOTE — Progress Notes (Addendum)
Subjective:    Patient ID: KIERRA LOH, female    DOB: Jan 02, 1976, 43 y.o.   MRN: [id number]  HPI    Pt in for cpe/wellness.  Pt up to date on vaccines. Reminded/recommended  to get flu vaccine early fall.  Up to date on mammogram. Negative this summer in June.  Up to date on pap as well.  Pt does not smoke. Social/minimal alcohol use. Pt admits poor diet. Pt not exercising other than walking at work.   Pt has htn. But not on medicine today/forgot.  LMP- 3 weeks ago.    Review of Systems  Constitutional: Negative for chills, fatigue and fever.  HENT: Negative for congestion, postnasal drip, sinus pressure and sinus pain.   Respiratory: Negative for cough, chest tightness, shortness of breath and wheezing.   Cardiovascular: Negative for chest pain and palpitations.  Gastrointestinal: Negative for abdominal pain.  Endocrine: Negative for polydipsia, polyphagia and polyuria.  Genitourinary: Negative for dysuria.  Musculoskeletal: Negative for back pain and myalgias.       Foot pain. Left 5th metatarsal area for about one week. No fall or trauma. She stands at work for hours on end.   Skin: Negative for rash.  Neurological: Negative for dizziness, weakness and headaches.  Hematological: Negative for adenopathy. Does not bruise/bleed easily.  Psychiatric/Behavioral: Negative for behavioral problems, decreased concentration, dysphoric mood, sleep disturbance and suicidal ideas. The patient is not nervous/anxious.     Past Medical History:  Diagnosis Date  . Anxiety   . Bronchitis    chronic - has flare every December  . Cervical polyp    hx of  . Environmental allergies   . Genital warts   . GERD (gastroesophageal reflux disease)    Per MD chart note 12/01/2010  . Heart murmur   . Hx of abnormal Pap smear 2012  . Hypertension   . Morbid obesity (HCC)    BMI 47.9  . Persistent headaches    worst during menstrual cycle  . PVC (premature ventricular contraction)  02/20/2015  . STD (sexually transmitted disease)    hx genital warts/?HSV  . Vaginal Pap smear, abnormal      Social History   Socioeconomic History  . Marital status: Single    Spouse name: Not on file  . Number of children: 0  . Years of education: Not on file  . Highest education level: Not on file  Occupational History    Employer: [employer]  Social Needs  . Financial resource strain: Not on file  . Food insecurity    Worry: Not on file    Inability: Not on file  . Transportation needs    Medical: Not on file    Non-medical: Not on file  Tobacco Use  . Smoking status: Never Smoker  . Smokeless tobacco: Never Used  Substance and Sexual Activity  . Alcohol use: Yes    Alcohol/week: 1.0 standard drinks    Types: 1 Standard drinks or equivalent per week    Comment: Occasionally; less than 1-2 a week  . Drug use: No  . Sexual activity: Yes    Partners: Male    Birth control/protection: Condom    Comment: condoms sometimes  Lifestyle  . Physical activity    Days per week: Not on file    Minutes per session: Not on file  . Stress: Not on file  Relationships  . Social Musician on phone: Not on file    Gets  together: Not on file    Attends religious service: Not on file    Active member of club or organization: Not on file    Attends meetings of clubs or organizations: Not on file    Relationship status: Not on file  . Intimate partner violence    Fear of current or ex partner: Not on file    Emotionally abused: Not on file    Physically abused: Not on file    Forced sexual activity: Not on file  Other Topics Concern  . Not on file  Social History Narrative   Exercise--  2-3 x a week for about 30 min- 60 min    Past Surgical History:  Procedure Laterality Date  . CESAREAN SECTION N/A 06/05/2016   Procedure: CESAREAN SECTION;  Surgeon: Hoover Browns, MD;  Location: [facility];  Service: Obstetrics;  Laterality: N/A;  . CESAREAN  SECTION N/A 09/20/2017   Procedure: CESAREAN SECTION;  Surgeon: Gerald Leitz, MD;  Location: [facility];  Service: Obstetrics;  Laterality: N/A;  EDD 10/02/17  . COLPOSCOPY  2012   no treatment to cervix--pap smears reverted to normal  . WISDOM TOOTH EXTRACTION      Family History  Problem Relation Age of Onset  . Diabetes Mother   . Hypertension Mother        Under control  . Diabetes Maternal Aunt   . Seizures Maternal Aunt   . Diabetes Maternal Grandmother   . Hypertension Maternal Grandmother   . Alcohol abuse Father   . Diabetes Father   . Hypertension Sister   . Diabetes Maternal Uncle   . Hypertension Other   . Obesity Other   . Heart attack Paternal Grandmother   . COPD Neg Hx     Allergies  Allergen Reactions  . Neosporin [Neomycin-Bacitracin Zn-Polymyx] Anaphylaxis  . Penicillins Nausea And Vomiting, Rash and Other (See Comments)    Has patient had a PCN reaction causing immediate rash, facial/tongue/throat swelling, SOB or lightheadedness with hypotension: Yes Has patient had a PCN reaction causing severe rash involving mucus membranes or skin necrosis: Yes Has patient had a PCN reaction that required hospitalization Yes Has patient had a PCN reaction occurring within the last 10 years: No If all of the above answers are "NO", then may proceed with Cephalosporin use.     Current Outpatient Medications on File Prior to Visit  Medication Sig Dispense Refill  . acetaminophen (TYLENOL) 500 MG tablet Take 500-1,000 mg by mouth daily as needed for headache.     . calcium carbonate (TUMS - DOSED IN MG ELEMENTAL CALCIUM) 500 MG chewable tablet Chew 3-4 tablets by mouth daily as needed for indigestion or heartburn.    . cetirizine (ZYRTEC) 10 MG tablet Take 1 tablet (10 mg total) by mouth daily. 90 tablet 3  . Cholecalciferol (VITAMIN D3) 5000 units CAPS Take 5,000 Units by mouth daily.    Marland Kitchen GARLIC PO Take 1 capsule by mouth daily.    . hydrochlorothiazide  (HYDRODIURIL) 25 MG tablet Take 1 tablet (25 mg total) by mouth daily. 30 tablet 3  . ibuprofen (ADVIL,MOTRIN) 600 MG tablet Take 1 tablet (600 mg total) by mouth every 6 (six) hours as needed. 30 tablet 1  . metoprolol succinate (TOPROL-XL) 100 MG 24 hr tablet Take 1 tablet (100 mg total) by mouth daily. Take with or immediately following a meal. 90 tablet 3  . montelukast (SINGULAIR) 10 MG tablet Take 1 tablet (10 mg total) by mouth  at bedtime. 30 tablet 3  . NIFEdipine (PROCARDIA XL/NIFEDICAL XL) 60 MG 24 hr tablet Take 1 tablet by mouth once daily 90 tablet 0  . omeprazole (PRILOSEC) 20 MG capsule Take 1 capsule (20 mg total) by mouth daily. 30 capsule 5  . Prenatal Vit-Fe Fumarate-FA (MULTIVITAMIN-PRENATAL) 27-0.8 MG TABS tablet Take 1 tablet by mouth daily.     . sertraline (ZOLOFT) 50 MG tablet Take 1 tablet (50 mg total) by mouth daily. 90 tablet 1  . triamcinolone (NASACORT AQ) 55 MCG/ACT AERO nasal inhaler Place 2 sprays into the nose daily. (Patient taking differently: Place 1 spray into the nose daily. ) 1 Inhaler 12   No current facility-administered medications on file prior to visit.     There were no vitals taken for this visit.      Objective:   Physical Exam  General Mental Status- Alert. General Appearance- Not in acute distress.   Skin General: Color- Normal Color. Moisture- Normal Moisture.  Neck Carotid Arteries- Normal color. Moisture- Normal Moisture. No carotid bruits. No JVD.  Chest and Lung Exam Auscultation: Breath Sounds:-Normal.  Cardiovascular Auscultation:Rythm- Regular. Murmurs & Other Heart Sounds:Auscultation of the heart reveals- No Murmurs.  Abdomen Inspection:-Inspeection Normal. Palpation/Percussion:Note:No mass. Palpation and Percussion of the abdomen reveal- Non Tender, Non Distended + BS, no rebound or guarding.   Neurologic Cranial Nerve exam:- CN III-XII intact(No nystagmus), symmetric smile. Strength:- 5/5 equal and symmetric  strength both upper and lower extremities.  Left foot- mid 5th metatrasal area pain. No swelling, no warmth.       Assessment & Plan:  For you wellness exam today I have ordered cbc, cmp, and  lipid panel.  Vaccine up to date. Recommend flu vaccine early fall September.   Recommend exercise and healthy diet.  We will let you know lab results as they come in.  Recommend pt try weight watchers to help loose weight/overall health benefit.   For foot pain will get xray of foot. Can use tylenol and compression sock pending results xray and uric acid.   Follow up date appointment will be determined after lab review.   [id number]. As addressed foot pain and discussed weight loss measures. 10 minutes.  Esperanza Richters, PA-C

## 2018-11-09 ENCOUNTER — Other Ambulatory Visit (INDEPENDENT_AMBULATORY_CARE_PROVIDER_SITE_OTHER): Payer: BC Managed Care – PPO

## 2018-11-09 DIAGNOSIS — R739 Hyperglycemia, unspecified: Secondary | ICD-10-CM | POA: Diagnosis not present

## 2018-11-09 LAB — HEMOGLOBIN A1C: Hgb A1c MFr Bld: 6.1 % (ref 4.6–6.5)

## 2018-11-19 ENCOUNTER — Telehealth: Payer: Self-pay | Admitting: Family Medicine

## 2018-11-19 ENCOUNTER — Other Ambulatory Visit: Payer: Self-pay

## 2018-11-19 DIAGNOSIS — I10 Essential (primary) hypertension: Secondary | ICD-10-CM

## 2018-11-19 MED ORDER — NIFEDIPINE ER OSMOTIC RELEASE 60 MG PO TB24: 60.0000 mg | ORAL_TABLET | Freq: Every day | ORAL | 1 refills | Status: DC

## 2018-11-19 NOTE — Telephone Encounter (Signed)
Resent to pharmacy for 90 days as insurance requested.

## 2018-11-19 NOTE — Telephone Encounter (Signed)
Medication Refill - Medication: NIFEdipine (PROCARDIA XL/NIFEDICAL XL) 60 MG 24 hr tablet    Has the patient contacted their pharmacy? Yes.  Pt called stating pharmacy has been trying to contact office. Pt states she is needing approval for 90 day supply of medication in order for her insurance to cover it. Pt states she is currently out of medication. Please advise.  (Agent: If no, request that the patient contact the pharmacy for the refill.) (Agent: If yes, when and what did the pharmacy advise?)  Preferred Pharmacy (with phone number or street name):  [facility], [place] [address]  Phone: [phone] Fax: [phone]  Not a 24 hour pharmacy; exact hours not known.     Agent: Please be advised that RX refills may take up to 3 business days. We ask that you follow-up with your pharmacy.

## 2018-11-19 NOTE — Telephone Encounter (Signed)
Gave verbal on this refill as pharmacy did not receive on their end.

## 2018-11-21 ENCOUNTER — Telehealth: Payer: Self-pay | Admitting: Medical

## 2018-11-21 ENCOUNTER — Encounter: Payer: Self-pay | Admitting: Medical

## 2018-11-21 DIAGNOSIS — M79673 Pain in unspecified foot: Secondary | ICD-10-CM

## 2018-11-21 NOTE — Telephone Encounter (Signed)
Referral to podiatrist placed

## 2018-11-27 ENCOUNTER — Encounter: Payer: Self-pay | Admitting: Podiatry

## 2018-11-27 ENCOUNTER — Other Ambulatory Visit: Payer: Self-pay

## 2018-11-27 ENCOUNTER — Ambulatory Visit (INDEPENDENT_AMBULATORY_CARE_PROVIDER_SITE_OTHER): Payer: BC Managed Care – PPO | Admitting: Podiatry

## 2018-11-27 VITALS — Temp 97.5°F

## 2018-11-27 DIAGNOSIS — M25472 Effusion, left ankle: Secondary | ICD-10-CM

## 2018-11-27 DIAGNOSIS — M7672 Peroneal tendinitis, left leg: Secondary | ICD-10-CM

## 2018-11-27 MED ORDER — MELOXICAM 7.5 MG PO TABS: 7.5000 mg | ORAL_TABLET | Freq: Every day | ORAL | 0 refills | Status: DC

## 2018-11-27 NOTE — Patient Instructions (Addendum)
Peroneal Tendinopathy Rehab Ask your health care provider which exercises are safe for you. Do exercises exactly as told by your health care provider and adjust them as directed. It is normal to feel mild stretching, pulling, tightness, or discomfort as you do these exercises. Stop right away if you feel sudden pain or your pain gets worse. Do not begin these exercises until told by your health care provider. Stretching and range-of-motion exercises These exercises warm up your muscles and joints and improve the movement and flexibility of your ankle. These exercises also help to relieve pain and stiffness. Gastroc and soleus stretch, standing This is an exercise in which you stand on a step and use your body weight to stretch your calf muscles. To do this exercise: 1. Stand on the edge of a step on the ball of your left / right foot. The ball of your foot is on the walking surface, right under your toes. 2. Keep your other foot firmly on the same step. 3. Hold on to the wall, a railing, or a chair for balance. 4. Slowly lift your other foot, allowing your body weight to press your left / right heel down over the edge of the step. You should feel a stretch in your left / right calf (gastrocnemius and soleus). 5. Hold this position for __________ seconds. 6. Return both feet to the step. 7. Repeat this exercise with a slight bend in your left / right knee. Repeat __________ times with your left / right knee straight and __________ times with your left / right knee bent. Complete this exercise __________ times a day. Strengthening exercises These exercises build strength and endurance in your foot and ankle. Endurance is the ability to use your muscles for a long time, even after they get tired. Ankle dorsiflexion with band  1. Secure a rubber exercise band or tube to an object, such as a table leg, that will not move when the band is pulled. 2. Secure the other end of the band around your left /  right foot. 3. Sit on the floor, facing the object with your left / right leg extended. The band or tube should be slightly tense when your foot is relaxed. 4. Slowly flex your left / right ankle and toes to bring your foot toward you (dorsiflexion). 5. Hold this position for __________ seconds. 6. Let the band or tube slowly pull your foot back to the starting position. Repeat __________ times. Complete this exercise __________ times a day. Ankle eversion 1. Sit on the floor with your legs straight out in front of you. 2. Loop a rubber exercise band or tube around the ball of your left / right foot. The ball of your foot is on the walking surface, right under your toes. 3. Hold the ends of the band in your hands, or secure the band to a stable object. The band or tube should be slightly tense when your foot is relaxed. 4. Slowly push your foot outward, away from your other leg (eversion). 5. Hold this position for __________ seconds. 6. Slowly return your foot to the starting position. Repeat __________ times. Complete this exercise __________ times a day. Plantar flexion, standing This exercise is sometimes called standing heel raise. 1. Stand with your feet shoulder-width apart. 2. Place your hands on a wall or table to steady yourself as needed, but try not to use it for support. 3. Keep your weight spread evenly over the width of your feet while you slowly  rise up on your toes (plantar flexion). If told by your health care provider: ? Shift your weight toward your left / right leg until you feel challenged. ? Stand on your left / right leg only. 4. Hold this position for __________ seconds. Repeat __________ times. Complete this exercise __________ times a day. Single leg stand 1. Without shoes, stand near a railing or in a doorway. You may hold on to the railing or door frame as needed. 2. Stand on your left / right foot. Keep your big toe down on the floor and try to keep your arch  lifted. ? Do not roll to the outside of your foot. ? If this exercise is too easy, you can try it with your eyes closed or while standing on a pillow. 3. Hold this position for __________ seconds. Repeat __________ times. Complete this exercise __________ times a day. This information is not intended to replace advice given to you by your health care provider. Make sure you discuss any questions you have with your health care provider. Document Released: 04/18/2005 Document Revised: 08/07/2018 Document Reviewed: 08/07/2018 Elsevier Patient Education  [employer].  Meloxicam tablets What is this medicine? MELOXICAM (mel OX i cam) is a non-steroidal anti-inflammatory drug (NSAID). It is used to reduce swelling and to treat pain. It may be used for osteoarthritis, rheumatoid arthritis, or juvenile rheumatoid arthritis. This medicine may be used for other purposes; ask your health care provider or pharmacist if you have questions. COMMON BRAND NAME(S): Mobic What should I tell my health care provider before I take this medicine? They need to know if you have any of these conditions:  bleeding disorders  cigarette smoker  coronary artery bypass graft (CABG) surgery within the past 2 weeks  drink more than 3 alcohol-containing drinks per day  heart disease  high blood pressure  history of stomach bleeding  kidney disease  liver disease  lung or breathing disease, like asthma  stomach or intestine problems  an unusual or allergic reaction to meloxicam, aspirin, other NSAIDs, other medicines, foods, dyes, or preservatives  pregnant or trying to get pregnant  breast-feeding How should I use this medicine? Take this medicine by mouth with a full glass of water. Follow the directions on the prescription label. You can take it with or without food. If it upsets your stomach, take it with food. Take your medicine at regular intervals. Do not take it more often than directed. Do  not stop taking except on your doctor's advice. A special MedGuide will be given to you by the pharmacist with each prescription and refill. Be sure to read this information carefully each time. Talk to your pediatrician regarding the use of this medicine in children. While this drug may be prescribed for selected conditions, precautions do apply. Patients over 75 years old may have a stronger reaction and need a smaller dose. Overdosage: If you think you have taken too much of this medicine contact a poison control center or emergency room at once. NOTE: This medicine is only for you. Do not share this medicine with others. What if I miss a dose? If you miss a dose, take it as soon as you can. If it is almost time for your next dose, take only that dose. Do not take double or extra doses. What may interact with this medicine? Do not take this medicine with any of the following medications:  cidofovir  ketorolac This medicine may also interact with the following medications:  aspirin and aspirin-like medicines  certain medicines for blood pressure, heart disease, irregular heart beat  certain medicines for depression, anxiety, or psychotic disturbances  certain medicines that treat or prevent blood clots like warfarin, enoxaparin, dalteparin, apixaban, dabigatran, rivaroxaban  cyclosporine  diuretics  fluconazole  lithium  methotrexate  other NSAIDs, medicines for pain and inflammation, like ibuprofen and naproxen  pemetrexed This list may not describe all possible interactions. Give your health care provider a list of all the medicines, herbs, non-prescription drugs, or dietary supplements you use. Also tell them if you smoke, drink alcohol, or use illegal drugs. Some items may interact with your medicine. What should I watch for while using this medicine? Tell your doctor or healthcare provider if your symptoms do not start to get better or if they get worse. This medicine  may cause serious skin reactions. They can happen weeks to months after starting the medicine. Contact your healthcare provider right away if you notice fevers or flu-like symptoms with a rash. The rash may be red or purple and then turn into blisters or peeling of the skin. Or, you might notice a red rash with swelling of the face, lips or lymph nodes in your neck or under your arms. Do not take other medicines that contain aspirin, ibuprofen, or naproxen with this medicine. Side effects such as stomach upset, nausea, or ulcers may be more likely to occur. Many medicines available without a prescription should not be taken with this medicine. This medicine can cause ulcers and bleeding in the stomach and intestines at any time during treatment. This can happen with no warning and may cause death. There is increased risk with taking this medicine for a long time. Smoking, drinking alcohol, older age, and poor health can also increase risks. Call your doctor right away if you have stomach pain or blood in your vomit or stool. This medicine does not prevent heart attack or stroke. In fact, this medicine may increase the chance of a heart attack or stroke. The chance may increase with longer use of this medicine and in people who have heart disease. If you take aspirin to prevent heart attack or stroke, talk with your doctor or healthcare provider. What side effects may I notice from receiving this medicine? Side effects that you should report to your doctor or health care professional as soon as possible:  allergic reactions like skin rash, itching or hives, swelling of the face, lips, or tongue  nausea, vomiting  redness, blistering, peeling, or loosening of the skin, including inside the mouth  signs and symptoms of a blood clot such as breathing problems; changes in vision; chest pain; severe, sudden headache; pain, swelling, warmth in the leg; trouble speaking; sudden numbness or weakness of the face,  arm, or leg  signs and symptoms of bleeding such as bloody or black, tarry stools; red or dark-brown urine; spitting up blood or brown material that looks like coffee grounds; red spots on the skin; unusual bruising or bleeding from the eye, gums, or nose  signs and symptoms of liver injury like dark yellow or brown urine; general ill feeling or flu-like symptoms; light-colored stools; loss of appetite; nausea; right upper belly pain; unusually weak or tired; yellowing of the eyes or skin  signs and symptoms of stroke like changes in vision; confusion; trouble speaking or understanding; severe headaches; sudden numbness or weakness of the face, arm, or leg; trouble walking; dizziness; loss of balance or coordination Side effects that usually do  not require medical attention (report to your doctor or health care professional if they continue or are bothersome):  constipation  diarrhea  gas This list may not describe all possible side effects. Call your doctor for medical advice about side effects. You may report side effects to FDA at 1-800-FDA-1088. Where should I keep my medicine? Keep out of the reach of children. Store at room temperature between 15 and 30 degrees C (59 and 86 degrees F). Throw away any unused medicine after the expiration date. NOTE: This sheet is a summary. It may not cover all possible information. If you have questions about this medicine, talk to your doctor, pharmacist, or health care provider.  2020 Elsevier/Gold Standard (2018-07-18 11:21:28)

## 2018-11-28 ENCOUNTER — Other Ambulatory Visit: Payer: Self-pay | Admitting: *Deleted

## 2018-11-28 DIAGNOSIS — I10 Essential (primary) hypertension: Secondary | ICD-10-CM

## 2018-11-28 MED ORDER — HYDROCHLOROTHIAZIDE 25 MG PO TABS: 25.0000 mg | ORAL_TABLET | Freq: Every day | ORAL | 1 refills | Status: DC

## 2018-11-28 MED ORDER — METOPROLOL SUCCINATE ER 100 MG PO TB24: 100.0000 mg | ORAL_TABLET | Freq: Every day | ORAL | 1 refills | Status: DC

## 2018-11-28 MED ORDER — NIFEDIPINE ER OSMOTIC RELEASE 60 MG PO TB24: 60.0000 mg | ORAL_TABLET | Freq: Every day | ORAL | 1 refills | Status: DC

## 2018-11-30 NOTE — Progress Notes (Signed)
Subjective:   Patient ID: Erica Ramos, female   DOB: 43 y.o.   MRN: [id number]   HPI 43 year old female presents the office with concerns of pain to the left lateral foot which is been ongoing for the last 1 month.  She says it hurts on the side of the heel.  She said it hurts when she first stands up and starts to get achy by the end of the day.  She states that she feels like she is walking on a bruise.  She is tried ice, Tylenol any significant improvement she is wearing compression socks.  She states that barefoot also make it worse.  When she is in.  No pain.   Review of Systems  All other systems reviewed and are negative.  Past Medical History:  Diagnosis Date  . Anxiety   . Bronchitis    chronic - has flare every December  . Cervical polyp    hx of  . Environmental allergies   . Genital warts   . GERD (gastroesophageal reflux disease)    Per MD chart note 12/01/2010  . Heart murmur   . Hx of abnormal Pap smear 2012  . Hypertension   . Morbid obesity (HCC)    BMI 47.9  . Persistent headaches    worst during menstrual cycle  . PVC (premature ventricular contraction) 02/20/2015  . STD (sexually transmitted disease)    hx genital warts/?HSV  . Vaginal Pap smear, abnormal     Past Surgical History:  Procedure Laterality Date  . CESAREAN SECTION N/A 06/05/2016   Procedure: CESAREAN SECTION;  Surgeon: Hoover BrownsEma Kulwa, MD;  Location: [facility];  Service: Obstetrics;  Laterality: N/A;  . CESAREAN SECTION N/A 09/20/2017   Procedure: CESAREAN SECTION;  Surgeon: Gerald Leitzole, Tara, MD;  [facility];  Service: Obstetrics;  Laterality: N/A;  EDD 10/02/17  . COLPOSCOPY  2012   no treatment to cervix--pap smears reverted to normal  . WISDOM TOOTH EXTRACTION       Current Outpatient Medications:  .  acetaminophen (TYLENOL) 500 MG tablet, Take 500-1,000 mg by mouth daily as needed for headache. , Disp: , Rfl:  .  calcium carbonate (TUMS - DOSED IN MG ELEMENTAL CALCIUM)  500 MG chewable tablet, Chew 3-4 tablets by mouth daily as needed for indigestion or heartburn., Disp: , Rfl:  .  cetirizine (ZYRTEC) 10 MG tablet, Take 1 tablet (10 mg total) by mouth daily., Disp: 90 tablet, Rfl: 3 .  Cholecalciferol (VITAMIN D3) 5000 units CAPS, Take 5,000 Units by mouth daily., Disp: , Rfl:  .  clindamycin (CLEOCIN) 150 MG capsule, TK 2 CS PO TID FOR 7 DAYS, Disp: , Rfl:  .  GARLIC PO, Take 1 capsule by mouth daily., Disp: , Rfl:  .  hydrochlorothiazide (HYDRODIURIL) 25 MG tablet, Take 1 tablet (25 mg total) by mouth daily., Disp: 90 tablet, Rfl: 1 .  ibuprofen (ADVIL,MOTRIN) 600 MG tablet, Take 1 tablet (600 mg total) by mouth every 6 (six) hours as needed., Disp: 30 tablet, Rfl: 1 .  meloxicam (MOBIC) 7.5 MG tablet, Take 1 tablet (7.5 mg total) by mouth daily., Disp: 30 tablet, Rfl: 0 .  metoprolol succinate (TOPROL-XL) 100 MG 24 hr tablet, Take 1 tablet (100 mg total) by mouth daily. Take with or immediately following a meal., Disp: 90 tablet, Rfl: 1 .  NIFEdipine (PROCARDIA XL/NIFEDICAL XL) 60 MG 24 hr tablet, Take 1 tablet (60 mg total) by mouth daily., Disp: 90 tablet, Rfl: 1 .  omeprazole (PRILOSEC) 20 MG capsule, Take 1 capsule (20 mg total) by mouth daily., Disp: 30 capsule, Rfl: 5 .  Prenatal Vit-Fe Fumarate-FA (MULTIVITAMIN-PRENATAL) 27-0.8 MG TABS tablet, Take 1 tablet by mouth daily. , Disp: , Rfl:  .  triamcinolone (NASACORT AQ) 55 MCG/ACT AERO nasal inhaler, Place 2 sprays into the nose daily. (Patient taking differently: Place 1 spray into the nose daily. ), Disp: 1 Inhaler, Rfl: 12  Allergies  Allergen Reactions  . Neosporin [Neomycin-Bacitracin Zn-Polymyx] Anaphylaxis  . Penicillins Nausea And Vomiting, Rash and Other (See Comments)    Has patient had a PCN reaction causing immediate rash, facial/tongue/throat swelling, SOB or lightheadedness with hypotension: Yes Has patient had a PCN reaction causing severe rash involving mucus membranes or skin necrosis:  Yes Has patient had a PCN reaction that required hospitalization Yes Has patient had a PCN reaction occurring within the last 10 years: No If all of the above answers are "NO", then may proceed with Cephalosporin use.           Objective:  Physical Exam  General: AAO x3, NAD  Dermatological: Skin is warm, dry and supple bilateral. Nails x 10 are well manicured; remaining integument appears unremarkable at this time. There are no open sores, no preulcerative lesions, no rash or signs of infection present.  Vascular: Dorsalis Pedis artery and Posterior Tibial artery pedal pulses are 2/4 bilateral with immedate capillary fill time. There is no pain with calf compression, swelling, warmth, erythema.   Neruologic: Grossly intact via light touch bilateral.  Protective threshold with Semmes Wienstein monofilament intact to all pedal sites bilateral.   Musculoskeletal: There is tenderness palpation over the course the peroneal tendon posterior and inferior to the lateral malleolus.  There is no pain with lateral compression of calcaneus.  No pain with Achilles tendon.  No pain upon the fascia.  Minimal edema but no erythema or warmth.  Muscular strength 5/5 in all groups tested bilateral.  Gait: Unassisted, Nonantalgic.       Assessment:   Peroneal tendinitis left side     Plan:  -Treatment options discussed including all alternatives, risks, and complications -Etiology of symptoms were discussed -X-rays were obtained and reviewed with the patient.  No evidence of acute fracture or stress fracture. -Tri-Lock ankle brace dispensed -Prescribed mobic. Discussed side effects of the medication and directed to stop if any are to occur and call the office.  -Stretching, icing. -We discussed changing shoes.  Possible orthotics as well.  Return in about 4 weeks (around 12/25/2018), or if symptoms worsen or fail to improve.  Trula Slade DPM

## 2018-12-04 ENCOUNTER — Other Ambulatory Visit: Payer: Self-pay | Admitting: *Deleted

## 2018-12-04 DIAGNOSIS — I10 Essential (primary) hypertension: Secondary | ICD-10-CM

## 2018-12-04 MED ORDER — METOPROLOL SUCCINATE ER 100 MG PO TB24: 100.0000 mg | ORAL_TABLET | Freq: Every day | ORAL | 1 refills | Status: DC

## 2018-12-04 MED ORDER — NIFEDIPINE ER OSMOTIC RELEASE 60 MG PO TB24: 60.0000 mg | ORAL_TABLET | Freq: Every day | ORAL | 1 refills | Status: DC

## 2018-12-25 ENCOUNTER — Ambulatory Visit: Payer: BC Managed Care – PPO | Admitting: Podiatry

## 2018-12-26 ENCOUNTER — Other Ambulatory Visit: Payer: Self-pay | Admitting: Podiatry

## 2018-12-26 MED ORDER — MELOXICAM 7.5 MG PO TABS: 7.5000 mg | ORAL_TABLET | Freq: Every day | ORAL | 0 refills | Status: DC

## 2019-01-08 ENCOUNTER — Encounter: Payer: Self-pay | Admitting: Family Medicine

## 2019-01-08 ENCOUNTER — Ambulatory Visit: Payer: BC Managed Care – PPO | Admitting: Podiatry

## 2019-01-08 NOTE — Telephone Encounter (Signed)
Should be ok to try

## 2019-01-11 ENCOUNTER — Encounter (HOSPITAL_COMMUNITY): Payer: Self-pay

## 2019-01-11 ENCOUNTER — Ambulatory Visit (HOSPITAL_COMMUNITY)
Admission: EM | Admit: 2019-01-11 | Discharge: 2019-01-11 | Disposition: A | Discharge status: Home / Self Care | Payer: BC Managed Care – PPO | Attending: Emergency Medicine | Admitting: Emergency Medicine

## 2019-01-11 ENCOUNTER — Other Ambulatory Visit: Payer: Self-pay

## 2019-01-11 DIAGNOSIS — R21 Rash and other nonspecific skin eruption: Secondary | ICD-10-CM | POA: Diagnosis not present

## 2019-01-11 MED ORDER — TRIAMCINOLONE ACETONIDE 0.1 % EX CREA: 1.0000 "application " | TOPICAL_CREAM | Freq: Two times a day (BID) | CUTANEOUS | 0 refills | Status: AC | PRN

## 2019-01-11 NOTE — Discharge Instructions (Signed)
Benadryl at needed for itching.  Kenalog as needed for itching, topically.  I would expect this to improve over the next week.  Please return for any worsening of symptoms- facial swelling or involvement of throat or breathing.

## 2019-01-11 NOTE — ED Triage Notes (Addendum)
Patient reports she started 2 hrs ago she started having a rash in her trunk and spreaded to her arms. She states she had a similar rash 5 years ago when she used Neosporin. She took Children Benadryl.

## 2019-01-12 NOTE — ED Provider Notes (Signed)
[facility]    CSN: [id number] Arrival date & time: 01/11/19  1719      History   Chief Complaint Chief Complaint  Patient presents with  . Rash    HPI Erica Ramos is a 43 y.o. female.   Erica EhrichChandra D Crosson presents with complaints of rash to her abdomen which she noted today around 4p. She hadn't eaten anything new, no new exposures or allergents she is aware of. Seems to have improved some. Itches. No pain. States she has had allergic response in the past which spread and caused facial swelling, which prompted her to seek evaluation today. Took some benadryl prior to arrival which seems to have helped. No facial rash or swelling. No throat itching, wheezing or difficulty breathing. States she did walk 4 miles outside today in the heat and was wearing a fanny pack style belt around her waste at the affected area. History of sun and heat sensitivity.     ROS per HPI, negative if not otherwise mentioned.      Past Medical History:  Diagnosis Date  . Anxiety   . Bronchitis    chronic - has flare every December  . Cervical polyp    hx of  . Environmental allergies   . Genital warts   . GERD (gastroesophageal reflux disease)    Per MD chart note 12/01/2010  . Heart murmur   . Hx of abnormal Pap smear 2012  . Hypertension   . Morbid obesity (HCC)    BMI 47.9  . Persistent headaches    worst during menstrual cycle  . PVC (premature ventricular contraction) 02/20/2015  . STD (sexually transmitted disease)    hx genital warts/?HSV  . Vaginal Pap smear, abnormal     Patient Active Problem List   Diagnosis Date Noted  . Vaginal bleeding 07/15/2018  . GERD (gastroesophageal reflux disease) 05/14/2018  . Depression, major, single episode, moderate (HCC) 04/19/2018  . Chronic hypertension 09/20/2017  . Hypertensive disorder 06/17/2017  . Delivered by cesarean section 06/05/2016  . Hypertension affecting pregnancy in third trimester 06/03/2016  . Right knee  pain 07/24/2015  . PVC (premature ventricular contraction) 02/20/2015  . Constipation 10/30/2014  . Obesity (BMI 30-39.9) 07/05/2013  . Anxiety 05/01/2013  . Abscess of axilla, left 05/01/2013  . Chest pain 05/01/2013  . Seasonal allergic rhinitis 06/28/2012  . Obesity, morbid (HCC) 06/28/2012  . HTN (hypertension) 10/12/2011    Past Surgical History:  Procedure Laterality Date  . CESAREAN SECTION N/A 06/05/2016   Procedure: CESAREAN SECTION;  Surgeon: Hoover BrownsEma Kulwa, MD;  Location: [facility];  Service: Obstetrics;  Laterality: N/A;  . CESAREAN SECTION N/A 09/20/2017   Procedure: CESAREAN SECTION;  Surgeon: Gerald Leitzole, Tara, MD;  [facility];  Service: Obstetrics;  Laterality: N/A;  EDD 10/02/17  . COLPOSCOPY  2012   no treatment to cervix--pap smears reverted to normal  . WISDOM TOOTH EXTRACTION      OB History    Gravida  3   Para  2   Term  2   Preterm      AB  1   Living  2     SAB  1   TAB      Ectopic      Multiple  0   Live Births  2            Home Medications    Prior to Admission medications   Medication Sig Start Date End Date  Taking? Authorizing Provider  acetaminophen (TYLENOL) 500 MG tablet Take 500-1,000 mg by mouth daily as needed for headache.     [provider]  calcium carbonate (TUMS - DOSED IN MG ELEMENTAL CALCIUM) 500 MG chewable tablet Chew 3-4 tablets by mouth daily as needed for indigestion or heartburn.    [provider]  cetirizine (ZYRTEC) 10 MG tablet Take 1 tablet (10 mg total) by mouth daily. 04/29/14   Donato SchultzLowne Chase, Yvonne R, DO  Cholecalciferol (VITAMIN D3) 5000 units CAPS Take 5,000 Units by mouth daily.    [provider]  clindamycin (CLEOCIN) 150 MG capsule TK 2 CS PO TID FOR 7 DAYS 11/22/18   [provider]  GARLIC PO Take 1 capsule by mouth daily.    [provider]  hydrochlorothiazide (HYDRODIURIL) 25 MG tablet Take 1 tablet (25 mg total) by mouth daily.  11/28/18   Donato SchultzLowne Chase, Yvonne R, DO  ibuprofen (ADVIL,MOTRIN) 600 MG tablet Take 1 tablet (600 mg total) by mouth every 6 (six) hours as needed. 09/23/17   Gerald Leitzole, Tara, MD  meloxicam (MOBIC) 7.5 MG tablet Take 1 tablet (7.5 mg total) by mouth daily. 12/26/18   Vivi BarrackWagoner, Matthew R, DPM  metoprolol succinate (TOPROL-XL) 100 MG 24 hr tablet Take 1 tablet (100 mg total) by mouth daily. Take with or immediately following a meal. 12/04/18   Zola ButtonLowne Chase, Grayling CongressYvonne R, DO  NIFEdipine (PROCARDIA XL/NIFEDICAL XL) 60 MG 24 hr tablet Take 1 tablet (60 mg total) by mouth daily. 12/04/18   Donato SchultzLowne Chase, Yvonne R, DO  omeprazole (PRILOSEC) 20 MG capsule Take 1 capsule (20 mg total) by mouth daily. 10/08/14   Dorna LeitzBush, Nicole V, PA-C  Prenatal Vit-Fe Fumarate-FA (MULTIVITAMIN-PRENATAL) 27-0.8 MG TABS tablet Take 1 tablet by mouth daily.     [provider]  triamcinolone (NASACORT AQ) 55 MCG/ACT AERO nasal inhaler Place 2 sprays into the nose daily. Patient taking differently: Place 1 spray into the nose daily.  02/23/15   Seabron SpatesLowne Chase, Yvonne R, DO  triamcinolone cream (KENALOG) 0.1 % Apply 1 application topically 2 (two) times daily as needed. 01/11/19   Georgetta HaberBurky, Niya Behler B, NP    Family History Family History  Problem Relation Age of Onset  . Diabetes Mother   . Hypertension Mother        Under control  . Diabetes Maternal Aunt   . Seizures Maternal Aunt   . Diabetes Maternal Grandmother   . Hypertension Maternal Grandmother   . Alcohol abuse Father   . Diabetes Father   . Hypertension Sister   . Diabetes Maternal Uncle   . Hypertension Other   . Obesity Other   . Heart attack Paternal Grandmother   . COPD Neg Hx     Social History Social History   Tobacco Use  . Smoking status: Never Smoker  . Smokeless tobacco: Never Used  Substance Use Topics  . Alcohol use: Yes    Alcohol/week: 1.0 standard drinks    Types: 1 Standard drinks or equivalent per week    Comment: Occasionally; less than 1-2 a week  .  Drug use: No     Allergies   Neosporin [neomycin-bacitracin zn-polymyx], Penicillins, and Strawberry flavor   Review of Systems Review of Systems   Physical Exam Triage Vital Signs ED Triage Vitals  Enc Vitals Group     BP 01/11/19 1756 (!) 147/86     Pulse Rate 01/11/19 1756 85     Resp 01/11/19 1756 16  Temp 01/11/19 1756 97.9 F (36.6 C)     Temp Source 01/11/19 1756 Oral     SpO2 01/11/19 1756 96 %     Weight --      Height --      Head Circumference --      Peak Flow --      Pain Score 01/11/19 1752 0     Pain Loc --      Pain Edu? --      Excl. in GC? --    No data found.  Updated Vital Signs BP (!) 147/86 (BP Location: Left Arm)   Pulse 85   Temp 97.9 F (36.6 C) (Oral)   Resp 16   SpO2 96%    Physical Exam Constitutional:      General: She is not in acute distress.    Appearance: She is well-developed.  HENT:     Head: Normocephalic and atraumatic.     Mouth/Throat:     Mouth: Mucous membranes are moist.     Pharynx: Oropharynx is clear.  Cardiovascular:     Rate and Rhythm: Normal rate and regular rhythm.     Heart sounds: Normal heart sounds.  Pulmonary:     Effort: Pulmonary effort is normal.     Breath sounds: Normal breath sounds.  Skin:    General: Skin is warm and dry.          Comments: Fine raised sandpaper/ papular rash in linear pattern to abdomen; noted similar rash to left anterior shoulder as well as forearm which patient expresses is related to sun exposure  Neurological:     Mental Status: She is alert and oriented to person, place, and time.      UC Treatments / Results  Labs (all labs ordered are listed, but only abnormal results are displayed) Labs Reviewed - No data to display  EKG   Radiology No results found.  Procedures Procedures (including critical care time)  Medications Ordered in UC Medications - No data to display  Initial Impression / Assessment and Plan / UC Course  I have reviewed the  triage vital signs and the nursing notes.  Pertinent labs & imaging results that were available during my care of the patient were reviewed by me and considered in my medical decision making (see chart for details).     No facial or oral involvement. Lungs clear, no wheezing or increased work of breathing. No evidence of hives. In further discussion with patient she does endorse wearing a belt to the affected area while outside in the heat. This does appear consistent with heat rash. Patient will use benadryl as needed for itching. Kenalog as needed. Return precautions provided. Patient agreeable to deferring further steroid treatment at this time. Patient verbalized understanding and agreeable to plan.    Final Clinical Impressions(s) / UC Diagnoses   Final diagnoses:  Rash     Discharge Instructions     Benadryl at needed for itching.  Kenalog as needed for itching, topically.  I would expect this to improve over the next week.  Please return for any worsening of symptoms- facial swelling or involvement of throat or breathing.    ED Prescriptions    Medication Sig Dispense Auth. Provider   triamcinolone cream (KENALOG) 0.1 % Apply 1 application topically 2 (two) times daily as needed. 30 g Georgetta Haber, NP     Controlled Substance Prescriptions [place] Controlled Substance Registry consulted? Not Applicable   Georgetta Haber,  NP 01/12/19 1029

## 2019-01-28 ENCOUNTER — Telehealth: Payer: Self-pay | Admitting: *Deleted

## 2019-01-28 ENCOUNTER — Other Ambulatory Visit: Payer: Self-pay | Admitting: *Deleted

## 2019-01-28 DIAGNOSIS — N926 Irregular menstruation, unspecified: Secondary | ICD-10-CM | POA: Diagnosis not present

## 2019-01-28 DIAGNOSIS — Z319 Encounter for procreative management, unspecified: Secondary | ICD-10-CM | POA: Diagnosis not present

## 2019-01-28 DIAGNOSIS — N920 Excessive and frequent menstruation with regular cycle: Secondary | ICD-10-CM | POA: Diagnosis not present

## 2019-01-28 MED ORDER — MELOXICAM 7.5 MG PO TABS: 7.5000 mg | ORAL_TABLET | Freq: Every day | ORAL | 0 refills | Status: DC

## 2019-01-28 NOTE — Telephone Encounter (Signed)
Per Dr Jacqualyn Posey ok to refill the meloxicam 7.5 mg, 30 tablets and no refills and called and left a message for the patient to call me back to let patient know that I refilled the medicine. Lattie Haw

## 2019-01-28 NOTE — Telephone Encounter (Signed)
Called and left a message for the patient stating I was calling to see how patient was doing and also to see if patient needed another refill of the Meloxicam 7.5 mg and to call the office at [phone]. Erica Ramos

## 2019-02-08 ENCOUNTER — Ambulatory Visit: Payer: BC Managed Care – PPO | Admitting: Podiatry

## 2019-02-27 ENCOUNTER — Other Ambulatory Visit: Payer: Self-pay | Admitting: Obstetrics and Gynecology

## 2019-02-27 DIAGNOSIS — N926 Irregular menstruation, unspecified: Secondary | ICD-10-CM | POA: Diagnosis not present

## 2019-02-27 DIAGNOSIS — N920 Excessive and frequent menstruation with regular cycle: Secondary | ICD-10-CM | POA: Diagnosis not present

## 2019-02-27 DIAGNOSIS — N939 Abnormal uterine and vaginal bleeding, unspecified: Secondary | ICD-10-CM | POA: Diagnosis not present

## 2019-03-12 ENCOUNTER — Telehealth: Payer: Self-pay | Admitting: *Deleted

## 2019-03-12 ENCOUNTER — Other Ambulatory Visit: Payer: Self-pay | Admitting: *Deleted

## 2019-03-12 DIAGNOSIS — I10 Essential (primary) hypertension: Secondary | ICD-10-CM

## 2019-03-12 MED ORDER — HYDROCHLOROTHIAZIDE 25 MG PO TABS: 25.0000 mg | ORAL_TABLET | Freq: Every day | ORAL | 0 refills | Status: DC

## 2019-03-12 NOTE — Telephone Encounter (Signed)
Left message on machine that she needs to call back to make appointment for 3 month follow up.

## 2019-03-15 ENCOUNTER — Ambulatory Visit (INDEPENDENT_AMBULATORY_CARE_PROVIDER_SITE_OTHER): Payer: BC Managed Care – PPO | Admitting: Family Medicine

## 2019-03-15 ENCOUNTER — Other Ambulatory Visit: Payer: Self-pay

## 2019-03-15 ENCOUNTER — Encounter: Payer: Self-pay | Admitting: Family Medicine

## 2019-03-15 DIAGNOSIS — I10 Essential (primary) hypertension: Secondary | ICD-10-CM

## 2019-03-15 LAB — LIPID PANEL
Cholesterol: 171 mg/dL (ref 0–200)
HDL: 56.7 mg/dL (ref >39.00)
LDL Cholesterol: 97 mg/dL (ref 0–99)
NonHDL: 114.15
Total CHOL/HDL Ratio: 3
Triglycerides: 87 mg/dL (ref 0.0–149.0)
VLDL: 17.4 mg/dL (ref 0.0–40.0)

## 2019-03-15 LAB — COMPREHENSIVE METABOLIC PANEL
ALT: 14 U/L (ref 0–35)
AST: 16 U/L (ref 0–37)
Albumin: 4.2 g/dL (ref 3.5–5.2)
Alkaline Phosphatase: 68 U/L (ref 39–117)
BUN: 10 mg/dL (ref 6–23)
CO2: 26 mEq/L (ref 19–32)
Calcium: 9.3 mg/dL (ref 8.4–10.5)
Chloride: 104 mEq/L (ref 96–112)
Creatinine, Ser: 0.6 mg/dL (ref 0.40–1.20)
GFR: 133.32 mL/min (ref >60.00)
Glucose, Bld: 82 mg/dL (ref 70–99)
Potassium: 3.9 mEq/L (ref 3.5–5.1)
Sodium: 139 mEq/L (ref 135–145)
Total Bilirubin: 0.3 mg/dL (ref 0.2–1.2)
Total Protein: 7.1 g/dL (ref 6.0–8.3)

## 2019-03-15 MED ORDER — NIFEDIPINE ER OSMOTIC RELEASE 60 MG PO TB24: 60.0000 mg | ORAL_TABLET | Freq: Every day | ORAL | 0 refills | Status: DC

## 2019-03-15 NOTE — Progress Notes (Signed)
Patient ID: Erica Ramos, female    DOB: Sep 14, 1975  Age: 43 y.o. MRN: [id number]    Subjective:  Subjective  HPI ELEXIS POLLAK presents for f/u bp   She has had poor eating habits lately and is ready to get back on track.    Review of Systems  Constitutional: Negative for appetite change, diaphoresis, fatigue and unexpected weight change.  Eyes: Negative for pain, redness and visual disturbance.  Respiratory: Negative for cough, chest tightness, shortness of breath and wheezing.   Cardiovascular: Negative for chest pain, palpitations and leg swelling.  Endocrine: Negative for cold intolerance, heat intolerance, polydipsia, polyphagia and polyuria.  Genitourinary: Negative for difficulty urinating, dysuria and frequency.  Neurological: Negative for dizziness, light-headedness, numbness and headaches.    History Past Medical History:  Diagnosis Date  . Anxiety   . Bronchitis    chronic - has flare every December  . Cervical polyp    hx of  . Environmental allergies   . Genital warts   . GERD (gastroesophageal reflux disease)    Per MD chart note 12/01/2010  . Heart murmur   . Hx of abnormal Pap smear 2012  . Hypertension   . Morbid obesity (HCC)    BMI 47.9  . Persistent headaches    worst during menstrual cycle  . PVC (premature ventricular contraction) 02/20/2015  . STD (sexually transmitted disease)    hx genital warts/?HSV  . Vaginal Pap smear, abnormal     She has a past surgical history that includes Wisdom tooth extraction; Colposcopy (2012); Cesarean section (N/A, 06/05/2016); and Cesarean section (N/A, 09/20/2017).   Her family history includes Alcohol abuse in her father; Diabetes in her father, maternal aunt, maternal grandmother, maternal uncle, and mother; Heart attack in her paternal grandmother; Hypertension in her maternal grandmother, mother, sister, and another family member; Obesity in an other family member; Seizures in her maternal aunt.She reports  that she has never smoked. She has never used smokeless tobacco. She reports current alcohol use of about 1.0 standard drinks of alcohol per week. She reports that she does not use drugs.  Current Outpatient Medications on File Prior to Visit  Medication Sig Dispense Refill  . acetaminophen (TYLENOL) 500 MG tablet Take 500-1,000 mg by mouth daily as needed for headache.     . calcium carbonate (TUMS - DOSED IN MG ELEMENTAL CALCIUM) 500 MG chewable tablet Chew 3-4 tablets by mouth daily as needed for indigestion or heartburn.    . cetirizine (ZYRTEC) 10 MG tablet Take 1 tablet (10 mg total) by mouth daily. 90 tablet 3  . Cholecalciferol (VITAMIN D3) 5000 units CAPS Take 5,000 Units by mouth daily.    . clindamycin (CLEOCIN) 150 MG capsule TK 2 CS PO TID FOR 7 DAYS    . GARLIC PO Take 1 capsule by mouth daily.    . hydrochlorothiazide (HYDRODIURIL) 25 MG tablet Take 1 tablet (25 mg total) by mouth daily. 90 tablet 0  . ibuprofen (ADVIL,MOTRIN) 600 MG tablet Take 1 tablet (600 mg total) by mouth every 6 (six) hours as needed. 30 tablet 1  . meloxicam (MOBIC) 7.5 MG tablet Take 1 tablet (7.5 mg total) by mouth daily. 30 tablet 0  . metoprolol succinate (TOPROL-XL) 100 MG 24 hr tablet Take 1 tablet (100 mg total) by mouth daily. Take with or immediately following a meal. 90 tablet 1  . omeprazole (PRILOSEC) 20 MG capsule Take 1 capsule (20 mg total) by mouth daily. 30 capsule 5  .  Prenatal Vit-Fe Fumarate-FA (MULTIVITAMIN-PRENATAL) 27-0.8 MG TABS tablet Take 1 tablet by mouth daily.     Marland Kitchen triamcinolone (NASACORT AQ) 55 MCG/ACT AERO nasal inhaler Place 2 sprays into the nose daily. (Patient taking differently: Place 1 spray into the nose daily. ) 1 Inhaler 12  . triamcinolone cream (KENALOG) 0.1 % Apply 1 application topically 2 (two) times daily as needed. 30 g 0   No current facility-administered medications on file prior to visit.      Objective:  Objective  Physical Exam Vitals signs and  nursing note reviewed.  Constitutional:      Appearance: She is well-developed.  HENT:     Head: Normocephalic and atraumatic.  Eyes:     Conjunctiva/sclera: Conjunctivae normal.  Neck:     Musculoskeletal: Normal range of motion and neck supple.     Thyroid: No thyromegaly.     Vascular: No carotid bruit or JVD.  Cardiovascular:     Rate and Rhythm: Normal rate and regular rhythm.     Heart sounds: Normal heart sounds. No murmur.  Pulmonary:     Effort: Pulmonary effort is normal. No respiratory distress.     Breath sounds: Normal breath sounds. No wheezing or rales.  Chest:     Chest wall: No tenderness.  Neurological:     Mental Status: She is alert and oriented to person, place, and time.    BP 130/88 (BP Location: Left Arm, Patient Position: Sitting, Cuff Size: Large)   Pulse 83   Temp 97.9 F (36.6 C) (Temporal)   Resp 18   Ht 5\' 2"  (1.575 m)   Wt 273 lb (123.8 kg)   SpO2 98%   BMI 49.93 kg/m  Wt Readings from Last 3 Encounters:  03/15/19 273 lb (123.8 kg)  11/08/18 271 lb (122.9 kg)  07/18/18 266 lb 12.8 oz (121 kg)     Lab Results  Component Value Date   WBC 8.6 11/08/2018   HGB 12.8 11/08/2018   HCT 39.5 11/08/2018   PLT 300.0 11/08/2018   GLUCOSE 82 03/15/2019   CHOL 171 03/15/2019   TRIG 87.0 03/15/2019   HDL 56.70 03/15/2019   LDLCALC 97 03/15/2019   ALT 14 03/15/2019   AST 16 03/15/2019   NA 139 03/15/2019   K 3.9 03/15/2019   CL 104 03/15/2019   CREATININE 0.60 03/15/2019   BUN 10 03/15/2019   CO2 26 03/15/2019   TSH 2.19 08/03/2018   HGBA1C 6.1 11/09/2018   MICROALBUR <0.7 10/30/2014    No results found.   Assessment & Plan:  Plan  I am having Lubertha Basque. Streb maintain her cetirizine, omeprazole, multivitamin-prenatal, triamcinolone, acetaminophen, Vitamin D3, GARLIC PO, calcium carbonate, ibuprofen, clindamycin, metoprolol succinate, meloxicam, triamcinolone cream, hydrochlorothiazide, and NIFEdipine.  Meds ordered this encounter   Medications  . NIFEdipine (PROCARDIA XL/NIFEDICAL XL) 60 MG 24 hr tablet    Sig: Take 1 tablet (60 mg total) by mouth daily.    Dispense:  30 tablet    Refill:  0    Problem List Items Addressed This Visit      Unprioritized   HTN (hypertension)   Relevant Medications   NIFEdipine (PROCARDIA XL/NIFEDICAL XL) 60 MG 24 hr tablet   Other Relevant Orders   Lipid panel (Completed)   Comprehensive metabolic panel (Completed)    running borderline --- - pt will get her diet back on track   Follow-up: Return in about 3 months (around 06/15/2019), or if symptoms worsen or fail to improve,  for hypertension.  Ann Held, DO

## 2019-03-15 NOTE — Patient Instructions (Signed)

## 2019-04-18 ENCOUNTER — Other Ambulatory Visit: Payer: BC Managed Care – PPO

## 2019-04-18 DIAGNOSIS — Z20828 Contact with and (suspected) exposure to other viral communicable diseases: Secondary | ICD-10-CM | POA: Diagnosis not present

## 2019-05-27 ENCOUNTER — Other Ambulatory Visit: Payer: Self-pay | Admitting: *Deleted

## 2019-05-27 DIAGNOSIS — I10 Essential (primary) hypertension: Secondary | ICD-10-CM

## 2019-05-27 MED ORDER — HYDROCHLOROTHIAZIDE 25 MG PO TABS: 25.0000 mg | ORAL_TABLET | Freq: Every day | ORAL | 0 refills | Status: DC

## 2019-06-13 ENCOUNTER — Ambulatory Visit: Payer: BC Managed Care – PPO | Admitting: Family Medicine

## 2019-08-06 ENCOUNTER — Telehealth: Payer: Self-pay | Admitting: Family Medicine

## 2019-08-06 NOTE — Telephone Encounter (Signed)
Pt scheduled an appt with Fransico Setters but I seen there wasn't permission for her to transfer to our office from [facility]. Pt said this office is closer and would like permission to transfer. Is this ok?

## 2019-08-06 NOTE — Telephone Encounter (Signed)
Pt is scheduled 08/09/19 @ 10am with Amedeo Kinsman

## 2019-08-06 NOTE — Telephone Encounter (Signed)
Fine with me

## 2019-08-09 ENCOUNTER — Other Ambulatory Visit: Payer: Self-pay

## 2019-08-09 ENCOUNTER — Ambulatory Visit: Payer: Managed Care, Other (non HMO) | Admitting: Nurse Practitioner

## 2019-08-09 ENCOUNTER — Encounter: Payer: Self-pay | Admitting: Nurse Practitioner

## 2019-08-09 VITALS — BP 136/92 | HR 76 | Temp 96.9°F | Resp 15 | Ht 62.0 in | Wt 277.2 lb

## 2019-08-09 DIAGNOSIS — F419 Anxiety disorder, unspecified: Secondary | ICD-10-CM | POA: Diagnosis not present

## 2019-08-09 DIAGNOSIS — I10 Essential (primary) hypertension: Secondary | ICD-10-CM | POA: Diagnosis not present

## 2019-08-09 MED ORDER — SERTRALINE HCL 50 MG PO TABS: 50.0000 mg | ORAL_TABLET | Freq: Every day | ORAL | 0 refills | Status: DC

## 2019-08-09 NOTE — Patient Instructions (Addendum)
It was great tot meet you today.   Please go to the lab.   Please keep home BP readings in am and pm and bring in the record.  Begin the sertraline 50 mg and take 1/2 tablet daily for 1 week and then go up to 1 tablet daily to follow.  Please see me back in the office in 1 month.   Continue to work on healthy diet and lifestyle.  DASH Eating Plan DASH stands for "Dietary Approaches to Stop Hypertension." The DASH eating plan is a healthy eating plan that has been shown to reduce high blood pressure (hypertension). It may also reduce your risk for type 2 diabetes, heart disease, and stroke. The DASH eating plan may also help with weight loss. What are tips for following this plan?  General guidelines  Avoid eating more than 2,300 mg (milligrams) of salt (sodium) a day. If you have hypertension, you may need to reduce your sodium intake to 1,500 mg a day.  Limit alcohol intake to no more than 1 drink a day for nonpregnant women and 2 drinks a day for men. One drink equals 12 oz of beer, 5 oz of wine, or 1 oz of hard liquor.  Work with your health care provider to maintain a healthy body weight or to lose weight. Ask what an ideal weight is for you.  Get at least 30 minutes of exercise that causes your heart to beat faster (aerobic exercise) most days of the week. Activities may include walking, swimming, or biking.  Work with your health care provider or diet and nutrition specialist (dietitian) to adjust your eating plan to your individual calorie needs. Reading food labels   Check food labels for the amount of sodium per serving. Choose foods with less than 5 percent of the Daily Value of sodium. Generally, foods with less than 300 mg of sodium per serving fit into this eating plan.  To find whole grains, look for the word "whole" as the first word in the ingredient list. Shopping  Buy products labeled as "low-sodium" or "no salt added."  Buy fresh foods. Avoid canned foods and  premade or frozen meals. Cooking  Avoid adding salt when cooking. Use salt-free seasonings or herbs instead of table salt or sea salt. Check with your health care provider or pharmacist before using salt substitutes.  Do not fry foods. Cook foods using healthy methods such as baking, boiling, grilling, and broiling instead.  Cook with heart-healthy oils, such as olive, canola, soybean, or sunflower oil. Meal planning  Eat a balanced diet that includes: ? 5 or more servings of fruits and vegetables each day. At each meal, try to fill half of your plate with fruits and vegetables. ? Up to 6-8 servings of whole grains each day. ? Less than 6 oz of lean meat, poultry, or fish each day. A 3-oz serving of meat is about the same size as a deck of cards. One egg equals 1 oz. ? 2 servings of low-fat dairy each day. ? A serving of nuts, seeds, or beans 5 times each week. ? Heart-healthy fats. Healthy fats called Omega-3 fatty acids are found in foods such as flaxseeds and coldwater fish, like sardines, salmon, and mackerel.  Limit how much you eat of the following: ? Canned or prepackaged foods. ? Food that is high in trans fat, such as fried foods. ? Food that is high in saturated fat, such as fatty meat. ? Sweets, desserts, sugary drinks, and other  foods with added sugar. ? Full-fat dairy products.  Do not salt foods before eating.  Try to eat at least 2 vegetarian meals each week.  Eat more home-cooked food and less restaurant, buffet, and fast food.  When eating at a restaurant, ask that your food be prepared with less salt or no salt, if possible. What foods are recommended? The items listed may not be a complete list. Talk with your dietitian about what dietary choices are best for you. Grains Whole-grain or whole-wheat bread. Whole-grain or whole-wheat pasta. Brown rice. Orpah Cobb. Bulgur. Whole-grain and low-sodium cereals. Pita bread. Low-fat, low-sodium crackers. Whole-wheat  flour tortillas. Vegetables Fresh or frozen vegetables (raw, steamed, roasted, or grilled). Low-sodium or reduced-sodium tomato and vegetable juice. Low-sodium or reduced-sodium tomato sauce and tomato paste. Low-sodium or reduced-sodium canned vegetables. Fruits All fresh, dried, or frozen fruit. Canned fruit in natural juice (without added sugar). Meat and other protein foods Skinless chicken or [place]. Ground chicken or [place]. Pork with fat trimmed off. Fish and seafood. Egg whites. Dried beans, peas, or lentils. Unsalted nuts, nut butters, and seeds. Unsalted canned beans. Lean cuts of beef with fat trimmed off. Low-sodium, lean deli meat. Dairy Low-fat (1%) or fat-free (skim) milk. Fat-free, low-fat, or reduced-fat cheeses. Nonfat, low-sodium ricotta or cottage cheese. Low-fat or nonfat yogurt. Low-fat, low-sodium cheese. Fats and oils Soft margarine without trans fats. Vegetable oil. Low-fat, reduced-fat, or light mayonnaise and salad dressings (reduced-sodium). Canola, safflower, olive, soybean, and sunflower oils. Avocado. Seasoning and other foods Herbs. Spices. Seasoning mixes without salt. Unsalted popcorn and pretzels. Fat-free sweets. What foods are not recommended? The items listed may not be a complete list. Talk with your dietitian about what dietary choices are best for you. Grains Baked goods made with fat, such as croissants, muffins, or some breads. Dry pasta or rice meal packs. Vegetables Creamed or fried vegetables. Vegetables in a cheese sauce. Regular canned vegetables (not low-sodium or reduced-sodium). Regular canned tomato sauce and paste (not low-sodium or reduced-sodium). Regular tomato and vegetable juice (not low-sodium or reduced-sodium). Rosita Fire. Olives. Fruits Canned fruit in a light or heavy syrup. Fried fruit. Fruit in cream or butter sauce. Meat and other protein foods Fatty cuts of meat. Ribs. Fried meat. Tomasa Blase. Sausage. Bologna and other processed lunch  meats. Salami. Fatback. Hotdogs. Bratwurst. Salted nuts and seeds. Canned beans with added salt. Canned or smoked fish. Whole eggs or egg yolks. Chicken or [place] with skin. Dairy Whole or 2% milk, cream, and half-and-half. Whole or full-fat cream cheese. Whole-fat or sweetened yogurt. Full-fat cheese. Nondairy creamers. Whipped toppings. Processed cheese and cheese spreads. Fats and oils Butter. Stick margarine. Lard. Shortening. Ghee. Bacon fat. Tropical oils, such as coconut, palm kernel, or palm oil. Seasoning and other foods Salted popcorn and pretzels. Onion salt, garlic salt, seasoned salt, table salt, and sea salt. Worcestershire sauce. Tartar sauce. Barbecue sauce. Teriyaki sauce. Soy sauce, including reduced-sodium. Steak sauce. Canned and packaged gravies. Fish sauce. Oyster sauce. Cocktail sauce. Horseradish that you find on the shelf. Ketchup. Mustard. Meat flavorings and tenderizers. Bouillon cubes. Hot sauce and Tabasco sauce. Premade or packaged marinades. Premade or packaged taco seasonings. Relishes. Regular salad dressings. Where to find more information:  National Heart, Lung, and Blood Institute: PopSteam.is  American Heart Association: www.heart.org Summary  The DASH eating plan is a healthy eating plan that has been shown to reduce high blood pressure (hypertension). It may also reduce your risk for type 2 diabetes, heart disease, and stroke.  With  the DASH eating plan, you should limit salt (sodium) intake to 2,300 mg a day. If you have hypertension, you may need to reduce your sodium intake to 1,500 mg a day.  When on the DASH eating plan, aim to eat more fresh fruits and vegetables, whole grains, lean proteins, low-fat dairy, and heart-healthy fats.  Work with your health care provider or diet and nutrition specialist (dietitian) to adjust your eating plan to your individual calorie needs. This information is not intended to replace advice given to you by your  health care provider. Make sure you discuss any questions you have with your health care provider. Document Revised: 03/31/2017 Document Reviewed: 04/11/2016 Elsevier Patient Education  2020 [employer].  Calorie Counting for [employer] Calories are units of energy. Your body needs a certain amount of calories from food to keep you going throughout the day. When you eat more calories than your body needs, your body stores the extra calories as fat. When you eat fewer calories than your body needs, your body burns fat to get the energy it needs. Calorie counting means keeping track of how many calories you eat and drink each day. Calorie counting can be helpful if you need to lose weight. If you make sure to eat fewer calories than your body needs, you should lose weight. Ask your health care provider what a healthy weight is for you. For calorie counting to work, you will need to eat the right number of calories in a day in order to lose a healthy amount of weight per week. A dietitian can help you determine how many calories you need in a day and will give you suggestions on how to reach your calorie goal.  A healthy amount of weight to lose per week is usually 1-2 lb (0.5-0.9 kg). This usually means that your daily calorie intake should be reduced by 500-750 calories.  Eating 1,200 - 1,500 calories per day can help most women lose weight.  Eating 1,500 - 1,800 calories per day can help most men lose weight. What is my plan? My goal is to have __________ calories per day. If I have this many calories per day, I should lose around __________ pounds per week. What do I need to know about calorie counting? In order to meet your daily calorie goal, you will need to:  Find out how many calories are in each food you would like to eat. Try to do this before you eat.  Decide how much of the food you plan to eat.  Write down what you ate and how many calories it had. Doing this is called keeping a  food log. To successfully lose weight, it is important to balance calorie counting with a healthy lifestyle that includes regular activity. Aim for 150 minutes of moderate exercise (such as walking) or 75 minutes of vigorous exercise (such as running) each week. Where do I find calorie information?  The number of calories in a food can be found on a Nutrition Facts label. If a food does not have a Nutrition Facts label, try to look up the calories online or ask your dietitian for help. Remember that calories are listed per serving. If you choose to have more than one serving of a food, you will have to multiply the calories per serving by the amount of servings you plan to eat. For example, the label on a package of bread might say that a serving size is 1 slice and that there  are 90 calories in a serving. If you eat 1 slice, you will have eaten 90 calories. If you eat 2 slices, you will have eaten 180 calories. How do I keep a food log? Immediately after each meal, record the following information in your food log:  What you ate. Don't forget to include toppings, sauces, and other extras on the food.  How much you ate. This can be measured in cups, ounces, or number of items.  How many calories each food and drink had.  The total number of calories in the meal. Keep your food log near you, such as in a small notebook in your pocket, or use a mobile app or website. Some programs will calculate calories for you and show you how many calories you have left for the day to meet your goal. What are some calorie counting tips?   Use your calories on foods and drinks that will fill you up and not leave you hungry: ? Some examples of foods that fill you up are nuts and nut butters, vegetables, lean proteins, and high-fiber foods like whole grains. High-fiber foods are foods with more than 5 g fiber per serving. ? Drinks such as sodas, specialty coffee drinks, alcohol, and juices have a lot of calories,  yet do not fill you up.  Eat nutritious foods and avoid empty calories. Empty calories are calories you get from foods or beverages that do not have many vitamins or protein, such as candy, sweets, and soda. It is better to have a nutritious high-calorie food (such as an avocado) than a food with few nutrients (such as a bag of chips).  Know how many calories are in the foods you eat most often. This will help you calculate calorie counts faster.  Pay attention to calories in drinks. Low-calorie drinks include water and unsweetened drinks.  Pay attention to nutrition labels for "low fat" or "fat free" foods. These foods sometimes have the same amount of calories or more calories than the full fat versions. They also often have added sugar, starch, or salt, to make up for flavor that was removed with the fat.  Find a way of tracking calories that works for you. Get creative. Try different apps or programs if writing down calories does not work for you. What are some portion control tips?  Know how many calories are in a serving. This will help you know how many servings of a certain food you can have.  Use a measuring cup to measure serving sizes. You could also try weighing out portions on a kitchen scale. With time, you will be able to estimate serving sizes for some foods.  Take some time to put servings of different foods on your favorite plates, bowls, and cups so you know what a serving looks like.  Try not to eat straight from a bag or box. Doing this can lead to overeating. Put the amount you would like to eat in a cup or on a plate to make sure you are eating the right portion.  Use smaller plates, glasses, and bowls to prevent overeating.  Try not to multitask (for example, watch TV or use your computer) while eating. If it is time to eat, sit down at a table and enjoy your food. This will help you to know when you are full. It will also help you to be aware of what you are eating and  how much you are eating. What are tips for following this plan?  Reading food labels  Check the calorie count compared to the serving size. The serving size may be smaller than what you are used to eating.  Check the source of the calories. Make sure the food you are eating is high in vitamins and protein and low in saturated and trans fats. Shopping  Read nutrition labels while you shop. This will help you make healthy decisions before you decide to purchase your food.  Make a grocery list and stick to it. Cooking  Try to cook your favorite foods in a healthier way. For example, try baking instead of frying.  Use low-fat dairy products. Meal planning  Use more fruits and vegetables. Half of your plate should be fruits and vegetables.  Include lean proteins like poultry and fish. How do I count calories when eating out?  Ask for smaller portion sizes.  Consider sharing an entree and sides instead of getting your own entree.  If you get your own entree, eat only half. Ask for a box at the beginning of your meal and put the rest of your entree in it so you are not tempted to eat it.  If calories are listed on the menu, choose the lower calorie options.  Choose dishes that include vegetables, fruits, whole grains, low-fat dairy products, and lean protein.  Choose items that are boiled, broiled, grilled, or steamed. Stay away from items that are buttered, battered, fried, or served with cream sauce. Items labeled "crispy" are usually fried, unless stated otherwise.  Choose water, low-fat milk, unsweetened iced tea, or other drinks without added sugar. If you want an alcoholic beverage, choose a lower calorie option such as a glass of wine or light beer.  Ask for dressings, sauces, and syrups on the side. These are usually high in calories, so you should limit the amount you eat.  If you want a salad, choose a garden salad and ask for grilled meats. Avoid extra toppings like bacon,  cheese, or fried items. Ask for the dressing on the side, or ask for olive oil and vinegar or lemon to use as dressing.  Estimate how many servings of a food you are given. For example, a serving of cooked rice is  cup or about the size of half a baseball. Knowing serving sizes will help you be aware of how much food you are eating at restaurants. The list below tells you how big or small some common portion sizes are based on everyday objects: ? 1 oz--4 stacked dice. ? 3 oz--1 deck of cards. ? 1 tsp--1 die. ? 1 Tbsp-- a ping-pong ball. ? 2 Tbsp--1 ping-pong ball. ?  cup-- baseball. ? 1 cup--1 baseball. Summary  Calorie counting means keeping track of how many calories you eat and drink each day. If you eat fewer calories than your body needs, you should lose weight.  A healthy amount of weight to lose per week is usually 1-2 lb (0.5-0.9 kg). This usually means reducing your daily calorie intake by 500-750 calories.  The number of calories in a food can be found on a Nutrition Facts label. If a food does not have a Nutrition Facts label, try to look up the calories online or ask your dietitian for help.  Use your calories on foods and drinks that will fill you up, and not on foods and drinks that will leave you hungry.  Use smaller plates, glasses, and bowls to prevent overeating. This information is not intended to replace advice given to you  by your health care provider. Make sure you discuss any questions you have with your health care provider. Document Revised: 01/05/2018 Document Reviewed: 03/18/2016 Elsevier Patient Education  2020 Else[employer] Inc.  DASH Eating Plan DASH stands for "Dietary Approaches to Stop Hypertension." The DASH eating plan is a healthy eating plan that has been shown to reduce high blood pressure (hypertension). It may also reduce your risk for type 2 diabetes, heart disease, and stroke. The DASH eating plan may also help with weight loss. What are tips for  following this plan?  General guidelines  Avoid eating more than 2,300 mg (milligrams) of salt (sodium) a day. If you have hypertension, you may need to reduce your sodium intake to 1,500 mg a day.  Limit alcohol intake to no more than 1 drink a day for nonpregnant women and 2 drinks a day for men. One drink equals 12 oz of beer, 5 oz of wine, or 1 oz of hard liquor.  Work with your health care provider to maintain a healthy body weight or to lose weight. Ask what an ideal weight is for you.  Get at least 30 minutes of exercise that causes your heart to beat faster (aerobic exercise) most days of the week. Activities may include walking, swimming, or biking.  Work with your health care provider or diet and nutrition specialist (dietitian) to adjust your eating plan to your individual calorie needs. Reading food labels   Check food labels for the amount of sodium per serving. Choose foods with less than 5 percent of the Daily Value of sodium. Generally, foods with less than 300 mg of sodium per serving fit into this eating plan.  To find whole grains, look for the word "whole" as the first word in the ingredient list. Shopping  Buy products labeled as "low-sodium" or "no salt added."  Buy fresh foods. Avoid canned foods and premade or frozen meals. Cooking  Avoid adding salt when cooking. Use salt-free seasonings or herbs instead of table salt or sea salt. Check with your health care provider or pharmacist before using salt substitutes.  Do not fry foods. Cook foods using healthy methods such as baking, boiling, grilling, and broiling instead.  Cook with heart-healthy oils, such as olive, canola, soybean, or sunflower oil. Meal planning  Eat a balanced diet that includes: ? 5 or more servings of fruits and vegetables each day. At each meal, try to fill half of your plate with fruits and vegetables. ? Up to 6-8 servings of whole grains each day. ? Less than 6 oz of lean meat,  poultry, or fish each day. A 3-oz serving of meat is about the same size as a deck of cards. One egg equals 1 oz. ? 2 servings of low-fat dairy each day. ? A serving of nuts, seeds, or beans 5 times each week. ? Heart-healthy fats. Healthy fats called Omega-3 fatty acids are found in foods such as flaxseeds and coldwater fish, like sardines, salmon, and mackerel.  Limit how much you eat of the following: ? Canned or prepackaged foods. ? Food that is high in trans fat, such as fried foods. ? Food that is high in saturated fat, such as fatty meat. ? Sweets, desserts, sugary drinks, and other foods with added sugar. ? Full-fat dairy products.  Do not salt foods before eating.  Try to eat at least 2 vegetarian meals each week.  Eat more home-cooked food and less restaurant, buffet, and fast food.  When eating at a  restaurant, ask that your food be prepared with less salt or no salt, if possible. What foods are recommended? The items listed may not be a complete list. Talk with your dietitian about what dietary choices are best for you. Grains Whole-grain or whole-wheat bread. Whole-grain or whole-wheat pasta. Brown rice. Orpah Cobb. Bulgur. Whole-grain and low-sodium cereals. Pita bread. Low-fat, low-sodium crackers. Whole-wheat flour tortillas. Vegetables Fresh or frozen vegetables (raw, steamed, roasted, or grilled). Low-sodium or reduced-sodium tomato and vegetable juice. Low-sodium or reduced-sodium tomato sauce and tomato paste. Low-sodium or reduced-sodium canned vegetables. Fruits All fresh, dried, or frozen fruit. Canned fruit in natural juice (without added sugar). Meat and other protein foods Skinless chicken or [place]. Ground chicken or [place]. Pork with fat trimmed off. Fish and seafood. Egg whites. Dried beans, peas, or lentils. Unsalted nuts, nut butters, and seeds. Unsalted canned beans. Lean cuts of beef with fat trimmed off. Low-sodium, lean deli meat. Dairy Low-fat  (1%) or fat-free (skim) milk. Fat-free, low-fat, or reduced-fat cheeses. Nonfat, low-sodium ricotta or cottage cheese. Low-fat or nonfat yogurt. Low-fat, low-sodium cheese. Fats and oils Soft margarine without trans fats. Vegetable oil. Low-fat, reduced-fat, or light mayonnaise and salad dressings (reduced-sodium). Canola, safflower, olive, soybean, and sunflower oils. Avocado. Seasoning and other foods Herbs. Spices. Seasoning mixes without salt. Unsalted popcorn and pretzels. Fat-free sweets. What foods are not recommended? The items listed may not be a complete list. Talk with your dietitian about what dietary choices are best for you. Grains Baked goods made with fat, such as croissants, muffins, or some breads. Dry pasta or rice meal packs. Vegetables Creamed or fried vegetables. Vegetables in a cheese sauce. Regular canned vegetables (not low-sodium or reduced-sodium). Regular canned tomato sauce and paste (not low-sodium or reduced-sodium). Regular tomato and vegetable juice (not low-sodium or reduced-sodium). Rosita Fire. Olives. Fruits Canned fruit in a light or heavy syrup. Fried fruit. Fruit in cream or butter sauce. Meat and other protein foods Fatty cuts of meat. Ribs. Fried meat. Tomasa Blase. Sausage. Bologna and other processed lunch meats. Salami. Fatback. Hotdogs. Bratwurst. Salted nuts and seeds. Canned beans with added salt. Canned or smoked fish. Whole eggs or egg yolks. Chicken or [place] with skin. Dairy Whole or 2% milk, cream, and half-and-half. Whole or full-fat cream cheese. Whole-fat or sweetened yogurt. Full-fat cheese. Nondairy creamers. Whipped toppings. Processed cheese and cheese spreads. Fats and oils Butter. Stick margarine. Lard. Shortening. Ghee. Bacon fat. Tropical oils, such as coconut, palm kernel, or palm oil. Seasoning and other foods Salted popcorn and pretzels. Onion salt, garlic salt, seasoned salt, table salt, and sea salt. Worcestershire sauce. Tartar sauce.  Barbecue sauce. Teriyaki sauce. Soy sauce, including reduced-sodium. Steak sauce. Canned and packaged gravies. Fish sauce. Oyster sauce. Cocktail sauce. Horseradish that you find on the shelf. Ketchup. Mustard. Meat flavorings and tenderizers. Bouillon cubes. Hot sauce and Tabasco sauce. Premade or packaged marinades. Premade or packaged taco seasonings. Relishes. Regular salad dressings. Where to find more information:  National Heart, Lung, and Blood Institute: PopSteam.is  American Heart Association: www.heart.org Summary  The DASH eating plan is a healthy eating plan that has been shown to reduce high blood pressure (hypertension). It may also reduce your risk for type 2 diabetes, heart disease, and stroke.  With the DASH eating plan, you should limit salt (sodium) intake to 2,300 mg a day. If you have hypertension, you may need to reduce your sodium intake to 1,500 mg a day.  When on the DASH eating plan, aim to eat more  fresh fruits and vegetables, whole grains, lean proteins, low-fat dairy, and heart-healthy fats.  Work with your health care provider or diet and nutrition specialist (dietitian) to adjust your eating plan to your individual calorie needs. This information is not intended to replace advice given to you by your health care provider. Make sure you discuss any questions you have with your health care provider. Document Revised: 03/31/2017 Document Reviewed: 04/11/2016 Elsevier Patient Education  2020 [employer].

## 2019-08-09 NOTE — Addendum Note (Signed)
Addended by: Amedeo Kinsman A on: 08/09/2019 07:24 AM   Modules accepted: Orders

## 2019-08-09 NOTE — Progress Notes (Signed)
New Patient Office Visit  Subjective:  Patient ID: Erica Ramos, female    DOB: 1975/11/12  Age: 44 y.o. MRN: [id number]  CC:  Chief Complaint  Patient presents with  . Establish Care    transfer of care    HPI Erica Ramos presents to establish care with a new primary care provider as she is no longer working in [place]. 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 She has 2 main concerns today:   1. HTN:  Reports taking her BP meds daily as directed: Toprol XL 100 mg daily, HCTZ 25 mg daily, nifedipine XL 60 mg. She has stress in her life and has not been able to get her diet back on track. She knows this is keeping her BP elevated. She would also like to start on a whole new BP regimen with less pill burden. Her ultimate goal is to get her stress level and weight down and eventually come off all of her BP meds.  She denies any chest pain, palpitations, shortness of breath, dizziness or lightheadedness.  She does have some ankle swelling at times with walking, but not currently. The HCTZ helps.   Chart review shows she took lisinopril/HCTZ 20-12.5 mg 2 tabs daily in 2015 w BP 124/86-HR 74, Wt 271 lb with BMI 48. Since then, she has had 2 pregnancies and BP regimen was changed.   BP Readings from Last 3 Encounters:  08/09/19 (!) 136/92  03/15/19 130/88  01/11/19 (!) 147/86    2. ANXIETY: She would like to go back on sertraline.  Dr. Landry Mellow gave that to her and it made her feel in-balance. She is having trouble managing anxiety.  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  She is finding her way, but acknowledges that she has had some really stressful times and  becomes tearful when talking about it today. GAD-7: 5, PHQ-9: 7.  HEALTH PREVENTION: Immunizations: Tdap: 11/08/2012 Covid: No Flu: No  Diet: Not healthy sometimes- time management, eating out Exercise:   Colonoscopy: Dexa: NA Pap Smear: 03/13/2017- neg Mammogram: 10/18/2018  Vision: Dental:  Contraception: She is not using BC now- and would use condoms .     Past Medical History:  Diagnosis Date  . Anxiety   . Bronchitis    chronic - has flare every December  . Cervical polyp    hx of  . Environmental allergies   . Genital warts   . GERD (gastroesophageal reflux disease)    Per MD chart note 12/01/2010  . Heart murmur   . Hx of abnormal Pap smear 2012  . Hypertension   . Morbid obesity (HCC)    BMI 47.9  . Persistent headaches    worst during menstrual cycle  . PVC (premature ventricular contraction) 02/20/2015  . STD (sexually transmitted disease)    hx genital warts/?HSV  . Vaginal Pap smear, abnormal     Past Surgical History:  Procedure Laterality Date  . CESAREAN SECTION N/A 06/05/2016   Procedure: CESAREAN SECTION;  Surgeon: Waymon Amato, MD;  Location: [facility];  Service: Obstetrics;  Laterality: N/A;  . CESAREAN SECTION N/A 09/20/2017   Procedure: CESAREAN SECTION;  Surgeon: Christophe Louis, MD;  Location: [facility];  Service: Obstetrics;  Laterality: N/A;  EDD 10/02/17  . COLPOSCOPY  2012   no treatment to cervix--pap smears reverted to normal  .  WISDOM TOOTH EXTRACTION      Family History  Problem Relation Age of Onset  . Diabetes Mother   . Hypertension Mother        Under control  . Diabetes Maternal Aunt   . Seizures Maternal Aunt   . Diabetes Maternal Grandmother   . Hypertension Maternal Grandmother   . Alcohol abuse Father   . Diabetes Father   . Hypertension Sister   . Diabetes Maternal Uncle   . Hypertension Other   . Obesity Other   . Heart attack Paternal Grandmother   . COPD Neg Hx     Social History   Socioeconomic History  . Marital status: Single    Spouse name: Not on file  . Number of children: 0  . Years of education: Not on file  . Highest education level: Not on file  Occupational History    Employer: [employer]  Tobacco Use  . Smoking status: Never Smoker  . Smokeless tobacco: Never Used  Substance and Sexual Activity  . Alcohol use: Yes    Alcohol/week: 1.0 standard drinks    Types: 1 Standard drinks or equivalent per week    Comment: Occasionally; less than 1-2 a week  . Drug use: No  . Sexual activity: Yes    Partners: Male    Birth control/protection: Condom    Comment: condoms sometimes  Other Topics Concern  . Not on file  Social History Narrative   Exercise--  2-3 x a week for about 30 min- 60 min   Social Determinants of Health   Financial Resource Strain:   . Difficulty of Paying Living Expenses:   Food Insecurity:   . Worried About Charity fundraiser in the Last Year:   . Arboriculturist in the Last Year:   Transportation Needs:   . Film/video editor (Medical):   Marland Kitchen Lack of Transportation (Non-Medical):   Physical Activity:   . Days of Exercise per Week:   . Minutes of Exercise per Session:   Stress:   . Feeling of Stress :   Social Connections:   . Frequency of Communication with Friends and Family:   . Frequency of Social Gatherings with Friends and Family:   . Attends Religious Services:   . Active Member of Clubs or Organizations:   . Attends Archivist Meetings:   Marland Kitchen Marital Status:   Intimate Partner Violence:   . Fear of Current or Ex-Partner:   . Emotionally Abused:   Marland Kitchen Physically Abused:   . Sexually Abused:     ROS Review of Systems  Constitutional: Negative for chills and fever.  HENT: Negative for congestion and sore throat.   Eyes: Negative.   Respiratory: Negative for cough and shortness of breath.   Cardiovascular: Negative for chest pain and palpitations.  Gastrointestinal: Negative for abdominal pain and diarrhea.  Endocrine: Negative.   Genitourinary: Negative for dysuria and frequency.  Musculoskeletal: Negative.   Skin: Positive for rash.       Mild scattered eczema- uses creams.  Allergic/Immunologic:  Negative.   Neurological: Negative.   Hematological: Negative.   Psychiatric/Behavioral:       Positive anxiety/depression- See HPI. No SI/HI.     Objective:   Today's Vitals: BP (!) 136/92 (BP Location: Left Arm, Patient Position: Sitting, Cuff Size: Large)   Pulse 76   Temp (!) 96.9 F (36.1 C) (Temporal)   Resp 15   Ht '5\' 2"'$  (1.575 m)  Wt 277 lb 3.2 oz (125.7 kg)   SpO2 99%   BMI 50.70 kg/m   Physical Exam Vitals reviewed.  Constitutional:      Appearance: Normal appearance.  HENT:     Head: Normocephalic.  Eyes:     Extraocular Movements: Extraocular movements intact.     Pupils: Pupils are equal, round, and reactive to light.  Cardiovascular:     Rate and Rhythm: Normal rate and regular rhythm.     Pulses: Normal pulses.     Heart sounds: Normal heart sounds. No murmur.  Pulmonary:     Effort: Pulmonary effort is normal.     Breath sounds: Normal breath sounds.  Abdominal:     Palpations: Abdomen is soft.     Tenderness: There is no abdominal tenderness.  Musculoskeletal:        General: Normal range of motion.  Skin:    General: Skin is warm and dry.  Neurological:     General: No focal deficit present.     Mental Status: She is alert and oriented to person, place, and time.  Psychiatric:        Behavior: Behavior normal.        Thought Content: Thought content normal.        Judgment: Judgment normal.     Comments: Becomes tearful when talking about  high stress level, regains composure quickly. Maintains good eye contact, relaxed and sharing thoughts freely. She reports she has no safety concerns at home.     Assessment & Plan:   Problem List Items Addressed This Visit      Cardiovascular and Mediastinum   HTN (hypertension) - Primary   Relevant Orders   Comp Met (CMET) (Completed)   Urine Microalbumin w/creat. ratio   HgB A1c (Completed)     Other   Anxiety   Relevant Medications   sertraline (ZOLOFT) 50 MG tablet   Other Relevant Orders    TSH (Completed)      Outpatient Encounter Medications as of 08/09/2019  Medication Sig  . acetaminophen (TYLENOL) 500 MG tablet Take 500-1,000 mg by mouth daily as needed for headache.   . calcium carbonate (TUMS - DOSED IN MG ELEMENTAL CALCIUM) 500 MG chewable tablet Chew 3-4 tablets by mouth daily as needed for indigestion or heartburn.  . cetirizine (ZYRTEC) 10 MG tablet Take 1 tablet (10 mg total) by mouth daily.  . Cholecalciferol (VITAMIN D3) 5000 units CAPS Take 5,000 Units by mouth daily.  Marland Kitchen GARLIC PO Take 1 capsule by mouth daily.  . hydrochlorothiazide (HYDRODIURIL) 25 MG tablet Take 1 tablet (25 mg total) by mouth daily.  . metoprolol succinate (TOPROL-XL) 100 MG 24 hr tablet Take 1 tablet (100 mg total) by mouth daily. Take with or immediately following a meal.  . NIFEdipine (PROCARDIA XL/NIFEDICAL XL) 60 MG 24 hr tablet Take 1 tablet (60 mg total) by mouth daily.  Marland Kitchen omeprazole (PRILOSEC) 20 MG capsule Take 1 capsule (20 mg total) by mouth daily.  . Prenatal Vit-Fe Fumarate-FA (MULTIVITAMIN-PRENATAL) 27-0.8 MG TABS tablet Take 1 tablet by mouth daily.   Marland Kitchen triamcinolone (NASACORT AQ) 55 MCG/ACT AERO nasal inhaler Place 2 sprays into the nose daily. (Patient taking differently: Place 1 spray into the nose daily. )  . triamcinolone cream (KENALOG) 0.1 % Apply 1 application topically 2 (two) times daily as needed.  . [DISCONTINUED] ibuprofen (ADVIL,MOTRIN) 600 MG tablet Take 1 tablet (600 mg total) by mouth every 6 (six) hours as needed.  . sertraline (  ZOLOFT) 50 MG tablet Take 1 tablet (50 mg total) by mouth daily. Take 1/2 tab daily for 1 week and then go to 1 tab daily if tolerated.  . [DISCONTINUED] clindamycin (CLEOCIN) 150 MG capsule TK 2 CS PO TID FOR 7 DAYS  . [DISCONTINUED] meloxicam (MOBIC) 7.5 MG tablet Take 1 tablet (7.5 mg total) by mouth daily. (Patient not taking: Reported on 08/09/2019)   No facility-administered encounter medications on file as of 08/09/2019.   Please  go to the lab.   Please keep home BP readings in am and pm and bring in the record.  Begin the sertraline 50 mg and take 1/2 tablet daily for 1 week and then go up to 1 tablet daily to follow.  We talked today about healthy low salt and calorie reduced diet/exercise and mental health. I gave her some meal prep tips. Her focus now is getting the anxiety under control and maybe her BP will improve.  We discussed the goal of blood pressure is ideally 120/80 range.  We discussed that her blood pressure has been quite elevated in the past, and she can have marked improvement in her overall health with diet and exercise but she will likely still need to stay on some blood pressure medicine.  She voices understanding. We can work on decreasing her pill burden and adding changing her regimen at the next visits.  Please see me back in the office in 1 month.  Will need PAP and MAMMO set up. Immunization records Updated.   A total of 50 minutes of face to face time was spent with patient more than half of which was spent in counselling and coordination of care.  This visit occurred during the SARS-CoV-2 public health emergency.  Safety protocols were in place, including screening questions prior to the visit, additional usage of staff PPE, and extensive cleaning of exam room while observing appropriate contact time as indicated for disinfecting solutions.   Follow-up: Return in about 4 weeks (around 09/06/2019).   Denice Paradise, NP

## 2019-08-10 LAB — COMPREHENSIVE METABOLIC PANEL
ALT: 16 IU/L (ref 0–32)
AST: 19 IU/L (ref 0–40)
Albumin/Globulin Ratio: 1.5 (ref 1.2–2.2)
Albumin: 4.1 g/dL (ref 3.8–4.8)
Alkaline Phosphatase: 83 IU/L (ref 39–117)
BUN/Creatinine Ratio: 10 (ref 9–23)
BUN: 8 mg/dL (ref 6–24)
Bilirubin Total: 0.3 mg/dL (ref 0.0–1.2)
CO2: 23 mmol/L (ref 20–29)
Calcium: 9.6 mg/dL (ref 8.7–10.2)
Chloride: 102 mmol/L (ref 96–106)
Creatinine, Ser: 0.79 mg/dL (ref 0.57–1.00)
GFR calc Af Amer: 108 mL/min/{1.73_m2} (ref >59)
GFR calc non Af Amer: 93 mL/min/{1.73_m2} (ref >59)
Globulin, Total: 2.7 g/dL (ref 1.5–4.5)
Glucose: 94 mg/dL (ref 65–99)
Potassium: 3.9 mmol/L (ref 3.5–5.2)
Sodium: 138 mmol/L (ref 134–144)
Total Protein: 6.8 g/dL (ref 6.0–8.5)

## 2019-08-10 LAB — TSH: TSH: 2.06 u[IU]/mL (ref 0.450–4.500)

## 2019-08-10 LAB — HEMOGLOBIN A1C
Est. average glucose Bld gHb Est-mCnc: 128 mg/dL
Hgb A1c MFr Bld: 6.1 % — ABNORMAL HIGH (ref 4.8–5.6)

## 2019-08-10 LAB — MICROALBUMIN / CREATININE URINE RATIO
Creatinine, Urine: 95.2 mg/dL
Microalb/Creat Ratio: 18 mg/g creat (ref 0–29)
Microalbumin, Urine: 16.8 ug/mL

## 2019-08-15 ENCOUNTER — Telehealth: Payer: Self-pay | Admitting: Nurse Practitioner

## 2019-08-15 NOTE — Telephone Encounter (Signed)
I called patient to see if she wanted to make a virtual visit to discuss. Pt stated that you guys had already discussed this. She said that now she is leaning more towards a yes on getting the vaccine. She wanted to know based on her medical history did you think Moderna or Pfizer was more appropriate. I advised that I would ask you, but I felt that you would say either since both have been proven safe.

## 2019-08-15 NOTE — Telephone Encounter (Signed)
Pt would like a call back to discuss covid vaccine.

## 2019-08-15 NOTE — Telephone Encounter (Signed)
LM per DPR that Kim advised that she get Pfizer given her medical history.

## 2019-08-15 NOTE — Telephone Encounter (Signed)
I would advise Pfizer for her.

## 2019-08-19 ENCOUNTER — Ambulatory Visit: Payer: Managed Care, Other (non HMO) | Attending: Nurse Practitioner

## 2019-08-22 ENCOUNTER — Ambulatory Visit: Payer: Managed Care, Other (non HMO) | Attending: Internal Medicine

## 2019-08-22 DIAGNOSIS — Z23 Encounter for immunization: Secondary | ICD-10-CM

## 2019-08-22 NOTE — Progress Notes (Signed)
   Covid-19 Vaccination Clinic  Name:  Erica Ramos    MRN: [id number] DOB: 1975/10/15  08/22/2019  [name] was observed post Covid-19 immunization for 15 minutes without incident. She was provided with Vaccine Information Sheet and instruction to access the V-Safe system.   [name] was instructed to call 911 with any severe reactions post vaccine: Marland Kitchen Difficulty breathing  . Swelling of face and throat  . A fast heartbeat  . A bad rash all over body  . Dizziness and weakness   Immunizations Administered    Name Date Dose VIS Date Route   Pfizer COVID-19 Vaccine 08/22/2019 10:07 AM 0.3 mL 06/26/2018 Intramuscular   Manufacturer: [employer], [employer]   Lot: [id number]   NDC: [address]-9

## 2019-08-30 ENCOUNTER — Telehealth: Payer: Self-pay | Admitting: Nurse Practitioner

## 2019-08-30 ENCOUNTER — Other Ambulatory Visit: Payer: Self-pay | Admitting: Nurse Practitioner

## 2019-08-30 DIAGNOSIS — I10 Essential (primary) hypertension: Secondary | ICD-10-CM

## 2019-08-30 MED ORDER — METOPROLOL SUCCINATE ER 100 MG PO TB24: 100.0000 mg | ORAL_TABLET | Freq: Every day | ORAL | 1 refills | Status: DC

## 2019-08-30 MED ORDER — HYDROCHLOROTHIAZIDE 25 MG PO TABS: 25.0000 mg | ORAL_TABLET | Freq: Every day | ORAL | 0 refills | Status: DC

## 2019-08-30 MED ORDER — NIFEDIPINE ER OSMOTIC RELEASE 60 MG PO TB24: 60.0000 mg | ORAL_TABLET | Freq: Every day | ORAL | 0 refills | Status: DC

## 2019-08-30 NOTE — Telephone Encounter (Signed)
Pt needs refills on the following sent to [facility] hydrochlorothiazide (HYDRODIURIL) 25 MG tablet metoprolol succinate (TOPROL-XL) 100 MG 24 hr tablet NIFEdipine (PROCARDIA XL/NIFEDICAL XL) 60 MG 24 hr tablet

## 2019-08-30 NOTE — Addendum Note (Signed)
Addended by: Elise Benne T on: 08/30/2019 10:54 AM   Modules accepted: Orders

## 2019-09-06 ENCOUNTER — Other Ambulatory Visit: Payer: Self-pay

## 2019-09-09 ENCOUNTER — Ambulatory Visit: Payer: Managed Care, Other (non HMO) | Admitting: Nurse Practitioner

## 2019-09-16 ENCOUNTER — Ambulatory Visit: Payer: Managed Care, Other (non HMO)

## 2019-09-17 ENCOUNTER — Ambulatory Visit (INDEPENDENT_AMBULATORY_CARE_PROVIDER_SITE_OTHER): Payer: Managed Care, Other (non HMO) | Admitting: Nurse Practitioner

## 2019-09-17 ENCOUNTER — Encounter: Payer: Self-pay | Admitting: Nurse Practitioner

## 2019-09-17 ENCOUNTER — Telehealth: Payer: Self-pay | Admitting: Nurse Practitioner

## 2019-09-17 ENCOUNTER — Other Ambulatory Visit: Payer: Self-pay

## 2019-09-17 VITALS — BP 138/80 | HR 67 | Temp 97.6°F | Ht 62.0 in | Wt 269.0 lb

## 2019-09-17 DIAGNOSIS — I10 Essential (primary) hypertension: Secondary | ICD-10-CM

## 2019-09-17 DIAGNOSIS — Z1231 Encounter for screening mammogram for malignant neoplasm of breast: Secondary | ICD-10-CM

## 2019-09-17 DIAGNOSIS — Z1389 Encounter for screening for other disorder: Secondary | ICD-10-CM | POA: Insufficient documentation

## 2019-09-17 DIAGNOSIS — N939 Abnormal uterine and vaginal bleeding, unspecified: Secondary | ICD-10-CM

## 2019-09-17 DIAGNOSIS — F321 Major depressive disorder, single episode, moderate: Secondary | ICD-10-CM

## 2019-09-17 DIAGNOSIS — R7303 Prediabetes: Secondary | ICD-10-CM

## 2019-09-17 DIAGNOSIS — Z124 Encounter for screening for malignant neoplasm of cervix: Secondary | ICD-10-CM

## 2019-09-17 HISTORY — DX: Encounter for screening for other disorder: Z13.89

## 2019-09-17 NOTE — Progress Notes (Addendum)
Established Patient Office Visit  Subjective:  Patient ID: Erica Ramos, female    DOB: 20-Sep-1975  Age: 44 y.o. MRN: [id number]  CC:  Chief Complaint  Patient presents with  . Follow-up    hypertension    HPI Erica Ramos presents for follow up visit of new start Zoloft.    HTN: She remains on metoprolol succinate 100 mg 1 tablet daily, hydrochlorothiazide 25 mg 1 tablet daily, nifedipine 60 mg 1 tablet daily.  She has not been checking her blood pressure at home.  She has a wrist cuff.  She has noted no chest pain, palpitations, shortness of breath, DOE, or edema.  She has no plans for future pregnancy.   Depression/anxiety: Zoloft 50 mg daily and she has noted no symptoms as she increased her dose from 25 mg daily. Her mood is better and she feels well.    Obesity: BMI 49: Wt is good-she is down 8 lbs.  She is cooking more at home and working out 30 min every day- walking- plus  elliptical and working on weights-arms/legs, jump rope and she is doing OK with <5 min.    Wt Readings from Last 3 Encounters:  09/17/19 269 lb (122 kg)  08/09/19 277 lb 3.2 oz (125.7 kg)  03/15/19 273 lb (123.8 kg)   Pre diabetes: Declines Metformin for now and she will not take it off the table.  She is doing well with lifestyle changes.  Lab Results  Component Value Date   HGBA1C 6.1 (H) 08/09/2019   Eye exam: It was done and she has to go back next Wed to do another exam because she had allergies and was given eye drops by a pervious optometrist. One pupil is larger than the other and is sluggish with the light. She will then have her eyes  Dilated next week. Followed now at [facility] doctor.   Uterine Fibroids: No HA. She gets HA- mild with her cycle. She either gets cramps  or HA. She used to have migraine HA during her menses. Over the years, it has improved. Uterine fibroids and heavy menses. She wants to see GYN again. Due for PAP test.    Past Medical History:  Diagnosis Date  .  Anxiety   . Bronchitis    chronic - has flare every December  . Cervical polyp    hx of  . Environmental allergies   . Genital warts   . GERD (gastroesophageal reflux disease)    Per MD chart note 12/01/2010  . Heart murmur   . Hx of abnormal Pap smear 2012  . Hypertension   . Morbid obesity (HCC)    BMI 47.9  . Persistent headaches    worst during menstrual cycle  . PVC (premature ventricular contraction) 02/20/2015  . STD (sexually transmitted disease)    hx genital warts/?HSV  . Vaginal Pap smear, abnormal     Past Surgical History:  Procedure Laterality Date  . CESAREAN SECTION N/A 06/05/2016   Procedure: CESAREAN SECTION;  Surgeon: Erica Browns, MD;  Location: [facility];  Service: Obstetrics;  Laterality: N/A;  . CESAREAN SECTION N/A 09/20/2017   Procedure: CESAREAN SECTION;  Surgeon: Erica Leitz, MD;  Location: [facility];  Service: Obstetrics;  Laterality: N/A;  EDD 10/02/17  . COLPOSCOPY  2012   no treatment to cervix--pap smears reverted to normal  . WISDOM TOOTH EXTRACTION      Family History  Problem Relation Age of Onset  .  Diabetes Mother   . Hypertension Mother        Under control  . Diabetes Maternal Aunt   . Seizures Maternal Aunt   . Diabetes Maternal Grandmother   . Hypertension Maternal Grandmother   . Alcohol abuse Father   . Diabetes Father   . Hypertension Sister   . Diabetes Maternal Uncle   . Hypertension Other   . Obesity Other   . Heart attack Paternal Grandmother   . COPD Neg Hx     Social History   Socioeconomic History  . Marital status: Single    Spouse name: Not on file  . Number of children: 0  . Years of education: Not on file  . Highest education level: Not on file  Occupational History    Employer: [employer]  Tobacco Use  . Smoking status: Never Smoker  . Smokeless tobacco: Never Used  Substance and Sexual Activity  . Alcohol use: Yes    Alcohol/week: 1.0 standard drinks    Types: 1 Standard  drinks or equivalent per week    Comment: Occasionally; less than 1-2 a week  . Drug use: No  . Sexual activity: Yes    Partners: Male    Birth control/protection: Condom    Comment: condoms sometimes  Other Topics Concern  . Not on file  Social History Narrative   Exercise--  2-3 x a week for about 30 min- 60 min   Social Determinants of Health   Financial Resource Strain:   . Difficulty of Paying Living Expenses:   Food Insecurity:   . Worried About Charity fundraiser in the Last Year:   . Arboriculturist in the Last Year:   Transportation Needs:   . Film/video editor (Medical):   Marland Kitchen Lack of Transportation (Non-Medical):   Physical Activity:   . Days of Exercise per Week:   . Minutes of Exercise per Session:   Stress:   . Feeling of Stress :   Social Connections:   . Frequency of Communication with Friends and Family:   . Frequency of Social Gatherings with Friends and Family:   . Attends Religious Services:   . Active Member of Clubs or Organizations:   . Attends Archivist Meetings:   Marland Kitchen Marital Status:   Intimate Partner Violence:   . Fear of Current or Ex-Partner:   . Emotionally Abused:   Marland Kitchen Physically Abused:   . Sexually Abused:     Outpatient Medications Prior to Visit  Medication Sig Dispense Refill  . acetaminophen (TYLENOL) 500 MG tablet Take 500-1,000 mg by mouth daily as needed for headache.     . calcium carbonate (TUMS - DOSED IN MG ELEMENTAL CALCIUM) 500 MG chewable tablet Chew 3-4 tablets by mouth daily as needed for indigestion or heartburn.    . cetirizine (ZYRTEC) 10 MG tablet Take 1 tablet (10 mg total) by mouth daily. 90 tablet 3  . Cholecalciferol (VITAMIN D3) 5000 units CAPS Take 5,000 Units by mouth daily.    Marland Kitchen GARLIC PO Take 1 capsule by mouth daily.    . hydrochlorothiazide (HYDRODIURIL) 25 MG tablet Take 1 tablet (25 mg total) by mouth daily. 90 tablet 0  . metoprolol succinate (TOPROL-XL) 100 MG 24 hr tablet Take 1 tablet  (100 mg total) by mouth daily. Take with or immediately following a meal. 90 tablet 1  . NIFEdipine (PROCARDIA XL/NIFEDICAL XL) 60 MG 24 hr tablet Take 1 tablet (60 mg total) by mouth  daily. 30 tablet 0  . omeprazole (PRILOSEC) 20 MG capsule Take 1 capsule (20 mg total) by mouth daily. 30 capsule 5  . Prenatal Vit-Fe Fumarate-FA (MULTIVITAMIN-PRENATAL) 27-0.8 MG TABS tablet Take 1 tablet by mouth daily.     . sertraline (ZOLOFT) 50 MG tablet Take 1 tablet (50 mg total) by mouth daily. Take 1/2 tab daily for 1 week and then go to 1 tab daily if tolerated. 30 tablet 0  . triamcinolone (NASACORT AQ) 55 MCG/ACT AERO nasal inhaler Place 2 sprays into the nose daily. (Patient taking differently: Place 1 spray into the nose daily. ) 1 Inhaler 12  . triamcinolone cream (KENALOG) 0.1 % Apply 1 application topically 2 (two) times daily as needed. 30 g 0   No facility-administered medications prior to visit.    Allergies  Allergen Reactions  . Neosporin [Neomycin-Bacitracin Zn-Polymyx] Anaphylaxis  . Penicillins Nausea And Vomiting, Rash and Other (See Comments)    Has patient had a PCN reaction causing immediate rash, facial/tongue/throat swelling, SOB or lightheadedness with hypotension: Yes Has patient had a PCN reaction causing severe rash involving mucus membranes or skin necrosis: Yes Has patient had a PCN reaction that required hospitalization Yes Has patient had a PCN reaction occurring within the last 10 years: No If all of the above answers are "NO", then may proceed with Cephalosporin use.   Rolland Porter Flavor      Review of Systems  Constitutional: Negative.   HENT: Negative.   Eyes:       See HPI  Respiratory: Negative for cough and shortness of breath.   Cardiovascular: Negative for chest pain and leg swelling.  Gastrointestinal: Negative.   Genitourinary: Negative.   Musculoskeletal: Negative.   Psychiatric/Behavioral:       Feeling better- see HPI      Objective:      Physical Exam  Constitutional: She is oriented to person, place, and time. She appears well-developed and well-nourished.  HENT:  Head: Normocephalic.  Eyes:  Exam today: right eye pupil >size.  Left upper eyelid mild  pytosis- chronic.  Cardiovascular: Normal rate, regular rhythm and normal heart sounds.  Pulmonary/Chest: Effort normal and breath sounds normal.  Abdominal: Soft. There is no abdominal tenderness.  Musculoskeletal:        General: No edema. Normal range of motion.     Cervical back: Normal range of motion and neck supple.  Neurological: She is alert and oriented to person, place, and time.  Skin: Skin is warm and dry.  Psychiatric: She has a normal mood and affect. Her behavior is normal. Judgment and thought content normal.  Feeling better on Zoloft- new start. PHQ9: 3  and GAD-7: 2 . Denies SI/HI.  Vitals reviewed.   BP 138/80 (BP Location: Left Arm, Patient Position: Sitting, Cuff Size: Large)   Pulse 67   Temp 97.6 F (36.4 C) (Skin)   Ht 5\' 2"  (1.575 m)   Wt 269 lb (122 kg)   SpO2 99%   BMI 49.20 kg/m  Wt Readings from Last 3 Encounters:  09/17/19 269 lb (122 kg)  08/09/19 277 lb 3.2 oz (125.7 kg)  03/15/19 273 lb (123.8 kg)     Health Maintenance Due  Topic Date Due  . COVID-19 Vaccine (2 - Pfizer 2-dose series) 09/12/2019    There are no preventive care reminders to display for this patient.  Lab Results  Component Value Date   TSH 2.060 08/09/2019   Lab Results  Component Value Date  WBC 8.6 11/08/2018   HGB 12.8 11/08/2018   HCT 39.5 11/08/2018   MCV 80.7 11/08/2018   PLT 300.0 11/08/2018   Lab Results  Component Value Date   NA 138 08/09/2019   K 3.9 08/09/2019   CO2 23 08/09/2019   GLUCOSE 94 08/09/2019   BUN 8 08/09/2019   CREATININE 0.79 08/09/2019   BILITOT 0.3 08/09/2019   ALKPHOS 83 08/09/2019   AST 19 08/09/2019   ALT 16 08/09/2019   PROT 6.8 08/09/2019   ALBUMIN 4.1 08/09/2019   CALCIUM 9.6 08/09/2019   ANIONGAP  11 09/20/2017   GFR 133.32 03/15/2019   Lab Results  Component Value Date   CHOL 171 03/15/2019   Lab Results  Component Value Date   HDL 56.70 03/15/2019   Lab Results  Component Value Date   LDLCALC 97 03/15/2019   Lab Results  Component Value Date   TRIG 87.0 03/15/2019   Lab Results  Component Value Date   CHOLHDL 3 03/15/2019   Lab Results  Component Value Date   HGBA1C 6.1 (H) 08/09/2019      Assessment & Plan:   Problem List Items Addressed This Visit      Cardiovascular and Mediastinum   Essential hypertension - Primary     Other   Depression, major, single episode, moderate (HCC)   Vaginal bleeding   Screening mammogram, encounter for   Pre-diabetes    Other Visit Diagnoses    Pap smear for cervical cancer screening       Relevant Orders   Ambulatory referral to Obstetrics / Gynecology   MM 3D SCREEN BREAST BILATERAL      No orders of the defined types were placed in this encounter.  Please check BP at home with extra large cuff. Continue with current medications for now. BP not at goal, yet. Goal  <120/80. Working on weight loss and now exercising regularly.   See [facility] for abnormal pupil changes and please ask them to send me a copy of their notes and if any further recommendations needed.   Referral to GYN for routine PAP and hx of fibroids and heavy bleeding.   I ordered a screening mammogram at [facility] for June when you are due. However, your next Covid vaccine is on 09/19/2019 and mammograms need to be  3 mos apart from the last vaccine. Please schedule with [facility] when eligible.    Low-glycemic Index diet info. Great job on your weight loss and exercise! You are really doing well!  Continue on the Zoloft 50 mg dose. .   I provided  30 minutes of  time during this encounter reviewing patient's current problems and past surgeries, labs and imaging studies, providing counseling on the above mentioned problems , and  coordination  of care .  Follow-up: Return in about 3 months (around 12/18/2019).   This visit occurred during the SARS-CoV-2 public health emergency.  Safety protocols were in place, including screening questions prior to the visit, additional usage of staff PPE, and extensive cleaning of exam room while observing appropriate contact time as indicated for disinfecting solutions.    Amedeo Kinsman, NP

## 2019-09-17 NOTE — Patient Instructions (Addendum)
Please check BP at home with extra large cuff. Continue with current medications for now.   See [facility] for abnormal pupil changes and please ask them to send me a copy of their notes and if any further recommendations needed.   Referral to GYN for routine PAP and hx of fibroids and heavy bleeding.   I ordered a screening mammogram at [facility] for June when you are due. However, your next Covid vaccine is on 09/19/2019 and mammograms need to be  3 mos apart from the last vaccine. Please schedule with [facility] when eligible.    Please call the [facility] to make your own appt and they do require the mammogram to be 3 mos out from last Covid vaccine.   [facility]  [address]  [place], [place]  [phone]  * Offers 3D mammogram if you ask  Low-glycemic Index diet info. Great job on your weight loss and exercise! You are really doing well!  Continue on the Zoloft 50 mg dose.

## 2019-09-17 NOTE — Telephone Encounter (Addendum)
I ordered a screening mammogram at [facility] for June when you are due. However, your next Covid vaccine is on 09/19/2019 and mammograms need to be 6 weeks apart from the last vaccine. Please make an appt for her at at the [facility] to make your own appt and they do require the mammogram to be 3 mos out from last Covid vaccine.   [facility]  [address]  [place], [place]  [phone]  * Offers 3D mammogram if you ask

## 2019-09-19 ENCOUNTER — Ambulatory Visit: Payer: Managed Care, Other (non HMO) | Attending: Internal Medicine

## 2019-09-19 DIAGNOSIS — Z23 Encounter for immunization: Secondary | ICD-10-CM

## 2019-09-19 MED ORDER — AMLODIPINE BESYLATE 5 MG PO TABS: 5.0000 mg | ORAL_TABLET | Freq: Every day | ORAL | 3 refills | Status: DC

## 2019-09-19 NOTE — Addendum Note (Signed)
Addended by: Amedeo Kinsman A on: 09/19/2019 08:45 AM   Modules accepted: Orders

## 2019-09-19 NOTE — Telephone Encounter (Signed)
She would like to come off of Nifedipine for HTN.   Please call her and advise: PLAN:   1. OK to stop the Nifedipine now - or complete her pill bottle.Then stop it.   2. The next day begin amlodipine 5 mg daily #30 MRF X 2 and stay on Metoprolol and HCTZ 25 mg.   3. Please see me in the office for BP check in 1 month on this new medicine. Bring in BP readings from home. Looking into buying an arm cuff- extra large OMRON brand is recommended.

## 2019-09-19 NOTE — Progress Notes (Signed)
   Covid-19 Vaccination Clinic  Name:  Erica Ramos    MRN: [id number] DOB: [birth year]  09/19/2019  Ms. [name] was observed post Covid-19 immunization for 30 minutes based on pre-vaccination screening without incident. She was provided with Vaccine Information Sheet and instruction to access the V-Safe system.   Ms. [name] was instructed to call 911 with any severe reactions post vaccine: Marland Kitchen Difficulty breathing  . Swelling of face and throat  . A fast heartbeat  . A bad rash all over body  . Dizziness and weakness   Immunizations Administered    Name Date Dose VIS Date Route   Pfizer COVID-19 Vaccine 09/19/2019 10:05 AM 0.3 mL 06/26/2018 Intramuscular   Manufacturer: [employer], [employer]   Lot: [id number]   NDC: [address]-9

## 2019-09-20 NOTE — Telephone Encounter (Signed)
Patient aware of medication change and 1 month f/u scheduled for BP check

## 2019-09-20 NOTE — Telephone Encounter (Signed)
lmtcb

## 2019-10-18 ENCOUNTER — Ambulatory Visit: Payer: Managed Care, Other (non HMO) | Admitting: Nurse Practitioner

## 2019-10-22 ENCOUNTER — Other Ambulatory Visit: Payer: Self-pay

## 2019-10-22 ENCOUNTER — Ambulatory Visit: Payer: Managed Care, Other (non HMO) | Admitting: Nurse Practitioner

## 2019-10-22 ENCOUNTER — Encounter: Payer: Self-pay | Admitting: Nurse Practitioner

## 2019-10-22 VITALS — BP 140/84 | HR 72 | Temp 98.0°F | Ht 62.0 in | Wt 274.1 lb

## 2019-10-22 DIAGNOSIS — N611 Abscess of the breast and nipple: Secondary | ICD-10-CM | POA: Diagnosis not present

## 2019-10-22 DIAGNOSIS — I10 Essential (primary) hypertension: Secondary | ICD-10-CM | POA: Diagnosis not present

## 2019-10-22 DIAGNOSIS — L0292 Furuncle, unspecified: Secondary | ICD-10-CM | POA: Insufficient documentation

## 2019-10-22 HISTORY — DX: Abscess of the breast and nipple: N61.1

## 2019-10-22 MED ORDER — DOXYCYCLINE HYCLATE 100 MG PO TABS
100.0000 mg | ORAL_TABLET | Freq: Two times a day (BID) | ORAL | 0 refills | Status: DC
Start: 2019-10-22 — End: 2019-11-20

## 2019-10-22 MED ORDER — LOSARTAN POTASSIUM 50 MG PO TABS: 50.0000 mg | ORAL_TABLET | Freq: Every day | ORAL | 3 refills | Status: DC

## 2019-10-22 NOTE — Assessment & Plan Note (Signed)
For the breast boil: Continue to apply warm compresses as often as you can during the day.  I have ordered doxycycline for  you.  Monitor for decrease in size, and it will likely start to drain.  You can apply a light dressing to it and keep  dry and clean. Call if it worsens, or you develop redness, warmth, fever. Follow up in 2 weeks to recheck.

## 2019-10-22 NOTE — Progress Notes (Signed)
Established Patient Office Visit  Subjective:  Patient ID: Erica Ramos, female    DOB: 1975-07-22  Age: 44 y.o. MRN: [id number]  CC:  Chief Complaint  Patient presents with  . Follow-up    hypertension    HPI Erica Ramos presents for hypertension and presents for a new problem of boil at left breast.   Essential hypertension: Presents on metoprolol succinate 100 mg daily, Hydrochlorothiazide 25 mg daily, amlodipine 5 mg daily.  Blood pressure 140/84. Patient has been taking medication every day.  She has ordered a good arm blood pressure cuff with extra-large, and that should be coming in from [employer].  She plans to check her blood pressure at home.  She has been exercising multiple times a week at the gym.  She is little frustrated that she has not been losing weight.  Breast boil: Left knot in her chest area and it cam e and went away. Now, it is back and larger.  She has been applying hot washcloths to it and it seems to shrink it.  She has been sweating, and using her arms- friction and thinks that is what aggravated the site.  She has noted no erythema, fevers or chills, or unusual malaise.  She reports anaphylaxis with bacitracin Neosporin appointment.  BP Readings from Last 3 Encounters:  10/22/19 140/84  09/17/19 138/80  08/09/19 (!) 136/92    Wt Readings from Last 3 Encounters:  10/22/19 274 lb 1.6 oz (124.3 kg)  09/17/19 269 lb (122 kg)  08/09/19 277 lb 3.2 oz (125.7 kg)    Past Medical History:  Diagnosis Date  . Anxiety   . Bronchitis    chronic - has flare every December  . Cervical polyp    hx of  . Environmental allergies   . Genital warts   . GERD (gastroesophageal reflux disease)    Per MD chart note 12/01/2010  . Heart murmur   . Hx of abnormal Pap smear 2012  . Hypertension   . Morbid obesity (HCC)    BMI 47.9  . Persistent headaches    worst during menstrual cycle  . PVC (premature ventricular contraction) 02/20/2015  . STD (sexually  transmitted disease)    hx genital warts/?HSV  . Vaginal Pap smear, abnormal     Past Surgical History:  Procedure Laterality Date  . CESAREAN SECTION N/A 06/05/2016   Procedure: CESAREAN SECTION;  Surgeon: Hoover Browns, MD;  Location: [facility]THING [facility];  Service: Obstetrics;  Laterality: N/A;  . CESAREAN SECTION N/A 09/20/2017   Procedure: CESAREAN SECTION;  Surgeon: Gerald Leitz, MD;  Location: [facility];  Service: Obstetrics;  Laterality: N/A;  EDD 10/02/17  . COLPOSCOPY  2012   no treatment to cervix--pap smears reverted to normal  . WISDOM TOOTH EXTRACTION      Family History  Problem Relation Age of Onset  . Diabetes Mother   . Hypertension Mother        Under control  . Diabetes Maternal Aunt   . Seizures Maternal Aunt   . Diabetes Maternal Grandmother   . Hypertension Maternal Grandmother   . Alcohol abuse Father   . Diabetes Father   . Hypertension Sister   . Diabetes Maternal Uncle   . Hypertension Other   . Obesity Other   . Heart attack Paternal Grandmother   . COPD Neg Hx     Social History   Socioeconomic History  . Marital status: Single    Spouse name: Not on  file  . Number of children: 0  . Years of education: Not on file  . Highest education level: Not on file  Occupational History    Employer: [employer]  Tobacco Use  . Smoking status: Never Smoker  . Smokeless tobacco: Never Used  Substance and Sexual Activity  . Alcohol use: Yes    Alcohol/week: 1.0 standard drink    Types: 1 Standard drinks or equivalent per week    Comment: Occasionally; less than 1-2 a week  . Drug use: No  . Sexual activity: Yes    Partners: Male    Birth control/protection: Condom    Comment: condoms sometimes  Other Topics Concern  . Not on file  Social History Narrative   Exercise--  2-3 x a week for about 30 min- 60 min   Social Determinants of Health   Financial Resource Strain:   . Difficulty of Paying Living Expenses:   Food Insecurity:   .  Worried About Programme researcher, broadcasting/film/video in the Last Year:   . Barista in the Last Year:   Transportation Needs:   . Freight forwarder (Medical):   Marland Kitchen Lack of Transportation (Non-Medical):   Physical Activity:   . Days of Exercise per Week:   . Minutes of Exercise per Session:   Stress:   . Feeling of Stress :   Social Connections:   . Frequency of Communication with Friends and Family:   . Frequency of Social Gatherings with Friends and Family:   . Attends Religious Services:   . Active Member of Clubs or Organizations:   . Attends Banker Meetings:   Marland Kitchen Marital Status:   Intimate Partner Violence:   . Fear of Current or Ex-Partner:   . Emotionally Abused:   Marland Kitchen Physically Abused:   . Sexually Abused:     Outpatient Medications Prior to Visit  Medication Sig Dispense Refill  . acetaminophen (TYLENOL) 500 MG tablet Take 500-1,000 mg by mouth daily as needed for headache.     . calcium carbonate (TUMS - DOSED IN MG ELEMENTAL CALCIUM) 500 MG chewable tablet Chew 3-4 tablets by mouth daily as needed for indigestion or heartburn.    . cetirizine (ZYRTEC) 10 MG tablet Take 1 tablet (10 mg total) by mouth daily. 90 tablet 3  . Cholecalciferol (VITAMIN D3) 5000 units CAPS Take 5,000 Units by mouth daily.    Marland Kitchen GARLIC PO Take 1 capsule by mouth daily.    . hydrochlorothiazide (HYDRODIURIL) 25 MG tablet Take 1 tablet (25 mg total) by mouth daily. 90 tablet 0  . metoprolol succinate (TOPROL-XL) 100 MG 24 hr tablet Take 1 tablet (100 mg total) by mouth daily. Take with or immediately following a meal. 90 tablet 1  . omeprazole (PRILOSEC) 20 MG capsule Take 1 capsule (20 mg total) by mouth daily. 30 capsule 5  . Prenatal Vit-Fe Fumarate-FA (MULTIVITAMIN-PRENATAL) 27-0.8 MG TABS tablet Take 1 tablet by mouth daily.     . sertraline (ZOLOFT) 50 MG tablet Take 1 tablet (50 mg total) by mouth daily. Take 1/2 tab daily for 1 week and then go to 1 tab daily if tolerated. 30 tablet 0    . triamcinolone (NASACORT AQ) 55 MCG/ACT AERO nasal inhaler Place 2 sprays into the nose daily. (Patient taking differently: Place 1 spray into the nose daily. ) 1 Inhaler 12  . triamcinolone cream (KENALOG) 0.1 % Apply 1 application topically 2 (two) times daily as needed. 30 g 0  .  amLODipine (NORVASC) 5 MG tablet Take 1 tablet (5 mg total) by mouth daily. 90 tablet 3   No facility-administered medications prior to visit.    Allergies  Allergen Reactions  . Neosporin [Neomycin-Bacitracin Zn-Polymyx] Anaphylaxis  . Penicillins Nausea And Vomiting, Rash and Other (See Comments)    Has patient had a PCN reaction causing immediate rash, facial/tongue/throat swelling, SOB or lightheadedness with hypotension: Yes Has patient had a PCN reaction causing severe rash involving mucus membranes or skin necrosis: Yes Has patient had a PCN reaction that required hospitalization Yes Has patient had a PCN reaction occurring within the last 10 years: No If all of the above answers are "NO", then may proceed with Cephalosporin use.   [place] Flavor     Review of Systems Pertinent positives noted in history of present illness , positive belching and gassy today. Otherwise negative.   Objective:    Physical Exam Vitals reviewed.  Constitutional:      Appearance: Normal appearance. She is obese.  Cardiovascular:     Rate and Rhythm: Normal rate and regular rhythm.     Pulses: Normal pulses.     Heart sounds: Normal heart sounds.  Pulmonary:     Effort: Pulmonary effort is normal.     Breath sounds: Normal breath sounds.  Abdominal:     Palpations: Abdomen is soft.     Tenderness: There is no abdominal tenderness.  Skin:    General: Skin is warm and dry.     Comments: Left breast small boil with Roszak pustule- non draining.   Neurological:     Mental Status: She is alert.       BP 140/84 (BP Location: Left Arm, Patient Position: Sitting, Cuff Size: Large)   Pulse 72   Temp 98 F  (36.7 C) (Skin)   Ht 5\' 2"  (1.575 m)   Wt 274 lb 1.6 oz (124.3 kg)   SpO2 98%   BMI 50.13 kg/m  Wt Readings from Last 3 Encounters:  10/22/19 274 lb 1.6 oz (124.3 kg)  09/17/19 269 lb (122 kg)  08/09/19 277 lb 3.2 oz (125.7 kg)    Lab Results  Component Value Date   TSH 2.060 08/09/2019   Lab Results  Component Value Date   WBC 8.6 11/08/2018   HGB 12.8 11/08/2018   HCT 39.5 11/08/2018   MCV 80.7 11/08/2018   PLT 300.0 11/08/2018   Lab Results  Component Value Date   NA 138 08/09/2019   K 3.9 08/09/2019   CO2 23 08/09/2019   GLUCOSE 94 08/09/2019   BUN 8 08/09/2019   CREATININE 0.79 08/09/2019   BILITOT 0.3 08/09/2019   ALKPHOS 83 08/09/2019   AST 19 08/09/2019   ALT 16 08/09/2019   PROT 6.8 08/09/2019   ALBUMIN 4.1 08/09/2019   CALCIUM 9.6 08/09/2019   ANIONGAP 11 09/20/2017   GFR 133.32 03/15/2019   Lab Results  Component Value Date   CHOL 171 03/15/2019   Lab Results  Component Value Date   HDL 56.70 03/15/2019   Lab Results  Component Value Date   LDLCALC 97 03/15/2019   Lab Results  Component Value Date   TRIG 87.0 03/15/2019   Lab Results  Component Value Date   CHOLHDL 3 03/15/2019   Lab Results  Component Value Date   HGBA1C 6.1 (H) 08/09/2019      Assessment & Plan:   Problem List Items Addressed This Visit      Cardiovascular and Mediastinum  Essential hypertension - Primary   Relevant Medications   losartan (COZAAR) 50 MG tablet     Musculoskeletal and Integument   Boil, breast    For the breast boil: Continue to apply warm compresses as often as you can during the day.  I have ordered doxycycline for  you.  Monitor for decrease in size, and it will likely start to drain.  You can apply a light dressing to it and keep  dry and clean. Call if it worsens, or you develop redness, warmth, fever. Follow up in 2 weeks to recheck.         Other   Morbid obesity (HCC)    Given dietary information sheets, we discussed low  carbohydrate diet plan which may be more effective for her.  She reports she is already cut out a lot of junk food and sugared items.  She is starting trouble losing weight.  She has started working out at [employer]          Meds ordered this encounter  Medications  . doxycycline (VIBRA-TABS) 100 MG tablet    Sig: Take 1 tablet (100 mg total) by mouth 2 (two) times daily.    Dispense:  14 tablet    Refill:  0    Order Specific Question:   Supervising Provider    Answer:   Dale [place] [id number]  . losartan (COZAAR) 50 MG tablet    Sig: Take 1 tablet (50 mg total) by mouth daily.    Dispense:  90 tablet    Refill:  3    Order Specific Question:   Supervising Provider    Answer:   Dale [place] [[id number]]    I provided  30 minutes of non-face-to-face time during this encounter reviewing patient's current problems and  labs an, providing counseling on the above mentioned problems , and coordination  of care .  Follow-up: Return in about 2 weeks (around 11/05/2019).   This visit occurred during the SARS-CoV-2 public health emergency.  Safety protocols were in place, including screening questions prior to the visit, additional usage of staff PPE, and extensive cleaning of exam room while observing appropriate contact time as indicated for disinfecting solutions.   Amedeo Kinsman, NP

## 2019-10-22 NOTE — Addendum Note (Signed)
Addended by: Amedeo Kinsman A on: 10/22/2019 08:19 AM   Modules accepted: Level of Service

## 2019-10-22 NOTE — Assessment & Plan Note (Addendum)
Given dietary information sheets, we discussed low carbohydrate diet plan which may be more effective for her.  She reports she is already cut out a lot of junk food and sugared items.  She is starting trouble losing weight.  She has started working out at [employer]

## 2019-10-22 NOTE — Patient Instructions (Addendum)
For the breast boil: Continue to apply warm compresses as often as you can during the day.  I have ordered doxycycline for  you.  Monitor for decrease in size, and it will likely start to drain.  You can apply a light dressing to it and keep  dry and clean. Call if it worsens, or you develop redness, warmth, fever. Follow up in 2 weeks to recheck.   Please monitor your blood pressure at home.  You have a cuff on the way.  Stop the amlodipine. I have ordered the losartan which is similar to the lisinopril that did well for you. Less risk of cough and angioedema. We need to see you in 2 weeks to check BP and check kidney function.  If the blood pressure remains >130/80 and not at goal, we will need to change your medications.  Continue to work on healthy lifestyle.  Follow the suggestion on the diet sheets.  Congratulations for exercising more.    Hypertension, Adult High blood pressure (hypertension) is when the force of blood pumping through the arteries is too strong. The arteries are the blood vessels that carry blood from the heart throughout the body. Hypertension forces the heart to work harder to pump blood and may cause arteries to become narrow or stiff. Untreated or uncontrolled hypertension can cause a heart attack, heart failure, a stroke, kidney disease, and other problems. A blood pressure reading consists of a higher number over a lower number. Ideally, your blood pressure should be below 120/80. The first ("top") number is called the systolic pressure. It is a measure of the pressure in your arteries as your heart beats. The second ("bottom") number is called the diastolic pressure. It is a measure of the pressure in your arteries as the heart relaxes. What are the causes? The exact cause of this condition is not known. There are some conditions that result in or are related to high blood pressure. What increases the risk? Some risk factors for high blood pressure are under your  control. The following factors may make you more likely to develop this condition:  Smoking.  Having type 2 diabetes mellitus, high cholesterol, or both.  Not getting enough exercise or physical activity.  Being overweight.  Having too much fat, sugar, calories, or salt (sodium) in your diet.  Drinking too much alcohol. Some risk factors for high blood pressure may be difficult or impossible to change. Some of these factors include:  Having chronic kidney disease.  Having a family history of high blood pressure.  Age. Risk increases with age.  Race. You may be at higher risk if you are African American.  Gender. Men are at higher risk than women before age 45. After age 66, women are at higher risk than men.  Having obstructive sleep apnea.  Stress. What are the signs or symptoms? High blood pressure may not cause symptoms. Very high blood pressure (hypertensive crisis) may cause:  Headache.  Anxiety.  Shortness of breath.  Nosebleed.  Nausea and vomiting.  Vision changes.  Severe chest pain.  Seizures. How is this diagnosed? This condition is diagnosed by measuring your blood pressure while you are seated, with your arm resting on a flat surface, your legs uncrossed, and your feet flat on the floor. The cuff of the blood pressure monitor will be placed directly against the skin of your upper arm at the level of your heart. It should be measured at least twice using the same arm. Certain conditions  can cause a difference in blood pressure between your right and left arms. Certain factors can cause blood pressure readings to be lower or higher than normal for a short period of time:  When your blood pressure is higher when you are in a health care provider's office than when you are at home, this is called Lemere coat hypertension. Most people with this condition do not need medicines.  When your blood pressure is higher at home than when you are in a health care  provider's office, this is called masked hypertension. Most people with this condition may need medicines to control blood pressure. If you have a high blood pressure reading during one visit or you have normal blood pressure with other risk factors, you may be asked to:  Return on a different day to have your blood pressure checked again.  Monitor your blood pressure at home for 1 week or longer. If you are diagnosed with hypertension, you may have other blood or imaging tests to help your health care provider understand your overall risk for other conditions. How is this treated? This condition is treated by making healthy lifestyle changes, such as eating healthy foods, exercising more, and reducing your alcohol intake. Your health care provider may prescribe medicine if lifestyle changes are not enough to get your blood pressure under control, and if:  Your systolic blood pressure is above 130.  Your diastolic blood pressure is above 80. Your personal target blood pressure may vary depending on your medical conditions, your age, and other factors. Follow these instructions at home: Eating and drinking   Eat a diet that is high in fiber and potassium, and low in sodium, added sugar, and fat. An example eating plan is called the DASH (Dietary Approaches to Stop Hypertension) diet. To eat this way: ? Eat plenty of fresh fruits and vegetables. Try to fill one half of your plate at each meal with fruits and vegetables. ? Eat whole grains, such as whole-wheat pasta, brown rice, or whole-grain bread. Fill about one fourth of your plate with whole grains. ? Eat or drink low-fat dairy products, such as skim milk or low-fat yogurt. ? Avoid fatty cuts of meat, processed or cured meats, and poultry with skin. Fill about one fourth of your plate with lean proteins, such as fish, chicken without skin, beans, eggs, or tofu. ? Avoid pre-made and processed foods. These tend to be higher in sodium, added  sugar, and fat.  Reduce your daily sodium intake. Most people with hypertension should eat less than 1,500 mg of sodium a day.  Do not drink alcohol if: ? Your health care provider tells you not to drink. ? You are pregnant, may be pregnant, or are planning to become pregnant.  If you drink alcohol: ? Limit how much you use to:  0-1 drink a day for women.  0-2 drinks a day for men. ? Be aware of how much alcohol is in your drink. In the [place] one drink equals one 12 oz bottle of beer (355 mL), one 5 oz glass of wine (148 mL), or one 1 oz glass of hard liquor (44 mL). Lifestyle   Work with your health care provider to maintain a healthy body weight or to lose weight. Ask what an ideal weight is for you.  Get at least 30 minutes of exercise most days of the week. Activities may include walking, swimming, or biking.  Include exercise to strengthen your muscles (resistance exercise), such as Pilates  or lifting weights, as part of your weekly exercise routine. Try to do these types of exercises for 30 minutes at least 3 days a week.  Do not use any products that contain nicotine or tobacco, such as cigarettes, e-cigarettes, and chewing tobacco. If you need help quitting, ask your health care provider.  Monitor your blood pressure at home as told by your health care provider.  Keep all follow-up visits as told by your health care provider. This is important. Medicines  Take over-the-counter and prescription medicines only as told by your health care provider. Follow directions carefully. Blood pressure medicines must be taken as prescribed.  Do not skip doses of blood pressure medicine. Doing this puts you at risk for problems and can make the medicine less effective.  Ask your health care provider about side effects or reactions to medicines that you should watch for. Contact a health care provider if you:  Think you are having a reaction to a medicine you are taking.  Have  headaches that keep coming back (recurring).  Feel dizzy.  Have swelling in your ankles.  Have trouble with your vision. Get help right away if you:  Develop a severe headache or confusion.  Have unusual weakness or numbness.  Feel faint.  Have severe pain in your chest or abdomen.  Vomit repeatedly.  Have trouble breathing. Summary  Hypertension is when the force of blood pumping through your arteries is too strong. If this condition is not controlled, it may put you at risk for serious complications.  Your personal target blood pressure may vary depending on your medical conditions, your age, and other factors. For most people, a normal blood pressure is less than 120/80.  Hypertension is treated with lifestyle changes, medicines, or a combination of both. Lifestyle changes include losing weight, eating a healthy, low-sodium diet, exercising more, and limiting alcohol. This information is not intended to replace advice given to you by your health care provider. Make sure you discuss any questions you have with your health care provider. Document Revised: 12/27/2017 Document Reviewed: 12/27/2017 Elsevier Patient Education  2020 [employer]  Skin Abscess  A skin abscess is an infected area on or under your skin that contains a collection of pus and other material. An abscess may also be called a furuncle, carbuncle, or boil. An abscess can occur in or on almost any part of your body. Some abscesses break open (rupture) on their own. Most continue to get worse unless they are treated. The infection can spread deeper into the body and eventually into your blood, which can make you feel ill. Treatment usually involves draining the abscess. What are the causes? An abscess occurs when germs, like bacteria, pass through your skin and cause an infection. This may be caused by:  A scrape or cut on your skin.  A puncture wound through your skin, including a needle injection or  insect bite.  Blocked oil or sweat glands.  Blocked and infected hair follicles.  A cyst that forms beneath your skin (sebaceous cyst) and becomes infected. What increases the risk? This condition is more likely to develop in people who:  Have a weak body defense system (immune system).  Have diabetes.  Have dry and irritated skin.  Get frequent injections or use illegal IV drugs.  Have a foreign body in a wound, such as a splinter.  Have problems with their lymph system or veins. What are the signs or symptoms? Symptoms of this condition include:  A painful, firm bump under the skin.  A bump with pus at the top. This may break through the skin and drain. Other symptoms include:  Redness surrounding the abscess site.  Warmth.  Swelling of the lymph nodes (glands) near the abscess.  Tenderness.  A sore on the skin. How is this diagnosed? This condition may be diagnosed based on:  A physical exam.  Your medical history.  A sample of pus. This may be used to find out what is causing the infection.  Blood tests.  Imaging tests, such as an ultrasound, CT scan, or MRI. How is this treated? A small abscess that drains on its own may not need treatment. Treatment for larger abscesses may include:  Moist heat or heat pack applied to the area several times a day.  A procedure to drain the abscess (incision and drainage).  Antibiotic medicines. For a severe abscess, you may first get antibiotics through an IV and then change to antibiotics by mouth. Follow these instructions at home: Medicines   Take over-the-counter and prescription medicines only as told by your health care provider.  If you were prescribed an antibiotic medicine, take it as told by your health care provider. Do not stop taking the antibiotic even if you start to feel better. Abscess care   If you have an abscess that has not drained, apply heat to the affected area. Use the heat source that  your health care provider recommends, such as a moist heat pack or a heating pad. ? Place a towel between your skin and the heat source. ? Leave the heat on for 20-30 minutes. ? Remove the heat if your skin turns bright red. This is especially important if you are unable to feel pain, heat, or cold. You may have a greater risk of getting burned.  Follow instructions from your health care provider about how to take care of your abscess. Make sure you: ? Cover the abscess with a bandage (dressing). ? Change your dressing or gauze as told by your health care provider. ? Wash your hands with soap and water before you change the dressing or gauze. If soap and water are not available, use hand sanitizer.  Check your abscess every day for signs of a worsening infection. Check for: ? More redness, swelling, or pain. ? More fluid or blood. ? Warmth. ? More pus or a bad smell. General instructions  To avoid spreading the infection: ? Do not share personal care items, towels, or hot tubs with others. ? Avoid making skin contact with other people.  Keep all follow-up visits as told by your health care provider. This is important. Contact a health care provider if you have:  More redness, swelling, or pain around your abscess.  More fluid or blood coming from your abscess.  Warm skin around your abscess.  More pus or a bad smell coming from your abscess.  A fever.  Muscle aches.  Chills or a general ill feeling. Get help right away if you:  Have severe pain.  See red streaks on your skin spreading away from the abscess. Summary  A skin abscess is an infected area on or under your skin that contains a collection of pus and other material.  A small abscess that drains on its own may not need treatment.  Treatment for larger abscesses may include having a procedure to drain the abscess and taking an antibiotic. This information is not intended to replace advice given to you by  your  health care provider. Make sure you discuss any questions you have with your health care provider. Document Revised: 08/09/2018 Document Reviewed: 06/01/2017 Elsevier Patient Education  2020 [employer]

## 2019-10-22 NOTE — Assessment & Plan Note (Signed)
Please monitor your blood pressure at home.  You have a cuff on the way.  Stop the amlodipine. I have ordered the losartan 50 mg , stay on the metoprolol XL 6 and 800 mg daily, HCTZ 25 mg daily.  2 weeks to check BP and check kidney function.  BP Readings from Last 3 Encounters:  10/22/19 140/84  09/17/19 138/80  08/09/19 (!) 136/92

## 2019-11-06 ENCOUNTER — Ambulatory Visit: Payer: Managed Care, Other (non HMO) | Admitting: Nurse Practitioner

## 2019-11-08 ENCOUNTER — Encounter: Payer: Self-pay | Admitting: Obstetrics and Gynecology

## 2019-11-13 ENCOUNTER — Ambulatory Visit: Payer: Managed Care, Other (non HMO) | Admitting: Nurse Practitioner

## 2019-11-20 ENCOUNTER — Encounter: Payer: Self-pay | Admitting: Nurse Practitioner

## 2019-11-20 ENCOUNTER — Other Ambulatory Visit: Payer: Self-pay

## 2019-11-20 ENCOUNTER — Ambulatory Visit: Payer: Managed Care, Other (non HMO) | Admitting: Nurse Practitioner

## 2019-11-20 ENCOUNTER — Ambulatory Visit (INDEPENDENT_AMBULATORY_CARE_PROVIDER_SITE_OTHER): Payer: Managed Care, Other (non HMO) | Admitting: Nurse Practitioner

## 2019-11-20 ENCOUNTER — Telehealth: Payer: Self-pay

## 2019-11-20 VITALS — BP 138/82 | HR 61 | Temp 97.9°F | Ht 62.0 in | Wt 276.0 lb

## 2019-11-20 DIAGNOSIS — I10 Essential (primary) hypertension: Secondary | ICD-10-CM | POA: Diagnosis not present

## 2019-11-20 DIAGNOSIS — F329 Major depressive disorder, single episode, unspecified: Secondary | ICD-10-CM

## 2019-11-20 DIAGNOSIS — F32A Depression, unspecified: Secondary | ICD-10-CM

## 2019-11-20 DIAGNOSIS — E559 Vitamin D deficiency, unspecified: Secondary | ICD-10-CM | POA: Diagnosis not present

## 2019-11-20 DIAGNOSIS — R7303 Prediabetes: Secondary | ICD-10-CM

## 2019-11-20 DIAGNOSIS — F419 Anxiety disorder, unspecified: Secondary | ICD-10-CM

## 2019-11-20 MED ORDER — SERTRALINE HCL 50 MG PO TABS: 50.0000 mg | ORAL_TABLET | Freq: Every day | ORAL | 3 refills | Status: DC

## 2019-11-20 MED ORDER — HYDROCHLOROTHIAZIDE 25 MG PO TABS: 25.0000 mg | ORAL_TABLET | Freq: Every day | ORAL | 0 refills | Status: DC

## 2019-11-20 MED ORDER — LOSARTAN POTASSIUM 50 MG PO TABS: 50.0000 mg | ORAL_TABLET | Freq: Every day | ORAL | 3 refills | Status: DC

## 2019-11-20 MED ORDER — METOPROLOL SUCCINATE ER 100 MG PO TB24: 100.0000 mg | ORAL_TABLET | Freq: Every day | ORAL | 1 refills | Status: DC

## 2019-11-20 NOTE — Progress Notes (Signed)
Established Patient Office Visit  Subjective:  Patient ID: Erica Ramos, female    DOB: [birth year]  Age: 44 y.o. MRN: [id number]  CC:  Chief Complaint  Patient presents with  . Follow-up    hypertension    HPI Erica Ramos is a 44 year old patient with history of hypertension, morbid obesity, prediabetes, GERD, depression, constipation, left breast boil treated with doxy presents for 2 week check.   Breast boil: Patient reports a boil did drain a little bit, resolved and is totally gone.  She tolerated the doxycycline well without side effects.   HTN: Uncontrolled:Goal 120/80:  History of hypertension  in 2015 on lisinopril HCTZ 20/12.5 mg twice daily with blood pressure controlled 124/86 heart rate 74 weight 271 with BMI 48.  Since then, she had 2 pregnancies and her  blood pressure regimen was changed to nifedipine XL 60 mg, HCTZ 25 mg daily and Toprol-XL 100 mg daily.  Her blood pressure at that time was 136/92 range.   Over the last 3 months, she has remained on Toprol-XL 100 mg daily, HCTZ 25 mg daily, and the nifedipine was stopped in favor of amlodipine 5 mg daily.  Patient's blood pressure continued to be in the 140's over mid 80's in the office.  Since she seemed to do better on an ACE inhibitor in the past and did not seem to have an effect with amlodipine, the amlodipine was discontinued and Cozaar 50 mg daily was started.  The patient feels well on this regimen.  She has been using a blood pressure cuff that is regular size at home and getting high numbers.  Patient needs to be using a large to an extra-large cuff size.  She is going to purchase one.  BP Readings from Last 3 Encounters:  11/20/19 138/82  10/22/19 140/84  09/17/19 138/80    BMI 50.48/morbid obesity/pre-diabetes:  All uncontrolled. Goal: 10 % weight loss, A1c <5.7. She has fallen off of her diet and exercise plan recently.  She plans to restart again.  Wt Readings from Last 3 Encounters:  11/20/19 276  lb (125.2 kg)  10/22/19 274 lb 1.6 oz (124.3 kg)  09/17/19 269 lb (122 kg)    Lab Results  Component Value Date   HGBA1C 6.1 (H) 08/09/2019   Preventative health: Immunizations: [employer] vaccine for 22/21 and 520/21 Mammogram : Can be done in August GYN: She has appointment set up with Dr. Marcelline Mates for her uterine fibroid and Pap test in September.  Last menstrual period was 11/05/2019  Vision: Up-to-date with her optometrist and has had dilated eye exam with unequal pupil size resolved with treatment for allergies and steroid eyedrops that have been tapered off.  She remains on Pataday eyedrops and allergy medication.  Follow-up with her eye doctor in 6 months.    Past Medical History:  Diagnosis Date  . Anxiety   . Bronchitis    chronic - has flare every December  . Cervical polyp    hx of  . Environmental allergies   . Genital warts   . GERD (gastroesophageal reflux disease)    Per MD chart note 12/01/2010  . Heart murmur   . Hx of abnormal Pap smear 2012  . Hypertension   . Morbid obesity (HCC)    BMI 47.9  . Persistent headaches    worst during menstrual cycle  . PVC (premature ventricular contraction) 02/20/2015  . STD (sexually transmitted disease)    hx genital warts/?HSV  . Vaginal  Pap smear, abnormal     Past Surgical History:  Procedure Laterality Date  . CESAREAN SECTION N/A 06/05/2016   Procedure: CESAREAN SECTION;  Surgeon: Waymon Amato, MD;  Location: [facility];  Service: Obstetrics;  Laterality: N/A;  . CESAREAN SECTION N/A 09/20/2017   Procedure: CESAREAN SECTION;  Surgeon: Christophe Louis, MD;  Location: [facility];  Service: Obstetrics;  Laterality: N/A;  EDD 10/02/17  . COLPOSCOPY  2012   no treatment to cervix--pap smears reverted to normal  . WISDOM TOOTH EXTRACTION      Family History  Problem Relation Age of Onset  . Diabetes Mother   . Hypertension Mother        Under control  . Diabetes Maternal Aunt   . Seizures Maternal Aunt   .  Diabetes Maternal Grandmother   . Hypertension Maternal Grandmother   . Alcohol abuse Father   . Diabetes Father   . Hypertension Sister   . Diabetes Maternal Uncle   . Hypertension Other   . Obesity Other   . Heart attack Paternal Grandmother   . COPD Neg Hx     Social History   Socioeconomic History  . Marital status: Single    Spouse name: Not on file  . Number of children: 0  . Years of education: Not on file  . Highest education level: Not on file  Occupational History    Employer: [employer]  Tobacco Use  . Smoking status: Never Smoker  . Smokeless tobacco: Never Used  Substance and Sexual Activity  . Alcohol use: Yes    Alcohol/week: 1.0 standard drink    Types: 1 Standard drinks or equivalent per week    Comment: Occasionally; less than 1-2 a week  . Drug use: No  . Sexual activity: Yes    Partners: Male    Birth control/protection: Condom    Comment: condoms sometimes  Other Topics Concern  . Not on file  Social History Narrative   Exercise--  2-3 x a week for about 30 min- 60 min   Social Determinants of Health   Financial Resource Strain:   . Difficulty of Paying Living Expenses:   Food Insecurity:   . Worried About Charity fundraiser in the Last Year:   . Arboriculturist in the Last Year:   Transportation Needs:   . Film/video editor (Medical):   Marland Kitchen Lack of Transportation (Non-Medical):   Physical Activity:   . Days of Exercise per Week:   . Minutes of Exercise per Session:   Stress:   . Feeling of Stress :   Social Connections:   . Frequency of Communication with Friends and Family:   . Frequency of Social Gatherings with Friends and Family:   . Attends Religious Services:   . Active Member of Clubs or Organizations:   . Attends Archivist Meetings:   Marland Kitchen Marital Status:   Intimate Partner Violence:   . Fear of Current or Ex-Partner:   . Emotionally Abused:   Marland Kitchen Physically Abused:   . Sexually Abused:      Outpatient Medications Prior to Visit  Medication Sig Dispense Refill  . acetaminophen (TYLENOL) 500 MG tablet Take 500-1,000 mg by mouth daily as needed for headache.     . calcium carbonate (TUMS - DOSED IN MG ELEMENTAL CALCIUM) 500 MG chewable tablet Chew 3-4 tablets by mouth daily as needed for indigestion or heartburn.    . cetirizine (ZYRTEC) 10 MG tablet Take  1 tablet (10 mg total) by mouth daily. 90 tablet 3  . Cholecalciferol (VITAMIN D3) 5000 units CAPS Take 5,000 Units by mouth daily.    Marland Kitchen GARLIC PO Take 1 capsule by mouth daily.    Marland Kitchen omeprazole (PRILOSEC) 20 MG capsule Take 1 capsule (20 mg total) by mouth daily. 30 capsule 5  . Prenatal Vit-Fe Fumarate-FA (MULTIVITAMIN-PRENATAL) 27-0.8 MG TABS tablet Take 1 tablet by mouth daily.     Marland Kitchen triamcinolone (NASACORT AQ) 55 MCG/ACT AERO nasal inhaler Place 2 sprays into the nose daily. (Patient taking differently: Place 1 spray into the nose daily. ) 1 Inhaler 12  . triamcinolone cream (KENALOG) 0.1 % Apply 1 application topically 2 (two) times daily as needed. 30 g 0  . hydrochlorothiazide (HYDRODIURIL) 25 MG tablet Take 1 tablet (25 mg total) by mouth daily. 90 tablet 0  . losartan (COZAAR) 50 MG tablet Take 1 tablet (50 mg total) by mouth daily. 90 tablet 3  . metoprolol succinate (TOPROL-XL) 100 MG 24 hr tablet Take 1 tablet (100 mg total) by mouth daily. Take with or immediately following a meal. 90 tablet 1  . sertraline (ZOLOFT) 50 MG tablet Take 1 tablet (50 mg total) by mouth daily. Take 1/2 tab daily for 1 week and then go to 1 tab daily if tolerated. 30 tablet 0  . doxycycline (VIBRA-TABS) 100 MG tablet Take 1 tablet (100 mg total) by mouth 2 (two) times daily. (Patient not taking: Reported on 11/20/2019) 14 tablet 0   No facility-administered medications prior to visit.    Allergies  Allergen Reactions  . Neosporin [Neomycin-Bacitracin Zn-Polymyx] Anaphylaxis  . Penicillins Nausea And Vomiting, Rash and Other (See  Comments)    Has patient had a PCN reaction causing immediate rash, facial/tongue/throat swelling, SOB or lightheadedness with hypotension: Yes Has patient had a PCN reaction causing severe rash involving mucus membranes or skin necrosis: Yes Has patient had a PCN reaction that required hospitalization Yes Has patient had a PCN reaction occurring within the last 10 years: No If all of the above answers are "NO", then may proceed with Cephalosporin use.   Erica Ramos    Review of Systems  Constitutional: Negative for chills and fever.  HENT: Negative.   Respiratory: Negative.   Cardiovascular: Negative for chest pain, palpitations and leg swelling.  Endocrine: Negative.   Genitourinary: Negative.   Allergic/Immunologic: Positive for environmental allergies.  Neurological: Negative.   Psychiatric/Behavioral:       Patient ran out of her Zoloft over the last month.  It definitely was helping her and she feels a lot more anxious with poorer coping stress.  She is sleeping poorly.  She is requesting refills on her medication.  No SI or HI.      Objective:    Physical Exam Vitals reviewed.  Constitutional:      Appearance: She is obese.  Cardiovascular:     Rate and Rhythm: Normal rate and regular rhythm.     Heart sounds: Normal heart sounds.  Pulmonary:     Effort: Pulmonary effort is normal.     Breath sounds: Normal breath sounds.  Abdominal:     Palpations: Abdomen is soft.  Musculoskeletal:        General: No swelling. Normal range of motion.     Right lower leg: No edema.     Left lower leg: No edema.  Skin:    General: Skin is warm and dry.     Findings: No erythema,  lesion or rash.  Neurological:     General: No focal deficit present.     Mental Status: She is alert and oriented to person, place, and time.  Psychiatric:     Comments: Tearful when talking about life stressors and her frustration with weight gain . She would like to speak with a therapist and   [facility] team.      BP 138/82 (BP Location: Left Arm, Patient Position: Sitting, Cuff Size: Large)   Pulse 61   Temp 97.9 F (36.6 C) (Oral)   Ht 5' 2"  (1.575 m)   Wt 276 lb (125.2 kg)   SpO2 98%   BMI 50.48 kg/m  Wt Readings from Last 3 Encounters:  11/20/19 276 lb (125.2 kg)  10/22/19 274 lb 1.6 oz (124.3 kg)  09/17/19 269 lb (122 kg)    There are no preventive care reminders to display for this patient.  There are no preventive care reminders to display for this patient.  Lab Results  Component Value Date   TSH 2.060 08/09/2019   Lab Results  Component Value Date   WBC 8.6 11/08/2018   HGB 12.8 11/08/2018   HCT 39.5 11/08/2018   MCV 80.7 11/08/2018   PLT 300.0 11/08/2018   Lab Results  Component Value Date   NA 138 08/09/2019   K 3.9 08/09/2019   CO2 23 08/09/2019   GLUCOSE 94 08/09/2019   BUN 8 08/09/2019   CREATININE 0.79 08/09/2019   BILITOT 0.3 08/09/2019   ALKPHOS 83 08/09/2019   AST 19 08/09/2019   ALT 16 08/09/2019   PROT 6.8 08/09/2019   ALBUMIN 4.1 08/09/2019   CALCIUM 9.6 08/09/2019   ANIONGAP 11 09/20/2017   GFR 133.32 03/15/2019   Lab Results  Component Value Date   CHOL 171 03/15/2019   Lab Results  Component Value Date   HDL 56.70 03/15/2019   Lab Results  Component Value Date   LDLCALC 97 03/15/2019   Lab Results  Component Value Date   TRIG 87.0 03/15/2019   Lab Results  Component Value Date   CHOLHDL 3 03/15/2019   Lab Results  Component Value Date   HGBA1C 6.1 (H) 08/09/2019      Assessment & Plan:   Problem List Items Addressed This Visit      Cardiovascular and Mediastinum   Essential hypertension - Primary   Relevant Medications   losartan (COZAAR) 50 MG tablet   hydrochlorothiazide (HYDRODIURIL) 25 MG tablet   metoprolol succinate (TOPROL-XL) 100 MG 24 hr tablet   Other Relevant Orders   Comp Met (CMET)   CBC w/Diff     Other   Morbid obesity ([place])   Relevant Orders   Amb Ref to  Medical Weight Management   Pre-diabetes   Relevant Orders   Hemoglobin A1c    Other Visit Diagnoses    Vitamin D deficiency       Relevant Orders   Vitamin D (25 hydroxy)   Anxiety and depression       Relevant Medications   sertraline (ZOLOFT) 50 MG tablet   Other Relevant Orders   Ambulatory referral to Psychology      Meds ordered this encounter  Medications  . losartan (COZAAR) 50 MG tablet    Sig: Take 1 tablet (50 mg total) by mouth daily.    Dispense:  90 tablet    Refill:  3    Order Specific Question:   Supervising Provider    Answer:  Einar Pheasant [[id number]]  . hydrochlorothiazide (HYDRODIURIL) 25 MG tablet    Sig: Take 1 tablet (25 mg total) by mouth daily.    Dispense:  90 tablet    Refill:  0    Order Specific Question:   Supervising Provider    Answer:   Einar Pheasant [id number]  . sertraline (ZOLOFT) 50 MG tablet    Sig: Take 1 tablet (50 mg total) by mouth daily. Take 1/2 tab daily for 1 week and then go to 1 tab daily if tolerated.    Dispense:  90 tablet    Refill:  3    Order Specific Question:   Supervising Provider    Answer:   Einar Pheasant [id number]  . metoprolol succinate (TOPROL-XL) 100 MG 24 hr tablet    Sig: Take 1 tablet (100 mg total) by mouth daily. Take with or immediately following a meal.    Dispense:  90 tablet    Refill:  1    Order Specific Question:   Supervising Provider    Answer:   Einar Pheasant [[id number]]    Please check your blood pressure at home with the large cuff or extra-large cuff size (you will see the marking guide on the cuff)  and record your BP readings.   Call in with the blood pressure readings.  We will make decision regarding adjusting your blood pressure medication after we look at your home readings.  I refilled your Zoloft, you clearly had improvement on just the 50 mg dose.  I placed referral in for behavioral health counseling  I placed my referral in for the weight management team  Continue with  your plans for your mammogram and Pap test as planned.   Follow-up office visit in 1 to 2 months. Follow-up: No follow-ups on file.   This visit occurred during the SARS-CoV-2 public health emergency.  Safety protocols were in place, including screening questions prior to the visit, additional usage of staff PPE, and extensive cleaning of exam room while observing appropriate contact time as indicated for disinfecting solutions.   Denice Paradise, NP

## 2019-11-20 NOTE — Patient Instructions (Addendum)
Lab work today.   Please check your blood pressure at home with the large cuff or extra-large cuff size (you will see the marking guide on the cuff)  and record your BP readings.   Call in with the blood pressure readings.  We will make decision regarding adjusting your blood pressure medication after we look at your home readings.  I refilled your Zoloft, you clearly had improvement on just the 50 mg dose.  I placed referral in for behavioral health counseling  I placed my referral in for the weight management team  Continue with your plans for your mammogram and Pap test as planned.   Follow-up office visit in 1 to 2 months.

## 2019-11-20 NOTE — Telephone Encounter (Signed)
Patient called asking if the prescription(s) you were giving her and/or changing were sent in yet. Patient is about to leave to go out of town and needs them sent asap so she can pick them up. Also needs a refill on her zoloft. Send to [facility] on [address]

## 2019-11-20 NOTE — Telephone Encounter (Signed)
I spoke to her and informed her the refills were done.

## 2019-11-21 ENCOUNTER — Other Ambulatory Visit: Payer: Self-pay | Admitting: Nurse Practitioner

## 2019-11-21 DIAGNOSIS — I10 Essential (primary) hypertension: Secondary | ICD-10-CM

## 2019-11-21 LAB — COMPREHENSIVE METABOLIC PANEL
ALT: 18 IU/L (ref 0–32)
AST: 18 IU/L (ref 0–40)
Albumin/Globulin Ratio: 1.4 (ref 1.2–2.2)
Albumin: 4.1 g/dL (ref 3.8–4.8)
Alkaline Phosphatase: 72 IU/L (ref 48–121)
BUN/Creatinine Ratio: 19 (ref 9–23)
BUN: 14 mg/dL (ref 6–24)
Bilirubin Total: 0.3 mg/dL (ref 0.0–1.2)
CO2: 23 mmol/L (ref 20–29)
Calcium: 9.3 mg/dL (ref 8.7–10.2)
Chloride: 104 mmol/L (ref 96–106)
Creatinine, Ser: 0.75 mg/dL (ref 0.57–1.00)
GFR calc Af Amer: 114 mL/min/{1.73_m2} (ref >59)
GFR calc non Af Amer: 99 mL/min/{1.73_m2} (ref >59)
Globulin, Total: 2.9 g/dL (ref 1.5–4.5)
Glucose: 99 mg/dL (ref 65–99)
Potassium: 4.9 mmol/L (ref 3.5–5.2)
Sodium: 140 mmol/L (ref 134–144)
Total Protein: 7 g/dL (ref 6.0–8.5)

## 2019-11-21 LAB — HEMOGLOBIN A1C
Est. average glucose Bld gHb Est-mCnc: 120 mg/dL
Hgb A1c MFr Bld: 5.8 % — ABNORMAL HIGH (ref 4.8–5.6)

## 2019-11-21 LAB — CBC WITH DIFFERENTIAL/PLATELET
Basophils Absolute: 0.1 10*3/uL (ref 0.0–0.2)
Basos: 1 %
EOS (ABSOLUTE): 0.1 10*3/uL (ref 0.0–0.4)
Eos: 1 %
Hematocrit: 40.2 % (ref 34.0–46.6)
Hemoglobin: 12.7 g/dL (ref 11.1–15.9)
Immature Grans (Abs): 0 10*3/uL (ref 0.0–0.1)
Immature Granulocytes: 0 %
Lymphocytes Absolute: 2 10*3/uL (ref 0.7–3.1)
Lymphs: 26 %
MCH: 25.2 pg — ABNORMAL LOW (ref 26.6–33.0)
MCHC: 31.6 g/dL (ref 31.5–35.7)
MCV: 80 fL (ref 79–97)
Monocytes Absolute: 0.6 10*3/uL (ref 0.1–0.9)
Monocytes: 8 %
Neutrophils Absolute: 4.8 10*3/uL (ref 1.4–7.0)
Neutrophils: 64 %
Platelets: 310 10*3/uL (ref 150–450)
RBC: 5.03 x10E6/uL (ref 3.77–5.28)
RDW: 16 % — ABNORMAL HIGH (ref 11.7–15.4)
WBC: 7.7 10*3/uL (ref 3.4–10.8)

## 2019-11-21 LAB — VITAMIN D 25 HYDROXY (VIT D DEFICIENCY, FRACTURES): Vit D, 25-Hydroxy: 49.4 ng/mL (ref 30.0–100.0)

## 2019-11-21 NOTE — Addendum Note (Signed)
Addended by: Warden Fillers on: 11/21/2019 08:17 AM   Modules accepted: Orders

## 2019-11-22 LAB — LIPID PANEL
Chol/HDL Ratio: 2.9 ratio (ref 0.0–4.4)
Cholesterol, Total: 151 mg/dL (ref 100–199)
HDL: 52 mg/dL (ref >39)
LDL Chol Calc (NIH): 86 mg/dL (ref 0–99)
Triglycerides: 67 mg/dL (ref 0–149)
VLDL Cholesterol Cal: 13 mg/dL (ref 5–40)

## 2019-11-22 LAB — SPECIMEN STATUS REPORT

## 2019-11-25 ENCOUNTER — Encounter: Payer: Self-pay | Admitting: Nurse Practitioner

## 2019-12-09 ENCOUNTER — Encounter (INDEPENDENT_AMBULATORY_CARE_PROVIDER_SITE_OTHER): Payer: Self-pay

## 2019-12-11 ENCOUNTER — Ambulatory Visit
Admission: RE | Admit: 2019-12-11 | Discharge: 2019-12-11 | Disposition: A | Discharge status: Home / Self Care | Payer: Managed Care, Other (non HMO) | Source: Ambulatory Visit | Attending: Nurse Practitioner | Admitting: Nurse Practitioner

## 2019-12-11 DIAGNOSIS — Z1231 Encounter for screening mammogram for malignant neoplasm of breast: Secondary | ICD-10-CM | POA: Diagnosis present

## 2019-12-11 DIAGNOSIS — Z124 Encounter for screening for malignant neoplasm of cervix: Secondary | ICD-10-CM

## 2019-12-12 ENCOUNTER — Encounter: Payer: Self-pay | Admitting: Obstetrics and Gynecology

## 2019-12-12 ENCOUNTER — Ambulatory Visit (INDEPENDENT_AMBULATORY_CARE_PROVIDER_SITE_OTHER): Payer: BC Managed Care – PPO | Admitting: Family Medicine

## 2019-12-13 ENCOUNTER — Telehealth: Payer: Self-pay | Admitting: Nurse Practitioner

## 2019-12-13 NOTE — Telephone Encounter (Signed)
Pt called in wanted to know if she could stop take sertraline (ZOLOFT) 50 MG tablet long enough to get tooth pain under control would like a call back

## 2019-12-13 NOTE — Telephone Encounter (Signed)
Yes, she can take ibuprofen over the weekend  after dentist visit and she can hold the Zoloft for 1-2 days . There should be no problem as this is a very short duration of NSAID use.

## 2019-12-18 ENCOUNTER — Other Ambulatory Visit: Payer: Self-pay

## 2019-12-18 ENCOUNTER — Encounter: Payer: Self-pay | Admitting: Nurse Practitioner

## 2019-12-18 ENCOUNTER — Telehealth (INDEPENDENT_AMBULATORY_CARE_PROVIDER_SITE_OTHER): Payer: Managed Care, Other (non HMO) | Admitting: Nurse Practitioner

## 2019-12-18 ENCOUNTER — Encounter: Payer: Managed Care, Other (non HMO) | Admitting: Nurse Practitioner

## 2019-12-18 DIAGNOSIS — I10 Essential (primary) hypertension: Secondary | ICD-10-CM

## 2019-12-18 DIAGNOSIS — R7303 Prediabetes: Secondary | ICD-10-CM

## 2019-12-18 NOTE — Progress Notes (Addendum)
Virtual Visit via telephone Note  This visit type was conducted due to national recommendations for restrictions regarding the COVID-19 pandemic (e.g. social distancing).  This format is felt to be most appropriate for this patient at this time.  All issues noted in this document were discussed and addressed.  No physical exam was performed (except for noted visual exam findings with Video Visits).   I connected with@ on 12/22/19 at 11:45 AM EDT by a video enabled telemedicine application or telephone and verified that I am speaking with the correct person using two identifiers. Location patient: home Location provider: work or home office Persons participating in the virtual visit: patient, provider  I discussed the limitations, risks, security and privacy concerns of performing an evaluation and management service by telephone and the availability of in person appointments. I also discussed with the patient that there may be a patient responsible charge related to this service. The patient expressed understanding and agreed to proceed.  Interactive audio and video telecommunications were attempted between this provider and patient, however failed, due to patient  did not have access to video capability.  We continued and completed visit with audio only but we had to re- schedule her 9 am appt to 11:45 today in order to complete it as a telephone visit.   Reason for visit: routine f/up of HTN and she has no complaints. Recent labs WNL. Pre- DM   HPI:  Essential HTN:Not at goal. Taking losartan 50 mg daily, HCTZ 25 mg daily, metoprolol succinate 100 mg daily.  Reports medication compliance.  No CP/DOE/SOB.   BP in May 138/80 and she was on nifedipine when pregnant. We changed to amlodipine 5 mg in May.    BP June 140/80. No improvement on amlodipine 5 mg daily and desire to limit th number of BP meds. Amlodipine was stopped and losartan 50 mg started on 10/22/2019.   BP July 138/82 with  labs  returned WNL for chemistry panel, liver function, kidney function, cholesterol panel, uric acid level, and high normal Vit D level, CBC.   Currently, she has been dealing with a bad tooth and taking high doses of ibuprofen daily. Seeing a dentist and plans to have the tooth extracted. Her BP has been elevated 150 systolic last Friday. She checked it at home today and elevated 175/87 . She is stressed. Not having as much tooth  pain and she can hold the ibuprofen and use Tylenol instead. The gum inflammation is better. She will monitor her BP at home for 3 days and call with readings.    BP Readings from Last 3 Encounters:  12/18/19 (!) 175/87  11/20/19 138/82  10/22/19 140/84   Pulse Readings from Last 3 Encounters:  12/18/19 60  11/20/19 61  10/22/19 72    Lab Results  Component Value Date   LABMICR 16.8 08/09/2019   MICROALBUR <0.7 10/30/2014   BMI 50/Morbid Obesity/Pre DM: Not at goal. Reported wt loss of 8 lbs in May. The A1c- 3 mos average blood sugar - is still in pre diabetes range-5.8 , but improved form 6.1. Has declined Metformin. She may consider a GLP- 1 receptor agonist.   Wt Readings from Last 3 Encounters:  12/18/19 275 lb (124.7 kg)  11/20/19 276 lb (125.2 kg)  10/22/19 274 lb 1.6 oz (124.3 kg)     Immunizations: Needs flu shot when available.  Diet:Trying -not following a plan  Exercise: on and off Colonoscopy: Screening age 47- not done Dexa: NA Pap  Smear: set up for GYN appt 01/02/20 Mammogram:12/11/2019  ROS: See pertinent positives and negatives per HPI.  Past Medical History:  Diagnosis Date  . Abscess of axilla, left 05/01/2013  . Anxiety   . Boil, breast 10/22/2019  . Bronchitis    chronic - has flare every December  . Cervical polyp    hx of  . Chest pain 05/01/2013  . Delivered by cesarean section 06/05/2016  . Depression, major, single episode, moderate (HCC) 04/19/2018  . Environmental allergies   . Genital warts   . GERD (gastroesophageal  reflux disease)    Per MD chart note 12/01/2010  . Heart murmur   . Hx of abnormal Pap smear 2012  . Hypertension   . Morbid obesity (HCC)    BMI 47.9  . Persistent headaches    worst during menstrual cycle  . PVC (premature ventricular contraction) 02/20/2015  . STD (sexually transmitted disease)    hx genital warts/?HSV  . Vaginal bleeding 07/15/2018  . Vaginal Pap smear, abnormal     Past Surgical History:  Procedure Laterality Date  . CESAREAN SECTION N/A 06/05/2016   Procedure: CESAREAN SECTION;  Surgeon: Hoover Browns, MD;  Location: [facility];  Service: Obstetrics;  Laterality: N/A;  . CESAREAN SECTION N/A 09/20/2017   Procedure: CESAREAN SECTION;  Surgeon: Gerald Leitz, MD;  Location: [facility]TH[facility] SU[facility];  Service: Obstetrics;  Laterality: N/A;  EDD 10/02/17  . COLPOSCOPY  2012   no treatment to cervix--pap smears reverted to normal  . WISDOM TOOTH EXTRACTION      Family History  Problem Relation Age of Onset  . Diabetes Mother   . Hypertension Mother        Under control  . Diabetes Maternal Aunt   . Seizures Maternal Aunt   . Diabetes Maternal Grandmother   . Hypertension Maternal Grandmother   . Alcohol abuse Father   . Diabetes Father   . Hypertension Sister   . Diabetes Maternal Uncle   . Hypertension Other   . Obesity Other   . Heart attack Paternal Grandmother   . COPD Neg Hx     SOCIAL HX: never smoked   Current Outpatient Medications:  .  acetaminophen (TYLENOL) 500 MG tablet, Take 500-1,000 mg by mouth daily as needed for headache. , Disp: , Rfl:  .  calcium carbonate (TUMS - DOSED IN MG ELEMENTAL CALCIUM) 500 MG chewable tablet, Chew 3-4 tablets by mouth daily as needed for indigestion or heartburn., Disp: , Rfl:  .  cetirizine (ZYRTEC) 10 MG tablet, Take 1 tablet (10 mg total) by mouth daily., Disp: 90 tablet, Rfl: 3 .  GARLIC PO, Take 1 capsule by mouth daily., Disp: , Rfl:  .  hydrochlorothiazide (HYDRODIURIL) 25 MG tablet, TAKE 1 TABLET(25 MG) BY  MOUTH DAILY, Disp: 90 tablet, Rfl: 0 .  losartan (COZAAR) 50 MG tablet, Take 1 tablet (50 mg total) by mouth daily., Disp: 90 tablet, Rfl: 3 .  metoprolol succinate (TOPROL-XL) 100 MG 24 hr tablet, Take 1 tablet (100 mg total) by mouth daily. Take with or immediately following a meal., Disp: 90 tablet, Rfl: 1 .  omeprazole (PRILOSEC) 20 MG capsule, Take 1 capsule (20 mg total) by mouth daily., Disp: 30 capsule, Rfl: 5 .  Prenatal Vit-Fe Fumarate-FA (MULTIVITAMIN-PRENATAL) 27-0.8 MG TABS tablet, Take 1 tablet by mouth daily. , Disp: , Rfl:  .  sertraline (ZOLOFT) 50 MG tablet, Take 1 tablet (50 mg total) by mouth daily. Take 1/2 tab daily for 1  week and then go to 1 tab daily if tolerated., Disp: 90 tablet, Rfl: 3 .  triamcinolone (NASACORT AQ) 55 MCG/ACT AERO nasal inhaler, Place 2 sprays into the nose daily. (Patient taking differently: Place 1 spray into the nose daily. ), Disp: 1 Inhaler, Rfl: 12 .  triamcinolone cream (KENALOG) 0.1 %, Apply 1 application topically 2 (two) times daily as needed., Disp: 30 g, Rfl: 0  EXAM:  VITALS per patient if applicable:  GENERAL: alert, oriented, appears well and in no acute distress  HEENT: atraumatic, conjunctiva clear, no obvious abnormalities on inspection of external nose and ears  NECK: normal movements of the head and neck  LUNGS: on inspection no signs of respiratory distress, breathing rate appears normal, no obvious gross SOB, gasping or wheezing  CV: no obvious cyanosis  MS: moves all visible extremities without noticeable abnormality  PSYCH/NEURO: pleasant and cooperative, no obvious depression or anxiety, speech and thought processing grossly intact  ASSESSMENT AND PLAN:  Discussed the following assessment and plan:  Essential hypertension  Morbid obesity (HCC)  Pre-diabetes  No problem-specific Assessment & Plan notes found for this encounter.  Advised low sodium diet. Stop the ibuprofen and monitor BPx3 and call in  readings.Continue with weight loss and will consider  Cardiology consult- echo in 2016- mild LVH, mild dilated RA, Normal LVEF.  I would like to see her BP <130/85. Normal renal fn and negative urine micro albumin.  Not actively losing wight. Not exercising like she was. If she agrees, referral to [facility] team. Consider adding GLP-1 receptor agonist. She has new GYN appt 01/02/20 for pap.  I discussed the assessment and treatment plan with the patient. The patient was provided an opportunity to ask questions and all were answered. The patient agreed with the plan and demonstrated an understanding of the instructions.   The patient was advised to call back or seek an in-person evaluation if the symptoms worsen or if the condition fails to improve as anticipated.  I provided 14  minutes of non-face-to-face time during this encounter.  Amedeo Kinsman, NP  Adult Nurse Practitioner [facility] [employer] [phone]

## 2019-12-18 NOTE — Progress Notes (Deleted)
Virtual Visit via telephone  Note  This visit type was conducted due to national recommendations for restrictions regarding the COVID-19 pandemic (e.g. social distancing).  This format is felt to be most appropriate for this patient at this time.  All issues noted in this document were discussed and addressed.  No physical exam was performed (except for noted visual exam findings with Video Visits).   I connected with@ on 12/18/19 at  9:00 AM EDT by a video enabled telemedicine application or telephone and verified that I am speaking with the correct person using two identifiers. Location patient: home Location provider: work or home office Persons participating in the virtual visit: patient, provider  I discussed the limitations, risks, security and privacy concerns of performing an evaluation and management service by telephone and the availability of in person appointments. I also discussed with the patient that there may be a patient responsible charge related to this service. The patient expressed understanding and agreed to proceed.  Interactive audio and video telecommunications were attempted between this provider and patient, however failed, due to patient having technical difficulties OR patient did not have access to video capability.  We continued and completed visit with audio only.   Reason for visit: ***  HPI: ***   ROS: See pertinent positives and negatives per HPI.  Past Medical History:  Diagnosis Date   Abscess of axilla, left 05/01/2013   Anxiety    Boil, breast 10/22/2019   Bronchitis    chronic - has flare every December   Cervical polyp    hx of   Chest pain 05/01/2013   Delivered by cesarean section 06/05/2016   Depression, major, single episode, moderate (HCC) 04/19/2018   Environmental allergies    Genital warts    GERD (gastroesophageal reflux disease)    Per MD chart note 12/01/2010   Heart murmur    Hx of abnormal Pap smear 2012    Hypertension    Morbid obesity (HCC)    BMI 47.9   Persistent headaches    worst during menstrual cycle   PVC (premature ventricular contraction) 02/20/2015   STD (sexually transmitted disease)    hx genital warts/?HSV   Vaginal bleeding 07/15/2018   Vaginal Pap smear, abnormal     Past Surgical History:  Procedure Laterality Date   CESAREAN SECTION N/A 06/05/2016   Procedure: CESAREAN SECTION;  Surgeon: Hoover Browns, MD;  Location: [facility];  Service: Obstetrics;  Laterality: N/A;   CESAREAN SECTION N/A 09/20/2017   Procedure: CESAREAN SECTION;  Surgeon: Gerald Leitz, MD;  Location: [facility] BIRTHING SUITES;  Service: Obstetrics;  Laterality: N/A;  EDD 10/02/17   COLPOSCOPY  2012   no treatment to cervix--pap smears reverted to normal   WISDOM TOOTH EXTRACTION      Family History  Problem Relation Age of Onset   Diabetes Mother    Hypertension Mother        Under control   Diabetes Maternal Aunt    Seizures Maternal Aunt    Diabetes Maternal Grandmother    Hypertension Maternal Grandmother    Alcohol abuse Father    Diabetes Father    Hypertension Sister    Diabetes Maternal Uncle    Hypertension Other    Obesity Other    Heart attack Paternal Grandmother    COPD Neg Hx     SOCIAL HX: ***   Current Outpatient Medications:    acetaminophen (TYLENOL) 500 MG tablet, Take 500-1,000 mg by mouth daily as needed for  headache. , Disp: , Rfl:    calcium carbonate (TUMS - DOSED IN MG ELEMENTAL CALCIUM) 500 MG chewable tablet, Chew 3-4 tablets by mouth daily as needed for indigestion or heartburn., Disp: , Rfl:    cetirizine (ZYRTEC) 10 MG tablet, Take 1 tablet (10 mg total) by mouth daily., Disp: 90 tablet, Rfl: 3   GARLIC PO, Take 1 capsule by mouth daily., Disp: , Rfl:    hydrochlorothiazide (HYDRODIURIL) 25 MG tablet, TAKE 1 TABLET(25 MG) BY MOUTH DAILY, Disp: 90 tablet, Rfl: 0   losartan (COZAAR) 50 MG tablet, Take 1 tablet (50 mg total) by mouth  daily., Disp: 90 tablet, Rfl: 3   metoprolol succinate (TOPROL-XL) 100 MG 24 hr tablet, Take 1 tablet (100 mg total) by mouth daily. Take with or immediately following a meal., Disp: 90 tablet, Rfl: 1   omeprazole (PRILOSEC) 20 MG capsule, Take 1 capsule (20 mg total) by mouth daily., Disp: 30 capsule, Rfl: 5   Prenatal Vit-Fe Fumarate-FA (MULTIVITAMIN-PRENATAL) 27-0.8 MG TABS tablet, Take 1 tablet by mouth daily. , Disp: , Rfl:    sertraline (ZOLOFT) 50 MG tablet, Take 1 tablet (50 mg total) by mouth daily. Take 1/2 tab daily for 1 week and then go to 1 tab daily if tolerated., Disp: 90 tablet, Rfl: 3   triamcinolone (NASACORT AQ) 55 MCG/ACT AERO nasal inhaler, Place 2 sprays into the nose daily. (Patient taking differently: Place 1 spray into the nose daily. ), Disp: 1 Inhaler, Rfl: 12   triamcinolone cream (KENALOG) 0.1 %, Apply 1 application topically 2 (two) times daily as needed., Disp: 30 g, Rfl: 0  EXAM:  VITALS per patient if applicable:  GENERAL: alert, oriented, appears well and in no acute distress  HEENT: atraumatic, conjunttiva clear, no obvious abnormalities on inspection of external nose and ears  NECK: normal movements of the head and neck  LUNGS: on inspection no signs of respiratory distress, breathing rate appears normal, no obvious gross SOB, gasping or wheezing  CV: no obvious cyanosis  MS: moves all visible extremities without noticeable abnormality  PSYCH/NEURO: pleasant and cooperative, no obvious depression or anxiety, speech and thought processing grossly intact  ASSESSMENT AND PLAN:  Discussed the following assessment and plan:  No diagnosis found.  No problem-specific Assessment & Plan notes found for this encounter.    I discussed the assessment and treatment plan with the patient. The patient was provided an opportunity to ask questions and all were answered. The patient agreed with the plan and demonstrated an understanding of the  instructions.   The patient was advised to call back or seek an in-person evaluation if the symptoms worsen or if the condition fails to improve as anticipated.  I provided *** minutes of non-face-to-face time during this encounter.

## 2019-12-22 ENCOUNTER — Encounter: Payer: Self-pay | Admitting: Nurse Practitioner

## 2019-12-22 ENCOUNTER — Telehealth: Payer: Self-pay | Admitting: Nurse Practitioner

## 2019-12-22 DIAGNOSIS — I493 Ventricular premature depolarization: Secondary | ICD-10-CM

## 2019-12-22 DIAGNOSIS — I10 Essential (primary) hypertension: Secondary | ICD-10-CM

## 2019-12-22 DIAGNOSIS — R7303 Prediabetes: Secondary | ICD-10-CM

## 2019-12-22 NOTE — Addendum Note (Signed)
Addended by: Amedeo Kinsman A on: 12/22/2019 05:35 PM   Modules accepted: Orders

## 2019-12-22 NOTE — Telephone Encounter (Addendum)
Called pt for BP update off of ibuprofen for 3 days. She says it is better. She is getting her tooth pulled tomorrow. She will check her readings and report specific readings when she gets home in 3 hours.    5:08 pm:  I called her back and her BP is running 159/86. We discussed and she is aware.   PLAN:  1.   Increase Losartan to 75 mg daily. Stay on the same HCTZ and metoprolol. Daily BP checks and call with report on Thurs. Labs will need done in 2 weeks for Bmet.  Normal renal panel and no micro proteinuria.   Echo in 2016:. Mild LVH and mild RA enlargement. She is in agreement to get back to Cardiology for a recheck and to f/up on HTN management. I placed referral.   She needs help with wt loss and cannot afford the [facility] Wt Loss plan. BMI 50. She has pre-DM. She is agreeable to a referral to CCM  to look into starting  Ozempic  and more dietary  teaching. I place referral.   PLAN:  1. She is aware of Losartan dose 75 mg and BP checks with  need for labs  2.  Please make her a lab appt in 2 weeks for Bmet and call her with that date.   3.  Please reinstate her on my schedule  12/18/19- NOT a NO SHOW because we did a telephone visit on 8/18/ 21 at 11:45.

## 2019-12-22 NOTE — Patient Instructions (Addendum)
BP is elevated on ibuprofen. Stop the ibuprofen and check BP daily x 3 and call with readings. Low sodium diet. . I would like to see your BP around or less than 130/85 for a goal.   Encouraged healthy diet and exercise- already given information.   Follow up with GYN for PAP as you plan.   F/up BP in 1 month- with home readings

## 2019-12-23 ENCOUNTER — Telehealth: Payer: Self-pay | Admitting: *Deleted

## 2019-12-23 NOTE — Telephone Encounter (Signed)
Forms have been faxed 

## 2019-12-23 NOTE — Telephone Encounter (Signed)
Forms in quick sign folder

## 2019-12-23 NOTE — Telephone Encounter (Signed)
Completed dentist form.

## 2019-12-23 NOTE — Telephone Encounter (Signed)
Pt states that dentist is going to send a medical clearance to our office due to change in bp medication. Please advise

## 2019-12-23 NOTE — Chronic Care Management (AMB) (Signed)
  Care Management   Outreach Note  12/23/2019 Name: Erica Ramos MRN: [id number] DOB: April 06, 1976  Erica Ramos is a 44 y.o. year old female who is a primary care patient of Theadore Nan, NP. I reached out to [name] by phone today in response to a referral sent by [name]'s PCP, Theadore Nan, NP.     An unsuccessful telephone outreach was attempted today. The patient was referred to the case management team for assistance with care management and care coordination.   Follow Up Plan: A HIPPA compliant phone message was left for the patient providing contact information and requesting a return call. The care management team will reach out to the patient again over the next 7 days. If patient returns call to provider office, please advise to call Embedded Care Management Care Guide Gwenevere Ghazi at [phone].  Gwenevere Ghazi  Care Guide, Embedded Care Coordination [facility]  [place], [place] [address] Direct Dial: [phone] [email or web address] Website: [email or web address]

## 2019-12-26 ENCOUNTER — Ambulatory Visit (INDEPENDENT_AMBULATORY_CARE_PROVIDER_SITE_OTHER): Payer: BC Managed Care – PPO | Admitting: Family Medicine

## 2019-12-30 NOTE — Progress Notes (Signed)
Erroneous-note.  She was seen at a later time today.

## 2019-12-30 NOTE — Telephone Encounter (Signed)
lmtcb

## 2019-12-30 NOTE — Telephone Encounter (Signed)
Erica Ramos:  1. She needs a lab appt for Bmet this week.   2. Please call her for BP update and confirm what BP meds she is taking now.   3.  Please  increase her losartan to 50 mg twice daily and advise her  take the second dose at bedtime.    4. She needs OV in person  to check her BP in the next 3-4 weeks.   Marland Kitchen

## 2019-12-30 NOTE — Chronic Care Management (AMB) (Signed)
  Care Management   Note  12/30/2019 Name: Erica Ramos MRN: [id number] DOB: Jan 28, 1976  Erica Ramos is a 44 y.o. year old female who is a primary care patient of Theadore Nan, NP. I reached out to [name] by phone today in response to a referral sent by Ms. [name]'s health plan.    [name] was given information about care management services today including:  1. Care management services include personalized support from designated clinical staff supervised by her physician, including individualized plan of care and coordination with other care providers 2. 24/7 contact phone numbers for assistance for urgent and routine care needs. 3. The patient may stop care management services at any time by phone call to the office staff.  Patient agreed to services and verbal consent obtained.   Follow up plan: Telephone appointment with care management team member scheduled for:01/24/2020  [facility] Guide, Embedded Care Coordination [facility]  [place], [place] [address] Direct Dial: [phone] [email or web address] Website: [email or web address]

## 2019-12-31 NOTE — Telephone Encounter (Signed)
LMTCB

## 2020-01-01 NOTE — Telephone Encounter (Signed)
Okay thanks [name]. I have patient scheduled for her BP follow up on 01/24/20 at 9 am

## 2020-01-01 NOTE — Telephone Encounter (Signed)
Yes, of course! I've changed my appt to face to face.   Erica Ramos

## 2020-01-01 NOTE — Telephone Encounter (Signed)
Spoke with patient and her BP hasnt been too good as she had more problems with her teeth and had to have tooth pulled Monday so she has been taking advil. Today was her first day without any advil and her BP was 162/90 this am before taking her meds. She is taking losartan 75 mg every am and then metroprolol 100mg  QHS. I advised her to take losartan 50mg  BID with second dose at night. She wants to make sure you are aware she is already taking metroprolol 100 mg at night; do you want her to take them both at night. Scheduled her to have labs done on 01/02/20 at 9:15 am. She wants to come in for her OV on 01/24/20 but has visit with [name] at 9am via telephone. [name] can we do your visit and ours together that day or should I schedule her at another time?

## 2020-01-02 ENCOUNTER — Encounter: Payer: Self-pay | Admitting: Obstetrics and Gynecology

## 2020-01-02 ENCOUNTER — Other Ambulatory Visit: Payer: Managed Care, Other (non HMO)

## 2020-01-03 ENCOUNTER — Other Ambulatory Visit: Payer: Managed Care, Other (non HMO)

## 2020-01-08 ENCOUNTER — Other Ambulatory Visit: Payer: Self-pay

## 2020-01-08 ENCOUNTER — Other Ambulatory Visit (INDEPENDENT_AMBULATORY_CARE_PROVIDER_SITE_OTHER): Payer: Managed Care, Other (non HMO)

## 2020-01-08 DIAGNOSIS — I10 Essential (primary) hypertension: Secondary | ICD-10-CM

## 2020-01-08 NOTE — Addendum Note (Signed)
Addended by: Warden Fillers on: 01/08/2020 09:39 AM   Modules accepted: Orders

## 2020-01-09 ENCOUNTER — Other Ambulatory Visit: Payer: Self-pay | Admitting: Nurse Practitioner

## 2020-01-09 ENCOUNTER — Encounter: Payer: Self-pay | Admitting: Nurse Practitioner

## 2020-01-09 LAB — LIPID PANEL
Chol/HDL Ratio: 2.5 ratio (ref 0.0–4.4)
Cholesterol, Total: 137 mg/dL (ref 100–199)
HDL: 54 mg/dL (ref >39)
LDL Chol Calc (NIH): 67 mg/dL (ref 0–99)
Triglycerides: 80 mg/dL (ref 0–149)
VLDL Cholesterol Cal: 16 mg/dL (ref 5–40)

## 2020-01-09 LAB — BASIC METABOLIC PANEL
BUN/Creatinine Ratio: 16 (ref 9–23)
BUN: 13 mg/dL (ref 6–24)
CO2: 26 mmol/L (ref 20–29)
Calcium: 9.4 mg/dL (ref 8.7–10.2)
Chloride: 105 mmol/L (ref 96–106)
Creatinine, Ser: 0.8 mg/dL (ref 0.57–1.00)
GFR calc Af Amer: 106 mL/min/{1.73_m2} (ref >59)
GFR calc non Af Amer: 92 mL/min/{1.73_m2} (ref >59)
Glucose: 131 mg/dL — ABNORMAL HIGH (ref 65–99)
Potassium: 4.3 mmol/L (ref 3.5–5.2)
Sodium: 140 mmol/L (ref 134–144)

## 2020-01-09 MED ORDER — LOSARTAN POTASSIUM 50 MG PO TABS
50.0000 mg | ORAL_TABLET | Freq: Two times a day (BID) | ORAL | 3 refills | Status: DC
Start: 2020-01-09 — End: 2020-01-28

## 2020-01-09 MED ORDER — LOSARTAN POTASSIUM 50 MG PO TABS
50.0000 mg | ORAL_TABLET | Freq: Two times a day (BID) | ORAL | 3 refills | Status: DC
Start: 2020-01-09 — End: 2020-01-09

## 2020-01-09 NOTE — Telephone Encounter (Signed)
Done. Refilled losartan to [facility]

## 2020-01-09 NOTE — Telephone Encounter (Signed)
Losartan prescription refilled to BID dosing.

## 2020-01-09 NOTE — Addendum Note (Signed)
Addended by: Amedeo Kinsman A on: 01/09/2020 12:35 PM   Modules accepted: Orders

## 2020-01-09 NOTE — Telephone Encounter (Signed)
Pt needs this sent to [facility] in [place] on [address]. Pt is out of medication

## 2020-01-09 NOTE — Telephone Encounter (Signed)
Pt needs a refill for a  90 day supply on losartan (COZAAR) 50 MG tablet twice daily  Pt stated that Kim increased the dosage

## 2020-01-09 NOTE — Addendum Note (Signed)
Addended by: Amedeo Kinsman A on: 01/09/2020 10:46 AM   Modules accepted: Orders

## 2020-01-13 ENCOUNTER — Ambulatory Visit: Payer: 59 | Admitting: Psychology

## 2020-01-21 ENCOUNTER — Ambulatory Visit: Payer: 59 | Admitting: Psychology

## 2020-01-24 ENCOUNTER — Other Ambulatory Visit: Payer: Self-pay

## 2020-01-24 ENCOUNTER — Telehealth: Payer: Self-pay | Admitting: Nurse Practitioner

## 2020-01-24 ENCOUNTER — Telehealth (INDEPENDENT_AMBULATORY_CARE_PROVIDER_SITE_OTHER): Payer: Managed Care, Other (non HMO) | Admitting: Nurse Practitioner

## 2020-01-24 ENCOUNTER — Encounter: Payer: Self-pay | Admitting: Nurse Practitioner

## 2020-01-24 ENCOUNTER — Ambulatory Visit: Payer: Managed Care, Other (non HMO) | Admitting: Pharmacist

## 2020-01-24 DIAGNOSIS — Z1211 Encounter for screening for malignant neoplasm of colon: Secondary | ICD-10-CM

## 2020-01-24 DIAGNOSIS — I10 Essential (primary) hypertension: Secondary | ICD-10-CM | POA: Diagnosis not present

## 2020-01-24 DIAGNOSIS — K649 Unspecified hemorrhoids: Secondary | ICD-10-CM

## 2020-01-24 DIAGNOSIS — E559 Vitamin D deficiency, unspecified: Secondary | ICD-10-CM

## 2020-01-24 DIAGNOSIS — R7303 Prediabetes: Secondary | ICD-10-CM | POA: Diagnosis not present

## 2020-01-24 MED ORDER — SAXENDA 18 MG/3ML ~~LOC~~ SOPN: PEN_INJECTOR | SUBCUTANEOUS | 2 refills | Status: DC

## 2020-01-24 NOTE — Patient Instructions (Addendum)
Visit Information  Goals Addressed              This Visit's Progress     Patient Stated   .  "I need help losing weight" (pt-stated)        CARE PLAN ENTRY (see longitudinal plan of care for additional care plan information)  Current Barriers:  . Social, financial, community barriers:  o In school for paralegal. Wanted to go into legal field, but knew that law school wasn't a possibility right now.  o 2 young children. Very busy balancing caring for them, work, and school.  o Highest 285 lbs; then lost weight and was down to 240 lbs, but then got pregnant and has not been able to get out of the 270-280s recently o Reports that she would like surgery to be a last resort.  o Most recent home weight: 275-280 lbs (changes based on bloating) . Obesity; complicated by chronic medical conditions including pre-diabetes, HTN, most recent BMI 50.3, previous BMI 49.2 (08/2019) . Current meal patterns: Patient used to meal plan, but has not been doing this recently.  o Breakfast: Slimfast shake takes with her to class. Or boiled egg/fruit.  o Lunch: often leftovers or heavy snacks; nuts, chips between class and work o Supper: sometimes able to [employer], sometimes picks up take out o Bedtime snacks: nuts (cashews, honey roasted pecans), low sodium kettle chips; kids snacks o Drinks: water, Therapist, music  . Current exercise:  o Before the semester started, she was working out 30-45 minutes daily. Elliptical (up to 60 minutes); sometimes would walk 3 miles within 1 hour.  . Current blood glucose readings: n/a . Current weight management pharmacotherapy: n/a o Hx Contrave. Phentermine-containing options are not appropriate given HTN . HTN: sees Dr. Garen Lah next week. HCTZ 25 mg daily, metoprolol XR 100 mg daily, losartan 50 mg BID. Marland Kitchen Depression: sertraline 50 mg daily  Pharmacist Clinical Goal(s):  Marland Kitchen Over the next 90 days, patient will work with PharmD and primary care provider to work towards  5-10% body weight loss  Interventions: . Comprehensive medication review performed, medication list updated in electronic medical record . Inter-disciplinary care team collaboration (see longitudinal plan of care) . Patient has not met goal of at least 5% of body weight loss with comprehensive lifestyle modifications alone in the past 3-6 months. Pharmacotherapy is appropriate to pursue as augmentation. Discussed Saxenda vs Wegovy. Given attainability/back order concerns w/ Wegovy, recommend starting Saxenda. Counseled on mechanism of action, side effects. Patient declines personal or family hx thyroid cancer. Patient amenable to therapy. Per discussion w/ PCP, start Saxenda 0.6 mg weekly. Titrate by 0.6 mg intervals weekly as tolerated to max daily dose of 3 mg. Completed PA. Will follow PA result.  . Discussed goal of re-implementing meal planning for at least 1 meal (lunch or supper) and to keep quick, low carb things on hand for breakfast (Slimfast shakes or protein bars). Discussed focusing on some sort of morning protein source to prevent her being really hungry going into lunch and choosing high carb snacks . If weight loss pharmacotherapy not covered by patient's insurance, recommend use of Victoza vs Ozempic.  Patient Self Care Activities:  . Patient will adhere to dietary modifications . Patient will target at least 150 minutes of moderate intensity exercise weekly . Patient will report any questions or concerns to provider   Initial goal documentation        Ms. Balgobin was given information about Chronic Care Management services  today including:  1. CCM service includes personalized support from designated clinical staff supervised by her physician, including individualized plan of care and coordination with other care providers 2. 24/7 contact phone numbers for assistance for urgent and routine care needs. 3. The patient may stop CCM services at any time (effective at the end of the  month) by phone call to the office staff.  Patient agreed to services and verbal consent obtained.   The patient verbalized understanding of instructions provided today and declined a print copy of patient instruction materials.    Plan:  - Scheduled f/u call in ~ 6 weeks  [name] Darnelle Maffucci, PharmD, [place], [place] Pharmacist [place] [phone]

## 2020-01-24 NOTE — Progress Notes (Signed)
Virtual Visit via Video Note  This visit type was conducted due to national recommendations for restrictions regarding the COVID-19 pandemic (e.g. social distancing).  This format is felt to be most appropriate for this patient at this time.  All issues noted in this document were discussed and addressed.  No physical exam was performed (except for noted visual exam findings with Video Visits).   I connected with@ on 01/26/20 at  9:00 AM EDT by a video enabled telemedicine application or telephone and verified that I am speaking with the correct person using two identifiers. Location patient: home Location provider: work or home office Persons participating in the virtual visit: patient, provider  I discussed the limitations, risks, security and privacy concerns of performing an evaluation and management service by telephone and the availability of in person appointments. I also discussed with the patient that there may be a patient responsible charge related to this service. The patient expressed understanding and agreed to proceed.  Reason for visit: Follow-up visit for hypertension.  HPI: This 44 year old patient has a history of hypertension, PVCs, allergic rhinitis, GERD,  prediabetes, morbid obesity, anxiety depression.  She was last seen on video visit 12/18/2019.  Since that visit, she had dental work done and tolerated that well.  Blood pressure was controlled adequately for the procedure.  She had no complications.    Essential hypertension: Not at goal: She is followed by Cardiology and sees Dr. Garen Lah next week.  She is maintained on losartan 50 mg twice daily, HCTZ 25 mg daily, metoprolol succinate 100 mg daily.  She reports blood pressure at home runs 130s over 80s, but does not have a written record to report today.  She had a blood pressure initially with rushing today at 158/95.  She repeated it again after 15 minutes of rest at 141/88 heart rate 65. She is tolerating the  medication well and has noted no side effects.   BMI 50/morbid obesity/prediabetes: Weight is continuing to be an issue.  She did well for a month, but cannot sustain it.  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  Patient is requesting referral to Weight Loss Management.  Wt Readings from Last 3 Encounters:  01/24/20 275 lb (124.7 kg)  12/18/19 275 lb (124.7 kg)  11/20/19 276 lb (125.2 kg)   Lab Results  Component Value Date   HGBA1C 5.8 (H) 11/20/2019   Lab Results  Component Value Date   CHOL 137 01/08/2020   HDL 54 01/08/2020   LDLCALC 67 01/08/2020   TRIG 80 01/08/2020   CHOLHDL 2.5 01/08/2020    History of mild constipation, been taking Benefiber every day but has been having bloating with vegetables.  She has external hemorrhoid tags that she would like removed.  No bleeding or pain.    Immunizations: Tdap and Pfizer Covid done.  Needs flu shot.  Diet:Trying- needs help Exercise: No regular exercise Colonoscopy:No history of colon cancer screening.  Family history negative for colon cancer or colon polyps. Pap Smear: GYN appt in Oct w Dr. Marcelline Mates Mammogram: Completed 12/11/2019: Normal and repeat in 1 year.  Dental: Up-to-date   ROS: See pertinent positives and negatives per HPI.  Past Medical History:  Diagnosis Date  . Abscess of axilla, left 05/01/2013  . Anxiety   . Boil, breast 10/22/2019  . Bronchitis    chronic - has flare every December  . Cervical polyp    hx of  .  Chest pain 05/01/2013  . Delivered by cesarean section 06/05/2016  . Depression, major, single episode, moderate ([place]) 04/19/2018  . Environmental allergies   . Genital warts   . GERD (gastroesophageal reflux disease)    Per MD chart note 12/01/2010  . Heart murmur   . Hx of abnormal Pap smear 2012  . Hypertension   . Morbid obesity ([facility])    BMI 47.9  . Persistent headaches    worst during menstrual cycle  . PVC (premature ventricular  contraction) 02/20/2015  . STD (sexually transmitted disease)    hx genital warts/?HSV  . Vaginal bleeding 07/15/2018  . Vaginal Pap smear, abnormal     Past Surgical History:  Procedure Laterality Date  . CESAREAN SECTION N/A 06/05/2016   Procedure: CESAREAN SECTION;  Surgeon: Waymon Amato, MD;  Location: [facility];  Service: Obstetrics;  Laterality: N/A;  . CESAREAN SECTION N/A 09/20/2017   Procedure: CESAREAN SECTION;  Surgeon: Christophe Louis, MD;  Location: [facility];  Service: Obstetrics;  Laterality: N/A;  EDD 10/02/17  . COLPOSCOPY  2012   no treatment to cervix--pap smears reverted to normal  . WISDOM TOOTH EXTRACTION      Family History  Problem Relation Age of Onset  . Diabetes Mother   . Hypertension Mother        Under control  . Diabetes Maternal Aunt   . Seizures Maternal Aunt   . Diabetes Maternal Grandmother   . Hypertension Maternal Grandmother   . Alcohol abuse Father   . Diabetes Father   . Hypertension Sister   . Diabetes Maternal Uncle   . Hypertension Other   . Obesity Other   . Heart attack Paternal Grandmother   . COPD Neg Hx     SOCIAL HX:  NO tobacco use   Current Outpatient Medications:  .  acetaminophen (TYLENOL) 500 MG tablet, Take 500-1,000 mg by mouth daily as needed for headache. , Disp: , Rfl:  .  calcium carbonate (TUMS - DOSED IN MG ELEMENTAL CALCIUM) 500 MG chewable tablet, Chew 3-4 tablets by mouth daily as needed for indigestion or heartburn., Disp: , Rfl:  .  cetirizine (ZYRTEC) 10 MG tablet, Take 1 tablet (10 mg total) by mouth daily., Disp: 90 tablet, Rfl: 3 .  GARLIC PO, Take 1 capsule by mouth daily., Disp: , Rfl:  .  hydrochlorothiazide (HYDRODIURIL) 25 MG tablet, TAKE 1 TABLET(25 MG) BY MOUTH DAILY, Disp: 90 tablet, Rfl: 0 .  losartan (COZAAR) 50 MG tablet, Take 1 tablet (50 mg total) by mouth 2 (two) times daily., Disp: 90 tablet, Rfl: 3 .  metoprolol succinate (TOPROL-XL) 100 MG 24 hr tablet, Take 1 tablet (100 mg  total) by mouth daily. Take with or immediately following a meal., Disp: 90 tablet, Rfl: 1 .  omeprazole (PRILOSEC) 20 MG capsule, Take 1 capsule (20 mg total) by mouth daily., Disp: 30 capsule, Rfl: 5 .  Prenatal Vit-Fe Fumarate-FA (MULTIVITAMIN-PRENATAL) 27-0.8 MG TABS tablet, Take 1 tablet by mouth daily. , Disp: , Rfl:  .  sertraline (ZOLOFT) 50 MG tablet, Take 1 tablet (50 mg total) by mouth daily. Take 1/2 tab daily for 1 week and then go to 1 tab daily if tolerated., Disp: 90 tablet, Rfl: 3 .  triamcinolone (NASACORT AQ) 55 MCG/ACT AERO nasal inhaler, Place 2 sprays into the nose daily. (Patient taking differently: Place 1 spray into the nose daily. ), Disp: 1 Inhaler, Rfl: 12 .  triamcinolone cream (KENALOG) 0.1 %, Apply 1 application  topically 2 (two) times daily as needed., Disp: 30 g, Rfl: 0 .  Liraglutide -Weight Management (SAXENDA) 18 MG/3ML SOPN, Inject 0.6 mg daily. Titrate as instructed. Max daily dose 3 mg, Disp: 15 mL, Rfl: 2  EXAM:  VITALS per patient if applicable:  GENERAL: alert, oriented, appears well and in no acute distress  HEENT: atraumatic, conjunctiva clear, no obvious abnormalities on inspection of external nose and ears  NECK: normal movements of the head and neck  LUNGS: on inspection no signs of respiratory distress, breathing rate appears normal, no obvious gross SOB, gasping or wheezing  CV: no obvious cyanosis  MS: moves all visible extremities without noticeable abnormality  PSYCH/NEURO: pleasant and cooperative, no obvious depression or anxiety, speech and thought processing grossly intact  ASSESSMENT AND PLAN:  Discussed the following assessment and plan:  Morbid obesity ([place]) - Plan: Amb Ref to Medical Weight Management  Essential hypertension  Pre-diabetes  Vitamin D deficiency  Colon cancer screening - Plan: Ambulatory referral to Gastroenterology  No problem-specific Assessment & Plan notes found for this encounter.  Morbid  obesity/prediabetes: Patient has not met her goal of at least 5% body weight loss with comprehensive lifestyle management over the last 3 months.  She is requesting a referral to medical weight loss team to help with healthy eating, and weight loss. Recommendation by [name] in CCM  to start Saxenda.  No personal or family history of thyroid cancer.  She will begin Saxenda 0.6 mg weekly as tolerated to maximum dose of 3 mg.  [name] completed the PA.  Reviewed healthy diet, and recommended exercise again.  Follow-up with CMM in 6 weeks.  Referral placed to GI for screening colonoscopy, and she would eventually like to have referral to have her external hemorrhoid tags removed.   I discussed the assessment and treatment plan with the patient. The patient was provided an opportunity to ask questions and all were answered. The patient agreed with the plan and demonstrated an understanding of the instructions.   The patient was advised to call back or seek an in-person evaluation if the symptoms worsen or if the condition fails to improve as anticipated.  Denice Paradise, NP Adult Nurse Practitioner [place] [phone]

## 2020-01-24 NOTE — Telephone Encounter (Signed)
[facility] is calling regarding prior authorization for Saxenda.

## 2020-01-24 NOTE — Chronic Care Management (AMB) (Signed)
Chronic Care Management   Follow Up Note   01/24/2020 Name: Erica Ramos MRN: [id number] DOB: 03-Dec-1975  Referred by: Marval Regal, NP Reason for referral : Chronic Care Management (Medication Management)   Erica Ramos is a 44 y.o. year old female who is a primary care patient of Marval Regal, NP. The CCM team was consulted for assistance with chronic disease management and care coordination needs.    Contacted patient telephonically for medication management review.  Review of patient status, including review of consultants reports, relevant laboratory and other test results, and collaboration with appropriate care team members and the patient's provider was performed as part of comprehensive patient evaluation and provision of chronic care management services.    SDOH (Social Determinants of Health) assessments performed: Yes See Care Plan activities for detailed interventions related to SDOH)  SDOH Interventions     Most Recent Value  SDOH Interventions  Financial Strain Interventions Intervention Not Indicated  Physical Activity Interventions Other (Comments)  [discussed incorporating more exercise]       Outpatient Encounter Medications as of 01/24/2020  Medication Sig  . acetaminophen (TYLENOL) 500 MG tablet Take 500-1,000 mg by mouth daily as needed for headache.   . calcium carbonate (TUMS - DOSED IN MG ELEMENTAL CALCIUM) 500 MG chewable tablet Chew 3-4 tablets by mouth daily as needed for indigestion or heartburn.  . cetirizine (ZYRTEC) 10 MG tablet Take 1 tablet (10 mg total) by mouth daily.  Marland Kitchen GARLIC PO Take 1 capsule by mouth daily.  . hydrochlorothiazide (HYDRODIURIL) 25 MG tablet TAKE 1 TABLET(25 MG) BY MOUTH DAILY  . Liraglutide -Weight Management (SAXENDA) 18 MG/3ML SOPN Inject 0.6 mg daily. Titrate as instructed. Max daily dose 3 mg  . losartan (COZAAR) 50 MG tablet Take 1 tablet (50 mg total) by mouth 2 (two) times daily.  . metoprolol succinate  (TOPROL-XL) 100 MG 24 hr tablet Take 1 tablet (100 mg total) by mouth daily. Take with or immediately following a meal.  . omeprazole (PRILOSEC) 20 MG capsule Take 1 capsule (20 mg total) by mouth daily.  . Prenatal Vit-Fe Fumarate-FA (MULTIVITAMIN-PRENATAL) 27-0.8 MG TABS tablet Take 1 tablet by mouth daily.   . sertraline (ZOLOFT) 50 MG tablet Take 1 tablet (50 mg total) by mouth daily. Take 1/2 tab daily for 1 week and then go to 1 tab daily if tolerated.  . triamcinolone (NASACORT AQ) 55 MCG/ACT AERO nasal inhaler Place 2 sprays into the nose daily. (Patient taking differently: Place 1 spray into the nose daily. )  . triamcinolone cream (KENALOG) 0.1 % Apply 1 application topically 2 (two) times daily as needed.   No facility-administered encounter medications on file as of 01/24/2020.     Objective:   Goals Addressed              This Visit's Progress     Patient Stated   .  "I need help losing weight" (pt-stated)        CARE PLAN ENTRY (see longitudinal plan of care for additional care plan information)  Current Barriers:  . Social, financial, community barriers:  o In school for paralegal. Wanted to go into legal field, but knew that law school wasn't a possibility right now.  o 2 young children. Very busy balancing caring for them, work, and school.  o Highest 285 lbs; then lost weight and was down to 240 lbs, but then got pregnant and has not been able to get out of the 270-280s  recently o Reports that she would like surgery to be a last resort.  o Most recent home weight: 275-280 lbs (changes based on bloating) . Obesity; complicated by chronic medical conditions including pre-diabetes, HTN, most recent BMI 50.3, previous BMI 49.2 (08/2019) . Current meal patterns: Patient used to meal plan, but has not been doing this recently.  o Breakfast: Slimfast shake takes with her to class. Or boiled egg/fruit.  o Lunch: often leftovers or heavy snacks; nuts, chips between class and  work o Supper: sometimes able to [employer], sometimes picks up take out o Bedtime snacks: nuts (cashews, honey roasted pecans), low sodium kettle chips; kids snacks o Drinks: water, Therapist, music  . Current exercise:  o Before the semester started, she was working out 30-45 minutes daily. Elliptical (up to 60 minutes); sometimes would walk 3 miles within 1 hour.  . Current blood glucose readings: n/a . Current weight management pharmacotherapy: n/a o Hx Contrave. Phentermine-containing options are not appropriate given HTN . HTN: sees Dr. Garen Lah next week. HCTZ 25 mg daily, metoprolol XR 100 mg daily, losartan 50 mg BID. Marland Kitchen Depression: sertraline 50 mg daily  Pharmacist Clinical Goal(s):  Marland Kitchen Over the next 90 days, patient will work with PharmD and primary care provider to work towards 5-10% body weight loss  Interventions: . Comprehensive medication review performed, medication list updated in electronic medical record . Inter-disciplinary care team collaboration (see longitudinal plan of care) . Patient has not met goal of at least 5% of body weight loss with comprehensive lifestyle modifications alone in the past 3-6 months. Pharmacotherapy is appropriate to pursue as augmentation. Discussed Saxenda vs Wegovy. Given attainability/back order concerns w/ Wegovy, recommend starting Saxenda. Counseled on mechanism of action, side effects. Patient declines personal or family hx thyroid cancer. Patient amenable to therapy. Per discussion w/ PCP, start Saxenda 0.6 mg weekly. Titrate by 0.6 mg intervals weekly as tolerated to max daily dose of 3 mg. Completed PA. Will follow PA result.  . Discussed goal of re-implementing meal planning for at least 1 meal (lunch or supper) and to keep quick, low carb things on hand for breakfast (Slimfast shakes or protein bars). Discussed focusing on some sort of morning protein source to prevent her being really hungry going into lunch and choosing high carb  snacks . If weight loss pharmacotherapy not covered by patient's insurance, recommend use of Victoza vs Ozempic.  Patient Self Care Activities:  . Patient will adhere to dietary modifications . Patient will target at least 150 minutes of moderate intensity exercise weekly . Patient will report any questions or concerns to provider   Initial goal documentation         Plan:  - Scheduled f/u call in ~ 6 weeks  [name] Darnelle Maffucci, PharmD, [place], [place] Pharmacist [place] [phone]

## 2020-01-26 ENCOUNTER — Encounter: Payer: Self-pay | Admitting: Nurse Practitioner

## 2020-01-26 NOTE — Telephone Encounter (Signed)
1.  I believe [name] ws handling the PA on Saxenda.   2. Also, she is not eligible for cc screening until age 44. If she is not having any GI complaints-off Benefiber-  we can CXL the GI referral.  3. If she wants to have her hemorrhoids removed- I can place general  surgery referral with Dr. Lemar Livings .

## 2020-01-26 NOTE — Patient Instructions (Addendum)
Referral placed to the medical to the [facility] Medical Weight Loss program.  She had visit with [name] in [facility] and recommendation to begin Saxenda.  Orders placed and 6 week  follow-up with [name] in place. Advised to continue with meal planning and restart exercise.    Referral placed to call GI for screening colonoscopy.  Follow-up with cardiology as arranged.  Hypertension is much improved, but she is still reporting readings of 141/88.  Patient advised to check her blood pressures daily and record and bring in for her visit with Cardiology.  Follow-up office visit in 3 months.

## 2020-01-27 ENCOUNTER — Ambulatory Visit: Payer: 59 | Admitting: Psychology

## 2020-01-27 ENCOUNTER — Ambulatory Visit: Payer: Managed Care, Other (non HMO) | Admitting: Cardiology

## 2020-01-28 ENCOUNTER — Other Ambulatory Visit: Payer: Self-pay

## 2020-01-28 ENCOUNTER — Encounter: Payer: Self-pay | Admitting: Cardiology

## 2020-01-28 ENCOUNTER — Ambulatory Visit: Payer: Managed Care, Other (non HMO) | Admitting: Pharmacist

## 2020-01-28 ENCOUNTER — Ambulatory Visit (INDEPENDENT_AMBULATORY_CARE_PROVIDER_SITE_OTHER): Payer: Managed Care, Other (non HMO) | Admitting: Cardiology

## 2020-01-28 VITALS — BP 162/94 | HR 59 | Ht 63.0 in | Wt 275.5 lb

## 2020-01-28 DIAGNOSIS — I10 Essential (primary) hypertension: Secondary | ICD-10-CM | POA: Diagnosis not present

## 2020-01-28 DIAGNOSIS — R7303 Prediabetes: Secondary | ICD-10-CM

## 2020-01-28 MED ORDER — LOSARTAN POTASSIUM 100 MG PO TABS
100.0000 mg | ORAL_TABLET | Freq: Every day | ORAL | 5 refills | Status: DC
Start: 2020-01-28 — End: 2020-08-06

## 2020-01-28 MED ORDER — CARVEDILOL 25 MG PO TABS: 25.0000 mg | ORAL_TABLET | Freq: Two times a day (BID) | ORAL | 5 refills | Status: DC

## 2020-01-28 NOTE — Chronic Care Management (AMB) (Signed)
Chronic Care Management   Follow Up Note   01/28/2020 Name: Erica Ramos MRN: [id number] DOB: 12-24-1975  Referred by: Theadore Nan, NP Reason for referral : Chronic Care Management (Medication Management)   Erica Ramos is a 44 y.o. year old female who is a primary care patient of Theadore Nan, NP. The CCM team was consulted for assistance with chronic disease management and care coordination needs.    Received call from patient for medication access f/u  Review of patient status, including review of consultants reports, relevant laboratory and other test results, and collaboration with appropriate care team members and the patient's provider was performed as part of comprehensive patient evaluation and provision of chronic care management services.    SDOH (Social Determinants of Health) assessments performed: No See Care Plan activities for detailed interventions related to [facility])     Outpatient Encounter Medications as of 01/28/2020  Medication Sig   acetaminophen (TYLENOL) 500 MG tablet Take 500-1,000 mg by mouth daily as needed for headache.    calcium carbonate (TUMS - DOSED IN MG ELEMENTAL CALCIUM) 500 MG chewable tablet Chew 3-4 tablets by mouth daily as needed for indigestion or heartburn.   carvedilol (COREG) 25 MG tablet Take 1 tablet (25 mg total) by mouth 2 (two) times daily.   cetirizine (ZYRTEC) 10 MG tablet Take 1 tablet (10 mg total) by mouth daily.   GARLIC PO Take 1 capsule by mouth daily.   hydrochlorothiazide (HYDRODIURIL) 25 MG tablet TAKE 1 TABLET(25 MG) BY MOUTH DAILY   Liraglutide -Weight Management (SAXENDA) 18 MG/3ML SOPN Inject 0.6 mg daily. Titrate as instructed. Max daily dose 3 mg (Patient not taking: Reported on 01/28/2020)   losartan (COZAAR) 100 MG tablet Take 1 tablet (100 mg total) by mouth daily.   omeprazole (PRILOSEC) 20 MG capsule Take 1 capsule (20 mg total) by mouth daily. (Patient taking differently: Take 20 mg by mouth  as needed. )   Prenatal Vit-Fe Fumarate-FA (MULTIVITAMIN-PRENATAL) 27-0.8 MG TABS tablet Take 1 tablet by mouth daily.    sertraline (ZOLOFT) 50 MG tablet Take 1 tablet (50 mg total) by mouth daily. Take 1/2 tab daily for 1 week and then go to 1 tab daily if tolerated.   triamcinolone (NASACORT AQ) 55 MCG/ACT AERO nasal inhaler Place 2 sprays into the nose daily. (Patient taking differently: Place 1 spray into the nose daily. )   triamcinolone cream (KENALOG) 0.1 % Apply 1 application topically 2 (two) times daily as needed.   No facility-administered encounter medications on file as of 01/28/2020.     Objective:   Goals Addressed              This Visit's Progress     Patient Stated     "I need help losing weight" (pt-stated)        CARE PLAN ENTRY (see longitudinal plan of care for additional care plan information)  Current Barriers:   Social, financial, community barriers:  o In school for paralegal. 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 Very busy balancing caring for them, work, and school.  o Most recent home weight: 275-280 lbs (changes based on bloating)  Obesity; complicated by chronic medical conditions including pre-diabetes, HTN, most recent BMI 50.3, previous BMI 49.2 (08/2019)  Current meal patterns: Patient used to meal plan, but has not been doing this recently.  o Breakfast: Slimfast shake takes with her to class. Or boiled egg/fruit.  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 Elliptical (up to 60 minutes); sometimes would walk 3 miles within 1 hour.   Current blood glucose readings: n/a  Current weight management pharmacotherapy: n/a. Decided to pursue GLP1  coverage. PA submitted to insurance for Saxenda. PA denied.  o Hx Contrave. Phentermine-containing options are not appropriate given HTN  HTN: sees Dr. Azucena Cecil next week. HCTZ 25 mg daily, metoprolol XR 100 mg daily, losartan 50 mg BID.  Depression: sertraline 50 mg daily  Pharmacist Clinical Goal(s):   Over the next 90 days, patient will work with PharmD and primary care provider to work towards 5-10% body weight loss  Interventions:  PA denial notes that for medication coverage, patient must be using Saxenda for weight loss and must be engaged in concurrent lifestyle modification. Printed my note that demonstrates this information, faxed appeal into [employer]. Will await determination. Will f/u in ~1 week if I have not heard back.  Patient Self Care Activities:   Patient will adhere to dietary modifications  Patient will target at least 150 minutes of moderate intensity exercise weekly  Patient will report any questions or concerns to provider   Please see past updates related to this goal by clicking on the "Past Updates" button in the selected goal          Plan:  - Will f/u with [employer] by next week regarding result of appeal  [name] Feliz Beam, PharmD, [place], CPP Clinical Pharmacist [facility] [employer] [phone]

## 2020-01-28 NOTE — Progress Notes (Signed)
Cardiology Office Note:    Date:  01/28/2020   ID:  BRIT CARBONELL, DOB [birth year], MRN [id number]  PCP:  Theadore Nan, NP  [facility] Cardiologist:  Debbe Odea, MD  [facility] Electrophysiologist:  None   Referring MD: Theadore Nan, NP   Chief Complaint  Patient presents with  . OTHER    HTN pt would like to discuss BP medications. Meds reviewed verbally with pt.   Erica Ramos is a 44 y.o. female who is being seen today for the evaluation of hypertension at the request of Theadore Nan, NP.   History of Present Illness:    Erica Ramos is a 44 y.o. female with a hx of hypertension, morbid obesity who presents due to elevated blood pressures.  Patient states being diagnosed with hypertension for over 10 years now.  She takes all her medications as prescribed, blood pressures when she checks typically on her risk are in the 140s to 150s systolic.  Denies any symptoms of chest pain or shortness of breath.  She states trying to lose weight which has been hard.  She endorses not eating well and her diet contains a fair amount of salt.  She sometimes binge eats when she is stressed and occasionally eats very late at night/early in the morning as 2 AM.  Past Medical History:  Diagnosis Date  . Abscess of axilla, left 05/01/2013  . Anxiety   . Boil, breast 10/22/2019  . Bronchitis    chronic - has flare every December  . Cervical polyp    hx of  . Chest pain 05/01/2013  . Delivered by cesarean section 06/05/2016  . Depression, major, single episode, moderate (HCC) 04/19/2018  . Environmental allergies   . Genital warts   . GERD (gastroesophageal reflux disease)    Per MD chart note 12/01/2010  . Heart murmur   . Hx of abnormal Pap smear 2012  . Hypertension   . Morbid obesity (HCC)    BMI 47.9  . Persistent headaches    worst during menstrual cycle  . PVC (premature ventricular contraction) 02/20/2015  . STD (sexually transmitted disease)     hx genital warts/?HSV  . Vaginal bleeding 07/15/2018  . Vaginal Pap smear, abnormal     Past Surgical History:  Procedure Laterality Date  . CESAREAN SECTION N/A 06/05/2016   Procedure: CESAREAN SECTION;  Surgeon: Hoover Browns, MD;  Location: [facility];  Service: Obstetrics;  Laterality: N/A;  . CESAREAN SECTION N/A 09/20/2017   Procedure: CESAREAN SECTION;  Surgeon: Gerald Leitz, MD;  Location: [facility];  Service: Obstetrics;  Laterality: N/A;  EDD 10/02/17  . COLPOSCOPY  2012   no treatment to cervix--pap smears reverted to normal  . WISDOM TOOTH EXTRACTION      Current Medications: Current Meds  Medication Sig  . acetaminophen (TYLENOL) 500 MG tablet Take 500-1,000 mg by mouth daily as needed for headache.   . calcium carbonate (TUMS - DOSED IN MG ELEMENTAL CALCIUM) 500 MG chewable tablet Chew 3-4 tablets by mouth daily as needed for indigestion or heartburn.  . cetirizine (ZYRTEC) 10 MG tablet Take 1 tablet (10 mg total) by mouth daily.  Marland Kitchen GARLIC PO Take 1 capsule by mouth daily.  . hydrochlorothiazide (HYDRODIURIL) 25 MG tablet TAKE 1 TABLET(25 MG) BY MOUTH DAILY  . losartan (COZAAR) 100 MG tablet Take 1 tablet (100 mg total) by mouth daily.  Marland Kitchen omeprazole (PRILOSEC) 20 MG capsule Take 1 capsule (20  mg total) by mouth daily. (Patient taking differently: Take 20 mg by mouth as needed. )  . Prenatal Vit-Fe Fumarate-FA (MULTIVITAMIN-PRENATAL) 27-0.8 MG TABS tablet Take 1 tablet by mouth daily.   . sertraline (ZOLOFT) 50 MG tablet Take 1 tablet (50 mg total) by mouth daily. Take 1/2 tab daily for 1 week and then go to 1 tab daily if tolerated.  . triamcinolone (NASACORT AQ) 55 MCG/ACT AERO nasal inhaler Place 2 sprays into the nose daily. (Patient taking differently: Place 1 spray into the nose daily. )  . triamcinolone cream (KENALOG) 0.1 % Apply 1 application topically 2 (two) times daily as needed.  . [DISCONTINUED] losartan (COZAAR) 50 MG tablet Take 1 tablet (50 mg total)  by mouth 2 (two) times daily.  . [DISCONTINUED] metoprolol succinate (TOPROL-XL) 100 MG 24 hr tablet Take 1 tablet (100 mg total) by mouth daily. Take with or immediately following a meal.     Allergies:   Neosporin [neomycin-bacitracin zn-polymyx], Penicillins, and Strawberry flavor   Social History   Socioeconomic History  . Marital status: Single    Spouse name: Not on file  . Number of children: 0  . Years of education: Not on file  . Highest education level: Not on file  Occupational History    Employer: [employer]  Tobacco Use  . Smoking status: Never Smoker  . Smokeless tobacco: Never Used  Substance and Sexual Activity  . Alcohol use: Yes    Alcohol/week: 1.0 standard drink    Types: 1 Standard drinks or equivalent per week    Comment: Occasionally; less than 1-2 a week  . Drug use: No  . Sexual activity: Yes    Partners: Male    Birth control/protection: Condom    Comment: condoms sometimes  Other Topics Concern  . Not on file  Social History Narrative   Exercise--  2-3 x a week for about 30 min- 60 min   Social Determinants of Health   Financial Resource Strain: Low Risk   . Difficulty of Paying Living Expenses: Not hard at all  Food Insecurity:   . Worried About Programme researcher, broadcasting/film/videounning Out of Food in the Last Year: Not on file  . Ran Out of Food in the Last Year: Not on file  Transportation Needs:   . Lack of Transportation (Medical): Not on file  . Lack of Transportation (Non-Medical): Not on file  Physical Activity: Insufficiently Active  . Days of Exercise per Week: 2 days  . Minutes of Exercise per Session: 30 min  Stress:   . Feeling of Stress : Not on file  Social Connections:   . Frequency of Communication with Friends and Family: Not on file  . Frequency of Social Gatherings with Friends and Family: Not on file  . Attends Religious Services: Not on file  . Active Member of Clubs or Organizations: Not on file  . Attends BankerClub or Organization Meetings:  Not on file  . Marital Status: Not on file     Family History: The patient's family history includes Alcohol abuse in her father; Diabetes in her father, maternal aunt, maternal grandmother, maternal uncle, and mother; Heart Problems in her maternal grandmother and paternal grandmother; Heart attack in her paternal grandmother; Hypertension in her maternal grandmother, mother, sister, and another family member; Obesity in an other family member; Seizures in her maternal aunt. There is no history of COPD.  ROS:   Please see the history of present illness.     All other systems  reviewed and are negative.  EKGs/Labs/Other Studies Reviewed:    The following studies were reviewed today:   EKG:  EKG is  ordered today.  The ekg ordered today demonstrates sinus bradycardia, heart rate 59  Recent Labs: 08/09/2019: TSH 2.060 11/20/2019: ALT 18; Hemoglobin 12.7; Platelets 310 01/08/2020: BUN 13; Creatinine, Ser 0.80; Potassium 4.3; Sodium 140  Recent Lipid Panel    Component Value Date/Time   CHOL 137 01/08/2020 0939   TRIG 80 01/08/2020 0939   HDL 54 01/08/2020 0939   CHOLHDL 2.5 01/08/2020 0939   CHOLHDL 3 03/15/2019 1135   VLDL 17.4 03/15/2019 1135   LDLCALC 67 01/08/2020 0939    Physical Exam:    VS:  BP (!) 162/94 (BP Location: Right Arm, Patient Position: Sitting, Cuff Size: Large)   Pulse (!) 59   Ht  (1.6 m)   Wt 275 lb 8 oz (125 kg)   SpO2 98%   BMI 48.80 kg/m     Wt Readings from Last 3 Encounters:  01/28/20 275 lb 8 oz (125 kg)  01/24/20 275 lb (124.7 kg)  12/18/19 275 lb (124.7 kg)     GEN:  Well nourished, well developed in no acute distress HEENT: Normal NECK: No JVD; No carotid bruits LYMPHATICS: No lymphadenopathy CARDIAC: RRR, no murmurs, rubs, gallops RESPIRATORY:  Clear to auscultation without rales, wheezing or rhonchi  ABDOMEN: Soft, non-tender, distended MUSCULOSKELETAL:  No edema; No deformity  SKIN: Warm and dry NEUROLOGIC:  Alert and oriented x  3 PSYCHIATRIC:  Normal affect   ASSESSMENT:    1. Essential hypertension   2. Morbid obesity (HCC)    PLAN:    In order of problems listed above:  1. Patient with history of hypertension, blood pressure still uncontrolled.  She was educated on low-salt diet, DASH diet educational information provided for patient.  Consolidate losartan to 100 mg daily.  Stop Toprol-XL, start Coreg 25 mg twice daily.  Continue HCTZ 25 mg daily.  If at follow-up visit, blood pressure still elevated, will start Norvasc. 2. Patient is morbidly obese, low calorie diet, weight loss advised.  Encouraged to keep appointment with nutritionist regarding weight loss.  Follow-up in 1 month.   Medication Adjustments/Labs and Tests Ordered: Current medicines are reviewed at length with the patient today.  Concerns regarding medicines are outlined above.  Orders Placed This Encounter  Procedures  . EKG 12-Lead   Meds ordered this encounter  Medications  . losartan (COZAAR) 100 MG tablet    Sig: Take 1 tablet (100 mg total) by mouth daily.    Dispense:  30 tablet    Refill:  5  . carvedilol (COREG) 25 MG tablet    Sig: Take 1 tablet (25 mg total) by mouth 2 (two) times daily.    Dispense:  60 tablet    Refill:  5    Patient Instructions  Medication Instructions:  Your physician has recommended you make the following change in your medication:   1)  STOP taking you metoprolol succinate (TOPROL-XL). 2)  Change the way you take losartan (COZAAR) 100 MG tablet: Take 1 tablet (100 mg total) by mouth daily. 3)  START taking carvedilol (COREG) 25 MG tablet: Take 1 tablet (25 mg total) by mouth 2 (two) times daily.  *If you need a refill on your cardiac medications before your next appointment, please call your pharmacy*   Lab Work: None Ordered If you have labs (blood work) drawn today and your tests are completely normal,  you will receive your results only by: Marland Kitchen MyChart Message (if you have MyChart)  OR . A paper copy in the mail If you have any lab test that is abnormal or we need to change your treatment, we will call you to review the results.   Testing/Procedures: None Ordered   Follow-Up: At [facility], you and your health needs are our priority.  As part of our continuing mission to provide you with exceptional heart care, we have created designated Provider Care Teams.  These Care Teams include your primary Cardiologist (physician) and Advanced Practice Providers (APPs -  Physician Assistants and Nurse Practitioners) who all work together to provide you with the care you need, when you need it.  We recommend signing up for the patient portal called "MyChart".  Sign up information is provided on this After Visit Summary.  MyChart is used to connect with patients for Virtual Visits (Telemedicine).  Patients are able to view lab/test results, encounter notes, upcoming appointments, etc.  Non-urgent messages can be sent to your provider as well.   To learn more about what you can do with MyChart, go to [email or web address].    Your next appointment:   1 month(s)  The format for your next appointment:   In Person  Provider:   Debbe Odea, MD   Other Instructions   DASH Eating Plan DASH stands for "Dietary Approaches to Stop Hypertension." The DASH eating plan is a healthy eating plan that has been shown to reduce high blood pressure (hypertension). It may also reduce your risk for type 2 diabetes, heart disease, and stroke. The DASH eating plan may also help with weight loss. What are tips for following this plan?  General guidelines  Avoid eating more than 2,300 mg (milligrams) of salt (sodium) a day. If you have hypertension, you may need to reduce your sodium intake to 1,500 mg a day.  Limit alcohol intake to no more than 1 drink a day for nonpregnant women and 2 drinks a day for men. One drink equals 12 oz of beer, 5 oz of wine, or 1 oz of hard  liquor.  Work with your health care provider to maintain a healthy body weight or to lose weight. Ask what an ideal weight is for you.  Get at least 30 minutes of exercise that causes your heart to beat faster (aerobic exercise) most days of the week. Activities may include walking, swimming, or biking.  Work with your health care provider or diet and nutrition specialist (dietitian) to adjust your eating plan to your individual calorie needs. Reading food labels   Check food labels for the amount of sodium per serving. Choose foods with less than 5 percent of the Daily Value of sodium. Generally, foods with less than 300 mg of sodium per serving fit into this eating plan.  To find whole grains, look for the word "whole" as the first word in the ingredient list. Shopping  Buy products labeled as "low-sodium" or "no salt added."  Buy fresh foods. Avoid canned foods and premade or frozen meals. Cooking  Avoid adding salt when cooking. Use salt-free seasonings or herbs instead of table salt or sea salt. Check with your health care provider or pharmacist before using salt substitutes.  Do not fry foods. Cook foods using healthy methods such as baking, boiling, grilling, and broiling instead.  Cook with heart-healthy oils, such as olive, canola, soybean, or sunflower oil. Meal planning  Eat a balanced diet that includes: ? 5  or more servings of fruits and vegetables each day. At each meal, try to fill half of your plate with fruits and vegetables. ? Up to 6-8 servings of whole grains each day. ? Less than 6 oz of lean meat, poultry, or fish each day. A 3-oz serving of meat is about the same size as a deck of cards. One egg equals 1 oz. ? 2 servings of low-fat dairy each day. ? A serving of nuts, seeds, or beans 5 times each week. ? Heart-healthy fats. Healthy fats called Omega-3 fatty acids are found in foods such as flaxseeds and coldwater fish, like sardines, salmon, and  mackerel.  Limit how much you eat of the following: ? Canned or prepackaged foods. ? Food that is high in trans fat, such as fried foods. ? Food that is high in saturated fat, such as fatty meat. ? Sweets, desserts, sugary drinks, and other foods with added sugar. ? Full-fat dairy products.  Do not salt foods before eating.  Try to eat at least 2 vegetarian meals each week.  Eat more home-cooked food and less restaurant, buffet, and fast food.  When eating at a restaurant, ask that your food be prepared with less salt or no salt, if possible. What foods are recommended? The items listed may not be a complete list. Talk with your dietitian about what dietary choices are best for you. Grains Whole-grain or whole-wheat bread. Whole-grain or whole-wheat pasta. Brown rice. Orpah Cobb. Bulgur. Whole-grain and low-sodium cereals. Pita bread. Low-fat, low-sodium crackers. Whole-wheat flour tortillas. Vegetables Fresh or frozen vegetables (raw, steamed, roasted, or grilled). Low-sodium or reduced-sodium tomato and vegetable juice. Low-sodium or reduced-sodium tomato sauce and tomato paste. Low-sodium or reduced-sodium canned vegetables. Fruits All fresh, dried, or frozen fruit. Canned fruit in natural juice (without added sugar). Meat and other protein foods Skinless chicken or [place]. Ground chicken or [place]. Pork with fat trimmed off. Fish and seafood. Egg whites. Dried beans, peas, or lentils. Unsalted nuts, nut butters, and seeds. Unsalted canned beans. Lean cuts of beef with fat trimmed off. Low-sodium, lean deli meat. Dairy Low-fat (1%) or fat-free (skim) milk. Fat-free, low-fat, or reduced-fat cheeses. Nonfat, low-sodium ricotta or cottage cheese. Low-fat or nonfat yogurt. Low-fat, low-sodium cheese. Fats and oils Soft margarine without trans fats. Vegetable oil. Low-fat, reduced-fat, or light mayonnaise and salad dressings (reduced-sodium). Canola, safflower, olive, soybean, and  sunflower oils. Avocado. Seasoning and other foods Herbs. Spices. Seasoning mixes without salt. Unsalted popcorn and pretzels. Fat-free sweets. What foods are not recommended? The items listed may not be a complete list. Talk with your dietitian about what dietary choices are best for you. Grains Baked goods made with fat, such as croissants, muffins, or some breads. Dry pasta or rice meal packs. Vegetables Creamed or fried vegetables. Vegetables in a cheese sauce. Regular canned vegetables (not low-sodium or reduced-sodium). Regular canned tomato sauce and paste (not low-sodium or reduced-sodium). Regular tomato and vegetable juice (not low-sodium or reduced-sodium). Rosita Fire. Olives. Fruits Canned fruit in a light or heavy syrup. Fried fruit. Fruit in cream or butter sauce. Meat and other protein foods Fatty cuts of meat. Ribs. Fried meat. Tomasa Blase. Sausage. Bologna and other processed lunch meats. Salami. Fatback. Hotdogs. Bratwurst. Salted nuts and seeds. Canned beans with added salt. Canned or smoked fish. Whole eggs or egg yolks. Chicken or [place] with skin. Dairy Whole or 2% milk, cream, and half-and-half. Whole or full-fat cream cheese. Whole-fat or sweetened yogurt. Full-fat cheese. Nondairy creamers. Whipped toppings. Processed cheese  and cheese spreads. Fats and oils Butter. Stick margarine. Lard. Shortening. Ghee. Bacon fat. Tropical oils, such as coconut, palm kernel, or palm oil. Seasoning and other foods Salted popcorn and pretzels. Onion salt, garlic salt, seasoned salt, table salt, and sea salt. Worcestershire sauce. Tartar sauce. Barbecue sauce. Teriyaki sauce. Soy sauce, including reduced-sodium. Steak sauce. Canned and packaged gravies. Fish sauce. Oyster sauce. Cocktail sauce. Horseradish that you find on the shelf. Ketchup. Mustard. Meat flavorings and tenderizers. Bouillon cubes. Hot sauce and Tabasco sauce. Premade or packaged marinades. Premade or packaged taco seasonings.  Relishes. Regular salad dressings. Where to find more information:  National Heart, Lung, and Blood Institute: PopSteam.is  American Heart Association: www.heart.org Summary  The DASH eating plan is a healthy eating plan that has been shown to reduce high blood pressure (hypertension). It may also reduce your risk for type 2 diabetes, heart disease, and stroke.  With the DASH eating plan, you should limit salt (sodium) intake to 2,300 mg a day. If you have hypertension, you may need to reduce your sodium intake to 1,500 mg a day.  When on the DASH eating plan, aim to eat more fresh fruits and vegetables, whole grains, lean proteins, low-fat dairy, and heart-healthy fats.  Work with your health care provider or diet and nutrition specialist (dietitian) to adjust your eating plan to your individual calorie needs. This information is not intended to replace advice given to you by your health care provider. Make sure you discuss any questions you have with your health care provider. Document Revised: 03/31/2017 Document Reviewed: 04/11/2016 Elsevier Patient Education  2020 [employer].      Signed, Debbe Odea, MD  01/28/2020 10:29 AM    [facility]

## 2020-01-28 NOTE — Patient Instructions (Signed)
Medication Instructions:  Your physician has recommended you make the following change in your medication:   1)  STOP taking you metoprolol succinate (TOPROL-XL). 2)  Change the way you take losartan (COZAAR) 100 MG tablet: Take 1 tablet (100 mg total) by mouth daily. 3)  START taking carvedilol (COREG) 25 MG tablet: Take 1 tablet (25 mg total) by mouth 2 (two) times daily.  *If you need a refill on your cardiac medications before your next appointment, please call your pharmacy*   Lab Work: None Ordered If you have labs (blood work) drawn today and your tests are completely normal, you will receive your results only by: Marland Kitchen MyChart Message (if you have MyChart) OR . A paper copy in the mail If you have any lab test that is abnormal or we need to change your treatment, we will call you to review the results.   Testing/Procedures: None Ordered   Follow-Up: At [facility], you and your health needs are our priority.  As part of our continuing mission to provide you with exceptional heart care, we have created designated Provider Care Teams.  These Care Teams include your primary Cardiologist (physician) and Advanced Practice Providers (APPs -  Physician Assistants and Nurse Practitioners) who all work together to provide you with the care you need, when you need it.  We recommend signing up for the patient portal called "MyChart".  Sign up information is provided on this After Visit Summary.  MyChart is used to connect with patients for Virtual Visits (Telemedicine).  Patients are able to view lab/test results, encounter notes, upcoming appointments, etc.  Non-urgent messages can be sent to your provider as well.   To learn more about what you can do with MyChart, go to [email or web address].    Your next appointment:   1 month(s)  The format for your next appointment:   In Person  Provider:   Debbe Odea, MD   Other Instructions   DASH Eating Plan DASH stands for  "Dietary Approaches to Stop Hypertension." The DASH eating plan is a healthy eating plan that has been shown to reduce high blood pressure (hypertension). It may also reduce your risk for type 2 diabetes, heart disease, and stroke. The DASH eating plan may also help with weight loss. What are tips for following this plan?  General guidelines  Avoid eating more than 2,300 mg (milligrams) of salt (sodium) a day. If you have hypertension, you may need to reduce your sodium intake to 1,500 mg a day.  Limit alcohol intake to no more than 1 drink a day for nonpregnant women and 2 drinks a day for men. One drink equals 12 oz of beer, 5 oz of wine, or 1 oz of hard liquor.  Work with your health care provider to maintain a healthy body weight or to lose weight. Ask what an ideal weight is for you.  Get at least 30 minutes of exercise that causes your heart to beat faster (aerobic exercise) most days of the week. Activities may include walking, swimming, or biking.  Work with your health care provider or diet and nutrition specialist (dietitian) to adjust your eating plan to your individual calorie needs. Reading food labels   Check food labels for the amount of sodium per serving. Choose foods with less than 5 percent of the Daily Value of sodium. Generally, foods with less than 300 mg of sodium per serving fit into this eating plan.  To find whole grains, look for the word "whole"  as the first word in the ingredient list. Shopping  Buy products labeled as "low-sodium" or "no salt added."  Buy fresh foods. Avoid canned foods and premade or frozen meals. Cooking  Avoid adding salt when cooking. Use salt-free seasonings or herbs instead of table salt or sea salt. Check with your health care provider or pharmacist before using salt substitutes.  Do not fry foods. Cook foods using healthy methods such as baking, boiling, grilling, and broiling instead.  Cook with heart-healthy oils, such as olive,  canola, soybean, or sunflower oil. Meal planning  Eat a balanced diet that includes: ? 5 or more servings of fruits and vegetables each day. At each meal, try to fill half of your plate with fruits and vegetables. ? Up to 6-8 servings of whole grains each day. ? Less than 6 oz of lean meat, poultry, or fish each day. A 3-oz serving of meat is about the same size as a deck of cards. One egg equals 1 oz. ? 2 servings of low-fat dairy each day. ? A serving of nuts, seeds, or beans 5 times each week. ? Heart-healthy fats. Healthy fats called Omega-3 fatty acids are found in foods such as flaxseeds and coldwater fish, like sardines, salmon, and mackerel.  Limit how much you eat of the following: ? Canned or prepackaged foods. ? Food that is high in trans fat, such as fried foods. ? Food that is high in saturated fat, such as fatty meat. ? Sweets, desserts, sugary drinks, and other foods with added sugar. ? Full-fat dairy products.  Do not salt foods before eating.  Try to eat at least 2 vegetarian meals each week.  Eat more home-cooked food and less restaurant, buffet, and fast food.  When eating at a restaurant, ask that your food be prepared with less salt or no salt, if possible. What foods are recommended? The items listed may not be a complete list. Talk with your dietitian about what dietary choices are best for you. Grains Whole-grain or whole-wheat bread. Whole-grain or whole-wheat pasta. Brown rice. Orpah Cobb. Bulgur. Whole-grain and low-sodium cereals. Pita bread. Low-fat, low-sodium crackers. Whole-wheat flour tortillas. Vegetables Fresh or frozen vegetables (raw, steamed, roasted, or grilled). Low-sodium or reduced-sodium tomato and vegetable juice. Low-sodium or reduced-sodium tomato sauce and tomato paste. Low-sodium or reduced-sodium canned vegetables. Fruits All fresh, dried, or frozen fruit. Canned fruit in natural juice (without added sugar). Meat and other protein  foods Skinless chicken or [place]. Ground chicken or [place]. Pork with fat trimmed off. Fish and seafood. Egg whites. Dried beans, peas, or lentils. Unsalted nuts, nut butters, and seeds. Unsalted canned beans. Lean cuts of beef with fat trimmed off. Low-sodium, lean deli meat. Dairy Low-fat (1%) or fat-free (skim) milk. Fat-free, low-fat, or reduced-fat cheeses. Nonfat, low-sodium ricotta or cottage cheese. Low-fat or nonfat yogurt. Low-fat, low-sodium cheese. Fats and oils Soft margarine without trans fats. Vegetable oil. Low-fat, reduced-fat, or light mayonnaise and salad dressings (reduced-sodium). Canola, safflower, olive, soybean, and sunflower oils. Avocado. Seasoning and other foods Herbs. Spices. Seasoning mixes without salt. Unsalted popcorn and pretzels. Fat-free sweets. What foods are not recommended? The items listed may not be a complete list. Talk with your dietitian about what dietary choices are best for you. Grains Baked goods made with fat, such as croissants, muffins, or some breads. Dry pasta or rice meal packs. Vegetables Creamed or fried vegetables. Vegetables in a cheese sauce. Regular canned vegetables (not low-sodium or reduced-sodium). Regular canned tomato sauce and paste (  not low-sodium or reduced-sodium). Regular tomato and vegetable juice (not low-sodium or reduced-sodium). Rosita Fire. Olives. Fruits Canned fruit in a light or heavy syrup. Fried fruit. Fruit in cream or butter sauce. Meat and other protein foods Fatty cuts of meat. Ribs. Fried meat. Tomasa Blase. Sausage. Bologna and other processed lunch meats. Salami. Fatback. Hotdogs. Bratwurst. Salted nuts and seeds. Canned beans with added salt. Canned or smoked fish. Whole eggs or egg yolks. Chicken or [place] with skin. Dairy Whole or 2% milk, cream, and half-and-half. Whole or full-fat cream cheese. Whole-fat or sweetened yogurt. Full-fat cheese. Nondairy creamers. Whipped toppings. Processed cheese and cheese  spreads. Fats and oils Butter. Stick margarine. Lard. Shortening. Ghee. Bacon fat. Tropical oils, such as coconut, palm kernel, or palm oil. Seasoning and other foods Salted popcorn and pretzels. Onion salt, garlic salt, seasoned salt, table salt, and sea salt. Worcestershire sauce. Tartar sauce. Barbecue sauce. Teriyaki sauce. Soy sauce, including reduced-sodium. Steak sauce. Canned and packaged gravies. Fish sauce. Oyster sauce. Cocktail sauce. Horseradish that you find on the shelf. Ketchup. Mustard. Meat flavorings and tenderizers. Bouillon cubes. Hot sauce and Tabasco sauce. Premade or packaged marinades. Premade or packaged taco seasonings. Relishes. Regular salad dressings. Where to find more information:  National Heart, Lung, and Blood Institute: PopSteam.is  American Heart Association: www.heart.org Summary  The DASH eating plan is a healthy eating plan that has been shown to reduce high blood pressure (hypertension). It may also reduce your risk for type 2 diabetes, heart disease, and stroke.  With the DASH eating plan, you should limit salt (sodium) intake to 2,300 mg a day. If you have hypertension, you may need to reduce your sodium intake to 1,500 mg a day.  When on the DASH eating plan, aim to eat more fresh fruits and vegetables, whole grains, lean proteins, low-fat dairy, and heart-healthy fats.  Work with your health care provider or diet and nutrition specialist (dietitian) to adjust your eating plan to your individual calorie needs. This information is not intended to replace advice given to you by your health care provider. Make sure you discuss any questions you have with your health care provider. Document Revised: 03/31/2017 Document Reviewed: 04/11/2016 Elsevier Patient Education  2020 [employer].

## 2020-01-28 NOTE — Patient Instructions (Signed)
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 Very busy balancing caring for them, work, and school.  o Most recent home weight: 275-280 lbs (changes based on bloating)  Obesity; complicated by chronic medical conditions including pre-diabetes, HTN, most recent BMI 50.3, previous BMI 49.2 (08/2019)  Current meal patterns: Patient used to meal plan, but has not been doing this recently.  o Breakfast: Slimfast shake takes with her to class. Or boiled egg/fruit.  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 Elliptical (up to 60 minutes); sometimes would walk 3 miles within 1 hour.   Current blood glucose readings: n/a  Current weight management pharmacotherapy: n/a. Decided to pursue GLP1 coverage. PA submitted to insurance for Saxenda. PA denied.  o Hx Contrave. Phentermine-containing options are not appropriate given HTN  HTN: sees Dr. Azucena Cecil next week. HCTZ 25 mg daily, metoprolol XR 100 mg daily, losartan 50 mg BID.  Depression: sertraline 50 mg daily  Pharmacist Clinical Goal(s):   Over the next 90 days, patient will work with PharmD and primary care provider to work towards 5-10% body weight loss  Interventions:  PA denial notes that for medication coverage, patient must be using Saxenda for weight loss and must be engaged in concurrent lifestyle modification. Printed my note  that demonstrates this information, faxed appeal into [employer]. Will await determination. Will f/u in ~1 week if I have not heard back.  Patient Self Care Activities:   Patient will adhere to dietary modifications  Patient will target at least 150 minutes of moderate intensity exercise weekly  Patient will report any questions or concerns to provider   Please see past updates related to this goal by clicking on the "Past Updates" button in the selected goal         The patient verbalized understanding of instructions provided today and declined a print copy of patient instruction materials.    Plan:  - Will f/u with [employer] by next week regarding result of appeal  [name] Feliz Beam, PharmD, [place], CPP Clinical Pharmacist [facility] [employer] [phone]

## 2020-01-29 NOTE — Telephone Encounter (Signed)
PA determination could not be made at this time; appeal is currently in process.

## 2020-01-29 NOTE — Telephone Encounter (Signed)
Started PA in [email or web address]; Key: [id number]. Awaiting determination.

## 2020-01-30 ENCOUNTER — Ambulatory Visit: Payer: Managed Care, Other (non HMO) | Admitting: Pharmacist

## 2020-01-30 NOTE — Telephone Encounter (Signed)
Please notify her:  I cancelled the GI referral.  Placed gen surgery referral for her hemorrhoids [name] will call her to start her on the Saxenda since she got approval.

## 2020-01-30 NOTE — Addendum Note (Signed)
Addended by: Amedeo Kinsman A on: 01/30/2020 10:08 AM   Modules accepted: Orders

## 2020-01-30 NOTE — Telephone Encounter (Signed)
Appeal has been approved for saxenda. Patient would like GI referral cxl and would like to have consult with general surgery.

## 2020-01-30 NOTE — Chronic Care Management (AMB) (Signed)
Chronic Care Management   Follow Up Note   01/30/2020 Name: Erica Ramos MRN: [id number] DOB: 10/03/1975  Referred by: Theadore Nan, NP Reason for referral : Chronic Care Management (Medication Management)   Erica Ramos is a 44 y.o. year old female who is a primary care patient of Theadore Nan, NP. The CCM team was consulted for assistance with chronic disease management and care coordination needs.    Contacted patient for medication access follow up  Review of patient status, including review of consultants reports, relevant laboratory and other test results, and collaboration with appropriate care team members and the patient's provider was performed as part of comprehensive patient evaluation and provision of chronic care management services.    SDOH (Social Determinants of Health) assessments performed: No See Care Plan activities for detailed interventions related to [facility])     Outpatient Encounter Medications as of 01/30/2020  Medication Sig  . acetaminophen (TYLENOL) 500 MG tablet Take 500-1,000 mg by mouth daily as needed for headache.   . calcium carbonate (TUMS - DOSED IN MG ELEMENTAL CALCIUM) 500 MG chewable tablet Chew 3-4 tablets by mouth daily as needed for indigestion or heartburn.  . carvedilol (COREG) 25 MG tablet Take 1 tablet (25 mg total) by mouth 2 (two) times daily.  . cetirizine (ZYRTEC) 10 MG tablet Take 1 tablet (10 mg total) by mouth daily.  Marland Kitchen GARLIC PO Take 1 capsule by mouth daily.  . hydrochlorothiazide (HYDRODIURIL) 25 MG tablet TAKE 1 TABLET(25 MG) BY MOUTH DAILY  . Liraglutide -Weight Management (SAXENDA) 18 MG/3ML SOPN Inject 0.6 mg daily. Titrate as instructed. Max daily dose 3 mg (Patient not taking: Reported on 01/28/2020)  . losartan (COZAAR) 100 MG tablet Take 1 tablet (100 mg total) by mouth daily.  Marland Kitchen omeprazole (PRILOSEC) 20 MG capsule Take 1 capsule (20 mg total) by mouth daily. (Patient taking differently: Take 20 mg by mouth as  needed. )  . Prenatal Vit-Fe Fumarate-FA (MULTIVITAMIN-PRENATAL) 27-0.8 MG TABS tablet Take 1 tablet by mouth daily.   . sertraline (ZOLOFT) 50 MG tablet Take 1 tablet (50 mg total) by mouth daily. Take 1/2 tab daily for 1 week and then go to 1 tab daily if tolerated.  . triamcinolone (NASACORT AQ) 55 MCG/ACT AERO nasal inhaler Place 2 sprays into the nose daily. (Patient taking differently: Place 1 spray into the nose daily. )  . triamcinolone cream (KENALOG) 0.1 % Apply 1 application topically 2 (two) times daily as needed.   No facility-administered encounter medications on file as of 01/30/2020.     Objective:   Goals Addressed              This Visit's Progress     Patient Stated   .  "I need help losing weight" (pt-stated)        CARE PLAN ENTRY (see longitudinal plan of care for additional care plan information)  Current Barriers:  . Social, financial, community barriers:  o Received notification that PA was approved for [employer] o In school for paralegal. 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 Very busy balancing caring for them, work, and school.  o Most recent home weight: 275-280 lbs (changes based on bloating) . Obesity; complicated by chronic medical conditions including pre-diabetes, HTN, most recent BMI 50.3, previous BMI 49.2 (08/2019) . Current meal patterns: working on meal planning more consistently  . Current exercise:  o Plan to work out daily. Elliptical (up to 60 minutes); sometimes would walk 3 miles within 1 hour.  Marland Kitchen  Current blood glucose readings: n/a . Current weight management pharmacotherapy: PA approved for Saxenda  o Hx Contrave. Phentermine-containing options are not appropriate given HTN . HTN: sees Dr. Azucena Cecil next week. HCTZ 25 mg daily, carvedilol 25 mg BID, losartan 100 mg daily . Depression: sertraline 50 mg daily  Pharmacist Clinical Goal(s):  Marland Kitchen Over the next 90 days, patient will work with PharmD and primary care provider to work towards 5-10% body weight  loss  Interventions: . Contacted [facility]. Script ran through for $24.99/30 day supply. Contacted patient, reviewed titration, counseling points. Discussed videos on Saxenda website. Patient requests a face to face demonstration visit. Scheduled face to face next week. Will also f/u on blood pressure since medication changes made by Dr. Azucena Cecil  Patient Self Care Activities:  . Patient will adhere to dietary modifications . Patient will target at least 150 minutes of moderate intensity exercise weekly . Patient will report any questions or concerns to provider   Please see past updates related to this goal by clicking on the "Past Updates" button in the selected goal          Plan:  - Scheduled f/u face to face next week  [name] Feliz Beam, PharmD, [place], CPP Clinical Pharmacist [facility] [employer] [phone]

## 2020-01-30 NOTE — Telephone Encounter (Signed)
Left patient message about below

## 2020-01-30 NOTE — Patient Instructions (Signed)
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 Very busy balancing caring for them, work, and school.  o Most recent home weight: 275-280 lbs (changes based on bloating)  Obesity; complicated by chronic medical conditions including pre-diabetes, HTN, most recent BMI 50.3, previous BMI 49.2 (08/2019)  Current meal patterns: working on meal planning more consistently   Current exercise:  o Plan to work out daily. Elliptical (up to 60 minutes); sometimes would walk 3 miles within 1 hour.   Current blood glucose readings: n/a  Current weight management pharmacotherapy: PA approved for Saxenda  o Hx Contrave. Phentermine-containing options are not appropriate given HTN  HTN: sees Dr. Azucena Cecil next week. HCTZ 25 mg daily, carvedilol 25 mg BID, losartan 100 mg daily  Depression: sertraline 50 mg daily  Pharmacist Clinical Goal(s):   Over the next 90 days, patient will work with PharmD and primary care provider to work towards 5-10% body weight loss  Interventions:  [facility]ed [facility]. Script ran through for $24.99/30 day supply. Contacted patient, reviewed titration, counseling points. Discussed videos on Saxenda website. Patient requests a face to face demonstration visit. Scheduled face to face next week. Will also f/u on blood pressure since medication changes made by Dr. Azucena Cecil  Patient Self Care Activities:   Patient will adhere to dietary modifications  Patient will target at least 150 minutes of moderate intensity exercise weekly  Patient will report any questions or concerns to provider   Please see past updates  related to this goal by clicking on the "Past Updates" button in the selected goal         The patient verbalized understanding of instructions provided today and declined a print copy of patient instruction materials.      Plan:  - Scheduled f/u face to face next week  [name] Feliz Beam, PharmD, [place], CPP Clinical Pharmacist [facility] [employer] [phone]

## 2020-02-03 ENCOUNTER — Ambulatory Visit: Payer: Self-pay | Admitting: Surgery

## 2020-02-05 ENCOUNTER — Other Ambulatory Visit: Payer: Self-pay

## 2020-02-06 ENCOUNTER — Encounter: Payer: Self-pay | Admitting: Obstetrics and Gynecology

## 2020-02-07 ENCOUNTER — Ambulatory Visit: Payer: Managed Care, Other (non HMO)

## 2020-02-10 ENCOUNTER — Other Ambulatory Visit: Payer: Self-pay

## 2020-02-10 ENCOUNTER — Telehealth: Payer: Managed Care, Other (non HMO)

## 2020-02-10 ENCOUNTER — Ambulatory Visit: Payer: Self-pay | Admitting: Surgery

## 2020-02-12 ENCOUNTER — Ambulatory Visit: Payer: Managed Care, Other (non HMO)

## 2020-02-14 ENCOUNTER — Telehealth: Payer: Self-pay | Admitting: Nurse Practitioner

## 2020-02-14 NOTE — Telephone Encounter (Signed)
Referral has been canceled due to insurance will not cover screening colonoscopy due to age

## 2020-02-18 ENCOUNTER — Encounter: Payer: Self-pay | Admitting: Obstetrics and Gynecology

## 2020-02-21 ENCOUNTER — Telehealth: Payer: Managed Care, Other (non HMO)

## 2020-02-21 ENCOUNTER — Telehealth: Payer: Self-pay | Admitting: Pharmacist

## 2020-02-21 NOTE — Progress Notes (Signed)
  Chronic Care Management   Note  02/21/2020 Name: Erica Ramos MRN: [id number] DOB: 1975-09-27   Attempted to contact patient for scheduled appointment for medication management support. Planned face to face appointment today for Saxenda teaching and BP follow up. Called patient to discuss talking through Saxenda injection technique; LVM for her to return my call   Plan: - If I do not hear back from the patient by end of business today, will collaborate with office staff to schedule nurse visit for Saxenda teaching and BP check.   [name] Feliz Beam, PharmD, [place], CPP Clinical Pharmacist [facility] [employer] [phone]

## 2020-02-24 ENCOUNTER — Ambulatory Visit: Payer: Managed Care, Other (non HMO) | Admitting: Pharmacist

## 2020-02-24 DIAGNOSIS — R7303 Prediabetes: Secondary | ICD-10-CM

## 2020-02-24 MED ORDER — PEN NEEDLES 32G X 4 MM MISC: 3 refills | Status: DC

## 2020-02-24 NOTE — Patient Instructions (Signed)
Visit Information  Goals Addressed              This Visit's Progress     Patient Stated   .  "I need help losing weight" (pt-stated)        CARE PLAN ENTRY (see longitudinal plan of care for additional care plan information)  Current Barriers:  . Social, financial, community barriers:  o Picked up [place] on 10/12, has had to r/s injection teaching appointments so has not started therapy yet. 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 Very busy balancing caring for them, work, and school.  o Most recent home weight: 275-280 lbs (changes based on bloating) . Obesity; complicated by chronic medical conditions including pre-diabetes, HTN, most recent BMI 50.3, previous BMI 49.2 (08/2019) . Current meal patterns: working on lunch meal planning; reducing carbohydrate content in meals  . Current exercise:  o Goal to work out daily, has elliptical at home. . Current blood glucose readings: n/a . Current weight management pharmacotherapy: Saxenda prescribed, has not started yet.  o Hx Contrave. Phentermine-containing options are not appropriate given HTN . HTN: Follows w/ Dr. Azucena Cecil HCTZ 25 mg daily, carvedilol 25 mg BID, losartan 100 mg daily . Depression: sertraline 50 mg daily  Pharmacist Clinical Goal(s):  Marland Kitchen Over the next 90 days, patient will work with PharmD and primary care provider to work towards 5-10% body weight loss  Interventions: . Bernie Covey can be used for up to 30 days after reaching room temperature. Patient will start with current supply, then discard after November 12th. She will be due for a refill at that time, so will pick up a new supply and keep in the refrigerator . Script for pen needles sent.  . Counseled on administration technique. Recommend starting at 0.6 mg daily for 1 week, then increasing at 0.6 mg intervals as tolerated on a weekly basis. Counseled that she could stay at  a particular dose for longer based on tolerability. Reviewed mechanism and side effect of stomach fullness, recommended reducing portion sizes to reduce risk of GI upset. Patient verbalizes understanding.   Patient Self Care Activities:  . Patient will adhere to dietary modifications . Patient will target at least 150 minutes of moderate intensity exercise weekly . Patient will report any questions or concerns to provider   Please see past updates related to this goal by clicking on the "Past Updates" button in the selected goal         The patient verbalized understanding of instructions provided today and declined a print copy of patient instruction materials.   Plan:  - Scheduled f/u call in ~ 4 weeks  [name] Feliz Beam, PharmD, [place], CPP Clinical Pharmacist [facility] [employer] [phone]

## 2020-02-24 NOTE — Chronic Care Management (AMB) (Signed)
Chronic Care Management   Follow Up Note   02/24/2020 Name: Erica Ramos MRN: [id number] DOB: 1975-09-11  Referred by: Theadore Nan, NP Reason for referral : Chronic Care Management (Medication Management)   Erica Ramos is a 44 y.o. year old female who is a primary care patient of Theadore Nan, NP. The CCM team was consulted for assistance with chronic disease management and care coordination needs.    Patient returned my call regarding medication management.   Review of patient status, including review of consultants reports, relevant laboratory and other test results, and collaboration with appropriate care team members and the patient's provider was performed as part of comprehensive patient evaluation and provision of chronic care management services.    SDOH (Social Determinants of Health) assessments performed: No See Care Plan activities for detailed interventions related to [facility])     Outpatient Encounter Medications as of 02/24/2020  Medication Sig   acetaminophen (TYLENOL) 500 MG tablet Take 500-1,000 mg by mouth daily as needed for headache.    calcium carbonate (TUMS - DOSED IN MG ELEMENTAL CALCIUM) 500 MG chewable tablet Chew 3-4 tablets by mouth daily as needed for indigestion or heartburn.   carvedilol (COREG) 25 MG tablet Take 1 tablet (25 mg total) by mouth 2 (two) times daily.   cetirizine (ZYRTEC) 10 MG tablet Take 1 tablet (10 mg total) by mouth daily.   GARLIC PO Take 1 capsule by mouth daily.   hydrochlorothiazide (HYDRODIURIL) 25 MG tablet TAKE 1 TABLET(25 MG) BY MOUTH DAILY   Insulin Pen Needle (PEN NEEDLES) 32G X 4 MM MISC Use to inject Saxenda   Liraglutide -Weight Management (SAXENDA) 18 MG/3ML SOPN Inject 0.6 mg daily. Titrate as instructed. Max daily dose 3 mg (Patient not taking: Reported on 01/28/2020)   losartan (COZAAR) 100 MG tablet Take 1 tablet (100 mg total) by mouth daily.   omeprazole (PRILOSEC) 20 MG capsule Take 1  capsule (20 mg total) by mouth daily. (Patient taking differently: Take 20 mg by mouth as needed. )   Prenatal Vit-Fe Fumarate-FA (MULTIVITAMIN-PRENATAL) 27-0.8 MG TABS tablet Take 1 tablet by mouth daily.    sertraline (ZOLOFT) 50 MG tablet Take 1 tablet (50 mg total) by mouth daily. Take 1/2 tab daily for 1 week and then go to 1 tab daily if tolerated.   triamcinolone (NASACORT AQ) 55 MCG/ACT AERO nasal inhaler Place 2 sprays into the nose daily. (Patient taking differently: Place 1 spray into the nose daily. )   triamcinolone cream (KENALOG) 0.1 % Apply 1 application topically 2 (two) times daily as needed.   No facility-administered encounter medications on file as of 02/24/2020.     Objective:   Goals Addressed              This Visit's Progress     Patient Stated     "I need help losing weight" (pt-stated)        CARE PLAN ENTRY (see longitudinal plan of care for additional care plan information)  Current Barriers:   Social, financial, community barriers:  o Picked up Spangle on 10/12, has had to r/s injection teaching appointments so has not started therapy yet. 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 Very busy balancing caring for them, work, and school.  o Most recent home weight: 275-280 lbs (changes based on bloating)  Obesity; complicated by chronic medical conditions including pre-diabetes, HTN, most recent  BMI 50.3, previous BMI 49.2 (08/2019)  Current meal patterns: working on lunch meal planning; reducing carbohydrate content in meals   Current exercise:  o Goal to work out daily, has elliptical at home.  Current blood glucose readings: n/a  Current weight management pharmacotherapy: Saxenda prescribed, has not started yet.  o Hx Contrave. Phentermine-containing options are not appropriate given HTN  HTN: Follows w/ Dr. Azucena Cecil HCTZ 25 mg daily, carvedilol 25 mg BID,  losartan 100 mg daily  Depression: sertraline 50 mg daily  Pharmacist Clinical Goal(s):   Over the next 90 days, patient will work with PharmD and primary care provider to work towards 5-10% body weight loss  Interventions:  Bernie Covey can be used for up to 30 days after reaching room temperature. Patient will start with current supply, then discard after November 12th. She will be due for a refill at that time, so will pick up a new supply and keep in the refrigerator  Script for pen needles sent.   Counseled on administration technique. Recommend starting at 0.6 mg daily for 1 week, then increasing at 0.6 mg intervals as tolerated on a weekly basis. Counseled that she could stay at a particular dose for longer based on tolerability. Reviewed mechanism and side effect of stomach fullness, recommended reducing portion sizes to reduce risk of GI upset. Patient verbalizes understanding.   Patient Self Care Activities:   Patient will adhere to dietary modifications  Patient will target at least 150 minutes of moderate intensity exercise weekly  Patient will report any questions or concerns to provider   Please see past updates related to this goal by clicking on the "Past Updates" button in the selected goal          Plan:  - Scheduled f/u call in ~ 4 weeks  [name] Feliz Beam, PharmD, [place], CPP Clinical Pharmacist [facility] [employer] [phone]

## 2020-02-25 ENCOUNTER — Telehealth: Payer: Self-pay

## 2020-02-25 NOTE — Telephone Encounter (Signed)
lvm for pt schedule a NURSE visit for [facility] teaching if she feels she needs that. She is free to start the medication on her own if she feels comfortable doing so

## 2020-03-02 ENCOUNTER — Telehealth: Payer: Managed Care, Other (non HMO)

## 2020-03-04 ENCOUNTER — Ambulatory Visit: Payer: Self-pay | Admitting: Surgery

## 2020-03-09 ENCOUNTER — Ambulatory Visit: Payer: Managed Care, Other (non HMO) | Admitting: Cardiology

## 2020-03-18 ENCOUNTER — Ambulatory Visit: Payer: Self-pay | Admitting: Surgery

## 2020-03-20 ENCOUNTER — Ambulatory Visit: Payer: Managed Care, Other (non HMO) | Admitting: Cardiology

## 2020-03-24 ENCOUNTER — Telehealth: Payer: Managed Care, Other (non HMO)

## 2020-03-24 ENCOUNTER — Telehealth: Payer: Self-pay | Admitting: Pharmacist

## 2020-03-24 NOTE — Telephone Encounter (Signed)
  Chronic Care Management   Note  03/24/2020 Name: Erica Ramos MRN: [id number] DOB: [birth year]   Contacted patient for scheduled appointment for medication management support. Patient had planned to start Saxenda in the interim, but she notes today that she has been too scared and thinks she should meet with someone for administration technique teaching and support.   Plan: - Scheduled for earliest available that would work for her schedule; scheduled RN visit for [employer]. Rescheduled my phone call follow up for ~4 weeks later  [name] Feliz Beam, PharmD, [place], CPP Clinical Pharmacist [facility] [employer] [phone]

## 2020-03-25 ENCOUNTER — Ambulatory Visit: Payer: Self-pay | Admitting: Surgery

## 2020-04-08 ENCOUNTER — Ambulatory Visit: Payer: Managed Care, Other (non HMO)

## 2020-04-08 ENCOUNTER — Telehealth: Payer: Self-pay | Admitting: Nurse Practitioner

## 2020-04-08 NOTE — Telephone Encounter (Signed)
Patient was informed that [facility]  Taking [place] patients

## 2020-04-13 ENCOUNTER — Ambulatory Visit: Payer: Managed Care, Other (non HMO) | Admitting: Cardiology

## 2020-04-15 ENCOUNTER — Ambulatory Visit: Payer: Managed Care, Other (non HMO)

## 2020-04-23 ENCOUNTER — Ambulatory Visit: Payer: Managed Care, Other (non HMO) | Admitting: Cardiology

## 2020-04-27 ENCOUNTER — Telehealth: Payer: Managed Care, Other (non HMO)

## 2020-04-27 ENCOUNTER — Telehealth: Payer: Self-pay | Admitting: Pharmacist

## 2020-04-27 NOTE — Telephone Encounter (Signed)
°  Chronic Care Management   Note  04/27/2020 Name: Erica Ramos MRN: [id number] DOB: 1975/12/19   Attempted to contact patient for scheduled appointment for medication management support. Was to be f/u on starting Saxenda, though patient did not keep nurse visit for injection teaching. LVM noting that she may call me back with immediate questions or concerns, but that she will need to establish with a new PCP. Discussed [facility].   [name] Feliz Beam, PharmD, [place], CPP Clinical Pharmacist [employer] [facility] [employer] [phone]

## 2020-05-15 ENCOUNTER — Telehealth: Payer: Self-pay | Admitting: Nurse Practitioner

## 2020-05-15 ENCOUNTER — Ambulatory Visit: Payer: Managed Care, Other (non HMO) | Admitting: Cardiology

## 2020-05-15 NOTE — Telephone Encounter (Signed)
Pt called wanting to get Coding changed on her 11/20/2019 she said that it was supposed to be a CPE not a follow up. Pt got upset when I tried to explain  Pt was given Billing and Coding number

## 2020-05-21 ENCOUNTER — Ambulatory Visit: Payer: Managed Care, Other (non HMO) | Admitting: Cardiology

## 2020-05-22 ENCOUNTER — Ambulatory Visit: Payer: Managed Care, Other (non HMO) | Admitting: Cardiology

## 2020-05-25 ENCOUNTER — Encounter: Payer: Self-pay | Admitting: Cardiology

## 2020-06-01 ENCOUNTER — Telehealth: Payer: Self-pay | Admitting: Cardiology

## 2020-06-01 DIAGNOSIS — I10 Essential (primary) hypertension: Secondary | ICD-10-CM

## 2020-06-01 MED ORDER — HYDROCHLOROTHIAZIDE 25 MG PO TABS: 25.0000 mg | ORAL_TABLET | Freq: Every day | ORAL | 1 refills | Status: DC

## 2020-06-01 NOTE — Telephone Encounter (Signed)
*  STAT* If patient is at the pharmacy, call can be transferred to refill team.   1. Which medications need to be refilled? (please list name of each medication and dose if known)    HCTZ 25 mg po q d   2. Which pharmacy/location (including street and city if local pharmacy) is medication to be sent to?   [address] [place]   3. Do they need a 30 day or 90 day supply? 90

## 2020-06-01 NOTE — Telephone Encounter (Signed)
Rx request sent to pharmacy.  

## 2020-06-11 ENCOUNTER — Encounter: Payer: Self-pay | Admitting: Cardiology

## 2020-06-11 ENCOUNTER — Ambulatory Visit (INDEPENDENT_AMBULATORY_CARE_PROVIDER_SITE_OTHER): Payer: BC Managed Care – PPO | Admitting: Cardiology

## 2020-06-11 ENCOUNTER — Other Ambulatory Visit: Payer: Self-pay

## 2020-06-11 VITALS — BP 158/110 | HR 68 | Ht 63.0 in | Wt 286.1 lb

## 2020-06-11 DIAGNOSIS — I10 Essential (primary) hypertension: Secondary | ICD-10-CM | POA: Diagnosis not present

## 2020-06-11 NOTE — Progress Notes (Signed)
Cardiology Office Note:    Date:  06/11/2020   ID:  Erica Ramos, DOB [birth year], MRN [id number]  PCP:  Theadore Nan, NP  [facility] Cardiologist:  Debbe Odea, MD  [facility] Electrophysiologist:  None   Referring MD: Theadore Nan, NP   Chief Complaint  Patient presents with  . Other    OD 1 month f/u no complaints today. Meds reviewed verbally with pt.    History of Present Illness:    Erica Ramos is a 45 y.o. female with a hx of hypertension, morbid obesity who presents for follow-up.  Last seen due to elevated blood pressures.  Has a history of hypertension for over 10 years.  Medications were adjusted after last visit , Toprol XL was stopped, Coreg was started.  Patient states not eating a low-salt diet. Also has been going through a lot of stress with being a single parent, daycare closings, starting a new job. She takes her medications as prescribed and blood pressures have been elevated. States been cheating very late at night/early in the mornings.    Past Medical History:  Diagnosis Date  . Abscess of axilla, left 05/01/2013  . Anxiety   . Boil, breast 10/22/2019  . Bronchitis    chronic - has flare every December  . Cervical polyp    hx of  . Chest pain 05/01/2013  . Delivered by cesarean section 06/05/2016  . Depression, major, single episode, moderate (HCC) 04/19/2018  . Environmental allergies   . Genital warts   . GERD (gastroesophageal reflux disease)    Per MD chart note 12/01/2010  . Heart murmur   . Hx of abnormal Pap smear 2012  . Hypertension   . Morbid obesity (HCC)    BMI 47.9  . Persistent headaches    worst during menstrual cycle  . PVC (premature ventricular contraction) 02/20/2015  . STD (sexually transmitted disease)    hx genital warts/?HSV  . Vaginal bleeding 07/15/2018  . Vaginal Pap smear, abnormal     Past Surgical History:  Procedure Laterality Date  . CESAREAN SECTION N/A 06/05/2016   Procedure:  CESAREAN SECTION;  Surgeon: Hoover Browns, MD;  Location: [facility] BIRTHING SUIT[facility];  Service: Obstetrics;  Laterality: N/A;  . CESAREAN SECTION N/A 09/20/2017   Procedure: CESAREAN SECTION;  Surgeon: Gerald Leitz, MD;  Location: [facility] BIRTHING SUIT[facility];  Service: Obstetrics;  Laterality: N/A;  EDD 10/02/17  . COLPOSCOPY  2012   no treatment to cervix--pap smears reverted to normal  . WISDOM TOOTH EXTRACTION      Current Medications: Current Meds  Medication Sig  . acetaminophen (TYLENOL) 500 MG tablet Take 500-1,000 mg by mouth daily as needed for headache.   . calcium carbonate (TUMS - DOSED IN MG ELEMENTAL CALCIUM) 500 MG chewable tablet Chew 3-4 tablets by mouth daily as needed for indigestion or heartburn.  . cetirizine (ZYRTEC) 10 MG tablet Take 1 tablet (10 mg total) by mouth daily.  Marland Kitchen GARLIC PO Take 1 capsule by mouth daily.  . hydrochlorothiazide (HYDRODIURIL) 25 MG tablet Take 1 tablet (25 mg total) by mouth daily.  Marland Kitchen losartan (COZAAR) 100 MG tablet Take 1 tablet (100 mg total) by mouth daily.  . metoprolol succinate (TOPROL-XL) 100 MG 24 hr tablet Take 100 mg by mouth daily.  Marland Kitchen omeprazole (PRILOSEC) 20 MG capsule Take 1 capsule (20 mg total) by mouth daily. (Patient taking differently: Take 20 mg by mouth as needed.)  . Prenatal Vit-Fe Fumarate-FA (MULTIVITAMIN-PRENATAL) 27-0.8 MG  TABS tablet Take 1 tablet by mouth daily.   . sertraline (ZOLOFT) 50 MG tablet Take 1 tablet (50 mg total) by mouth daily. Take 1/2 tab daily for 1 week and then go to 1 tab daily if tolerated.  . triamcinolone (NASACORT AQ) 55 MCG/ACT AERO nasal inhaler Place 2 sprays into the nose daily. (Patient taking differently: Place 1 spray into the nose daily.)  . triamcinolone cream (KENALOG) 0.1 % Apply 1 application topically 2 (two) times daily as needed.     Allergies:   Neosporin [neomycin-bacitracin zn-polymyx], Penicillins, and Strawberry flavor   Social History   Socioeconomic History  . Marital status: Single     Spouse name: Not on file  . Number of children: 0  . Years of education: Not on file  . Highest education level: Not on file  Occupational History    Employer: [employer]  Tobacco Use  . Smoking status: Never Smoker  . Smokeless tobacco: Never Used  Substance and Sexual Activity  . Alcohol use: Yes    Alcohol/week: 1.0 standard drink    Types: 1 Standard drinks or equivalent per week    Comment: Occasionally; less than 1-2 a week  . Drug use: No  . Sexual activity: Yes    Partners: Male    Birth control/protection: Condom    Comment: condoms sometimes  Other Topics Concern  . Not on file  Social History Narrative   Exercise--  2-3 x a week for about 30 min- 60 min   Social Determinants of Health   Financial Resource Strain: Low Risk   . Difficulty of Paying Living Expenses: Not hard at all  Food Insecurity: Not on file  Transportation Needs: Not on file  Physical Activity: Insufficiently Active  . Days of Exercise per Week: 2 days  . Minutes of Exercise per Session: 30 min  Stress: Not on file  Social Connections: Not on file     Family History: The patient's family history includes Alcohol abuse in her father; Diabetes in her father, maternal aunt, maternal grandmother, maternal uncle, and mother; Heart Problems in her maternal grandmother and paternal grandmother; Heart attack in her paternal grandmother; Hypertension in her maternal grandmother, mother, sister, and another family member; Obesity in an other family member; Seizures in her maternal aunt. There is no history of COPD.  ROS:   Please see the history of present illness.     All other systems reviewed and are negative.  EKGs/Labs/Other Studies Reviewed:    The following studies were reviewed today:   EKG:  EKG is  ordered today.  The ekg ordered today demonstrates normal sinus rhythm, normal ECG.  Recent Labs: 08/09/2019: TSH 2.060 11/20/2019: ALT 18; Hemoglobin 12.7; Platelets 310 01/08/2020:  BUN 13; Creatinine, Ser 0.80; Potassium 4.3; Sodium 140  Recent Lipid Panel    Component Value Date/Time   CHOL 137 01/08/2020 0939   TRIG 80 01/08/2020 0939   HDL 54 01/08/2020 0939   CHOLHDL 2.5 01/08/2020 0939   CHOLHDL 3 03/15/2019 1135   VLDL 17.4 03/15/2019 1135   LDLCALC 67 01/08/2020 0939    Physical Exam:    VS:  BP (!) 158/110 (BP Location: Left Arm, Patient Position: Sitting, Cuff Size: Large)   Pulse 68   Ht 5\' 3"  (1.6 m)   Wt 286 lb 2 oz (129.8 kg)   SpO2 98%   BMI 50.68 kg/m     Wt Readings from Last 3 Encounters:  06/11/20 286 lb 2 oz (  129.8 kg)  01/28/20 275 lb 8 oz (125 kg)  01/24/20 275 lb (124.7 kg)     GEN:  Well nourished, well developed in no acute distress HEENT: Normal NECK: No JVD; No carotid bruits LYMPHATICS: No lymphadenopathy CARDIAC: RRR, no murmurs, rubs, gallops RESPIRATORY:  Clear to auscultation without rales, wheezing or rhonchi  ABDOMEN: Soft, non-tender, distended MUSCULOSKELETAL:  No edema; No deformity  SKIN: Warm and dry NEUROLOGIC:  Alert and oriented x 3 PSYCHIATRIC:  Normal affect   ASSESSMENT:    1. Essential hypertension   2. Morbid obesity (HCC)    PLAN:    In order of problems listed above:  1. Patient with history of hypertension, blood pressure still uncontrolled.  She was educated on low-salt diet. Recommended starting Norvasc for additional BP benefit, but patient would not want to start additional medication at this time. She wants to try losing some weight, eating low-salt diet. Continue losartan 100 mg daily, Coreg 25 mg twice daily, HCTZ 25 mg daily.  If at follow-up visit, blood pressure still elevated, will recommend starting Norvasc. 2. Patient is morbidly obese, low calorie diet, weight loss advised.    Follow-up in 1 month.  Total encounter time 40 minutes  Greater than 50% was spent in counseling and coordination of care with the patient Time spent explaining to patient in details healthy cardiac  diet, low calorie diet, reasoning for BP control, adverse effects of long-term uncontrolled BP including endorgan damage.   Medication Adjustments/Labs and Tests Ordered: Current medicines are reviewed at length with the patient today.  Concerns regarding medicines are outlined above.  Orders Placed This Encounter  Procedures  . EKG 12-Lead   No orders of the defined types were placed in this encounter.   Patient Instructions  Medication Instructions:  Your physician recommends that you continue on your current medications as directed. Please refer to the Current Medication list given to you today.  *If you need a refill on your cardiac medications before your next appointment, please call your pharmacy*   Lab Work: None ordered If you have labs (blood work) drawn today and your tests are completely normal, you will receive your results only by: Marland Kitchen MyChart Message (if you have MyChart) OR . A paper copy in the mail If you have any lab test that is abnormal or we need to change your treatment, we will call you to review the results.   Testing/Procedures: None ordered   Follow-Up: At [facility], you and your health needs are our priority.  As part of our continuing mission to provide you with exceptional heart care, we have created designated Provider Care Teams.  These Care Teams include your primary Cardiologist (physician) and Advanced Practice Providers (APPs -  Physician Assistants and Nurse Practitioners) who all work together to provide you with the care you need, when you need it.  We recommend signing up for the patient portal called "MyChart".  Sign up information is provided on this After Visit Summary.  MyChart is used to connect with patients for Virtual Visits (Telemedicine).  Patients are able to view lab/test results, encounter notes, upcoming appointments, etc.  Non-urgent messages can be sent to your provider as well.   To learn more about what you can do with  MyChart, go to [email or web address].    Your next appointment:   1 month(s)  The format for your next appointment:   In Person  Provider:   Debbe Odea, MD   Other Instructions  Signed, Debbe Odea, MD  06/11/2020 1:06 PM    [facility]

## 2020-06-11 NOTE — Patient Instructions (Signed)
Medication Instructions:  Your physician recommends that you continue on your current medications as directed. Please refer to the Current Medication list given to you today.  *If you need a refill on your cardiac medications before your next appointment, please call your pharmacy*   Lab Work: None ordered If you have labs (blood work) drawn today and your tests are completely normal, you will receive your results only by: . MyChart Message (if you have MyChart) OR . A paper copy in the mail If you have any lab test that is abnormal or we need to change your treatment, we will call you to review the results.   Testing/Procedures: None ordered   Follow-Up: At [facility], you and your health needs are our priority.  As part of our continuing mission to provide you with exceptional heart care, we have created designated Provider Care Teams.  These Care Teams include your primary Cardiologist (physician) and Advanced Practice Providers (APPs -  Physician Assistants and Nurse Practitioners) who all work together to provide you with the care you need, when you need it.  We recommend signing up for the patient portal called "MyChart".  Sign up information is provided on this After Visit Summary.  MyChart is used to connect with patients for Virtual Visits (Telemedicine).  Patients are able to view lab/test results, encounter notes, upcoming appointments, etc.  Non-urgent messages can be sent to your provider as well.   To learn more about what you can do with MyChart, go to [email or web address].    Your next appointment:   1 month(s)  The format for your next appointment:   In Person  Provider:   Brian Agbor-Etang, MD   Other Instructions    

## 2020-06-17 ENCOUNTER — Encounter: Payer: Self-pay | Admitting: Adult Health

## 2020-06-17 ENCOUNTER — Ambulatory Visit (INDEPENDENT_AMBULATORY_CARE_PROVIDER_SITE_OTHER): Payer: Managed Care, Other (non HMO) | Admitting: Adult Health

## 2020-06-17 ENCOUNTER — Other Ambulatory Visit: Payer: Self-pay

## 2020-06-17 VITALS — BP 154/95 | HR 79 | Temp 98.1°F | Resp 16 | Ht 63.0 in | Wt 289.6 lb

## 2020-06-17 DIAGNOSIS — Z86018 Personal history of other benign neoplasm: Secondary | ICD-10-CM

## 2020-06-17 DIAGNOSIS — R7303 Prediabetes: Secondary | ICD-10-CM | POA: Diagnosis not present

## 2020-06-17 DIAGNOSIS — Z1389 Encounter for screening for other disorder: Secondary | ICD-10-CM

## 2020-06-17 DIAGNOSIS — I10 Essential (primary) hypertension: Secondary | ICD-10-CM | POA: Diagnosis not present

## 2020-06-17 DIAGNOSIS — E559 Vitamin D deficiency, unspecified: Secondary | ICD-10-CM

## 2020-06-17 LAB — POCT URINALYSIS DIPSTICK
Bilirubin, UA: NEGATIVE
Blood, UA: NEGATIVE
Glucose, UA: NEGATIVE
Ketones, UA: NEGATIVE
Leukocytes, UA: NEGATIVE
Nitrite, UA: NEGATIVE
Protein, UA: NEGATIVE
Spec Grav, UA: 1.015 (ref 1.010–1.025)
Urobilinogen, UA: 0.2 E.U./dL
pH, UA: 5 (ref 5.0–8.0)

## 2020-06-17 NOTE — Progress Notes (Signed)
New patient visit   Patient: Erica Ramos   DOB: 1975-12-05   45 y.o. Female  MRN: [id number] Visit Date: 06/17/2020  Today's healthcare provider: Jairo Ben, FNP   No chief complaint on file.  Subjective    Erica Ramos is a 45 y.o. female who presents today as a new patient to establish care.  HPI  Patient presents to establish care and states that she feels well today with no concerns to address. Patient reports that she follows a regular diet, is staying active by exercising weekly and states that sleep patterns are fairly well.Patient reports that her last pap exam has been within the past 3 years and denies any history of abnormal pap.    She saw Dr. Arlys John in Cardiology and she has made some lifestyle and diet changes. She has started exercises 30 minutes a day.   Referral for gynecology.   She is taking lexapro 50 mg tablet daily wants to try something  Different, concerned could be hindering weight loss.   Patient  denies any fever, body aches,chills, rash, chest pain, shortness of breath, nausea, vomiting, or diarrhea.  Denies dizziness, lightheadedness, pre syncopal or syncopal episodes.     Past Medical History:  Diagnosis Date  . Abscess of axilla, left 05/01/2013  . Allergy   . Anxiety   . Boil, breast 10/22/2019  . Bronchitis    chronic - has flare every December  . Cervical polyp    hx of  . Chest pain 05/01/2013  . Delivered by cesarean section 06/05/2016  . Depression, major, single episode, moderate (HCC) 04/19/2018  . Environmental allergies   . Genital warts   . GERD (gastroesophageal reflux disease)    Per MD chart note 12/01/2010  . Heart murmur   . Hx of abnormal Pap smear 2012  . Hypertension   . Morbid obesity (HCC)    BMI 47.9  . Persistent headaches    worst during menstrual cycle  . PVC (premature ventricular contraction) 02/20/2015  . STD (sexually transmitted disease)    hx genital warts/?HSV  . Vaginal bleeding  07/15/2018  . Vaginal Pap smear, abnormal    Past Surgical History:  Procedure Laterality Date  . CESAREAN SECTION N/A 06/05/2016   Procedure: CESAREAN SECTION;  Surgeon: Hoover Browns, MD;  Location: [facility];  Service: Obstetrics;  Laterality: N/A;  . CESAREAN SECTION N/A 09/20/2017   Procedure: CESAREAN SECTION;  Surgeon: Gerald Leitz, MD;  Location: [facility];  Service: Obstetrics;  Laterality: N/A;  EDD 10/02/17  . COLPOSCOPY  2012   no treatment to cervix--pap smears reverted to normal  . WISDOM TOOTH EXTRACTION     Family Status  Relation Name Status  . Mother  Alive  . Mat Aunt  Deceased  . MGM  Deceased  . Father  Alive  . Sister  Alive  . Brother  Alive  . Sister  Alive  . Sister  Alive  . Mat Uncle  (Not Specified)  . Other  (Not Specified)  . PGM  Deceased  . Neg Hx  (Not Specified)   Family History  Problem Relation Age of Onset  . Diabetes Mother   . Hypertension Mother        Under control  . Diabetes Maternal Aunt   . Seizures Maternal Aunt   . Diabetes Maternal Grandmother   . Hypertension Maternal Grandmother   . Heart Problems Maternal Grandmother   . Alcohol abuse Father   .  Diabetes Father   . Hypertension Sister   . Diabetes Maternal Uncle   . Hypertension Other   . Obesity Other   . Heart attack Paternal Grandmother   . Heart Problems Paternal Grandmother   . COPD Neg Hx    Social History   Socioeconomic History  . Marital status: Single    Spouse name: Not on file  . Number of children: 0  . Years of education: Not on file  . Highest education level: Not on file  Occupational History    Employer: [employer]  Tobacco Use  . Smoking status: Never Smoker  . Smokeless tobacco: Never Used  Substance and Sexual Activity  . Alcohol use: Yes    Alcohol/week: 1.0 standard drink    Types: 1 Standard drinks or equivalent per week    Comment: Occasionally; less than 1-2 a week  . Drug use: No  . Sexual activity: Yes     Partners: Male    Birth control/protection: Condom    Comment: condoms sometimes  Other Topics Concern  . Not on file  Social History Narrative   Exercise--  2-3 x a week for about 30 min- 60 min   Social Determinants of Health   Financial Resource Strain: Low Risk   . Difficulty of Paying Living Expenses: Not hard at all  Food Insecurity: Not on file  Transportation Needs: Not on file  Physical Activity: Insufficiently Active  . Days of Exercise per Week: 2 days  . Minutes of Exercise per Session: 30 min  Stress: Not on file  Social Connections: Not on file   Outpatient Medications Prior to Visit  Medication Sig  . acetaminophen (TYLENOL) 500 MG tablet Take 500-1,000 mg by mouth daily as needed for headache.   . calcium carbonate (TUMS - DOSED IN MG ELEMENTAL CALCIUM) 500 MG chewable tablet Chew 3-4 tablets by mouth daily as needed for indigestion or heartburn.  . carvedilol (COREG) 25 MG tablet Take 1 tablet (25 mg total) by mouth 2 (two) times daily.  . cetirizine (ZYRTEC) 10 MG tablet Take 1 tablet (10 mg total) by mouth daily.  Marland Kitchen GARLIC PO Take 1 capsule by mouth daily.  . hydrochlorothiazide (HYDRODIURIL) 25 MG tablet Take 1 tablet (25 mg total) by mouth daily.  Marland Kitchen losartan (COZAAR) 100 MG tablet Take 1 tablet (100 mg total) by mouth daily.  . metoprolol succinate (TOPROL-XL) 100 MG 24 hr tablet Take 100 mg by mouth daily.  Marland Kitchen omeprazole (PRILOSEC) 20 MG capsule Take 1 capsule (20 mg total) by mouth daily. (Patient taking differently: Take 20 mg by mouth as needed.)  . Prenatal Vit-Fe Fumarate-FA (MULTIVITAMIN-PRENATAL) 27-0.8 MG TABS tablet Take 1 tablet by mouth daily.   . sertraline (ZOLOFT) 50 MG tablet Take 1 tablet (50 mg total) by mouth daily. Take 1/2 tab daily for 1 week and then go to 1 tab daily if tolerated.  . triamcinolone (NASACORT AQ) 55 MCG/ACT AERO nasal inhaler Place 2 sprays into the nose daily. (Patient taking differently: Place 1 spray into the nose daily.)   . triamcinolone cream (KENALOG) 0.1 % Apply 1 application topically 2 (two) times daily as needed.   No facility-administered medications prior to visit.   Allergies  Allergen Reactions  . Neosporin [Neomycin-Bacitracin Zn-Polymyx] Anaphylaxis  . Penicillins Nausea And Vomiting, Rash and Other (See Comments)    Has patient had a PCN reaction causing immediate rash, facial/tongue/throat swelling, SOB or lightheadedness with hypotension: Yes Has patient had a PCN reaction  causing severe rash involving mucus membranes or skin necrosis: Yes Has patient had a PCN reaction that required hospitalization Yes Has patient had a PCN reaction occurring within the last 10 years: No If all of the above answers are "NO", then may proceed with Cephalosporin use.   . Bacitracin-Neomycin-Polymyxin Swelling  . Strawberry Flavor     Immunization History  Administered Date(s) Administered  . PFIZER(Purple Top)SARS-COV-2 Vaccination 08/22/2019, 09/19/2019  . Tdap 11/08/2012    Health Maintenance  Topic Date Due  . PAP SMEAR-Modifier  06/25/2020 (Originally 03/13/2020)  . COVID-19 Vaccine (3 - Booster for Pfizer series) 08/14/2020 (Originally 03/21/2020)  . TETANUS/TDAP  11/09/2022  . Hepatitis C Screening  Completed  . HIV Screening  Completed  . INFLUENZA VACCINE  Discontinued    Patient Care Team: Secilia Apps, Eula Fried, FNP as PCP - General (Family Medicine) Debbe Odea, MD as PCP - Cardiology (Cardiology) Lourena Simmonds, RPH-CPP (Pharmacist)  Review of Systems  Constitutional: Positive for fatigue. Negative for activity change, appetite change, chills, diaphoresis, fever and unexpected weight change.  HENT: Positive for congestion, rhinorrhea, sinus pressure and sneezing. Negative for dental problem, drooling, nosebleeds and trouble swallowing.   Eyes: Positive for redness and itching.  Respiratory: Negative.   Cardiovascular: Negative.   Gastrointestinal: Positive for  abdominal distention. Negative for abdominal pain, anal bleeding, blood in stool, constipation, diarrhea, nausea, rectal pain and vomiting.  Genitourinary: Negative.   Musculoskeletal: Positive for back pain. Negative for gait problem, myalgias and neck stiffness.  Skin: Negative.   Allergic/Immunologic: Positive for environmental allergies.  Neurological: Negative.   Hematological: Negative.   Psychiatric/Behavioral: The patient is nervous/anxious.   All other systems reviewed and are negative.   Last CBC Lab Results  Component Value Date   WBC 7.7 11/20/2019   HGB 12.7 11/20/2019   HCT 40.2 11/20/2019   MCV 80 11/20/2019   MCH 25.2 (L) 11/20/2019   RDW 16.0 (H) 11/20/2019   PLT 310 11/20/2019   Last metabolic panel Lab Results  Component Value Date   GLUCOSE 131 (H) 01/08/2020   NA 140 01/08/2020   K 4.3 01/08/2020   CL 105 01/08/2020   CO2 26 01/08/2020   BUN 13 01/08/2020   CREATININE 0.80 01/08/2020   GFRNONAA 92 01/08/2020   GFRAA 106 01/08/2020   CALCIUM 9.4 01/08/2020   PROT 7.0 11/20/2019   ALBUMIN 4.1 11/20/2019   LABGLOB 2.9 11/20/2019   AGRATIO 1.4 11/20/2019   BILITOT 0.3 11/20/2019   ALKPHOS 72 11/20/2019   AST 18 11/20/2019   ALT 18 11/20/2019   ANIONGAP 11 09/20/2017   Last lipids Lab Results  Component Value Date   CHOL 137 01/08/2020   HDL 54 01/08/2020   LDLCALC 67 01/08/2020   TRIG 80 01/08/2020   CHOLHDL 2.5 01/08/2020   Last hemoglobin A1c Lab Results  Component Value Date   HGBA1C 5.8 (H) 11/20/2019   Last thyroid functions Lab Results  Component Value Date   TSH 2.060 08/09/2019   T4TOTAL 7.1 08/03/2018   Last vitamin D Lab Results  Component Value Date   VD25OH 49.4 11/20/2019   Last vitamin B12 and Folate No results found for: VITAMINB12, FOLATE    Objective    BP (!) 154/95   Pulse 79   Temp 98.1 F (36.7 C) (Oral)   Resp 16   Ht 5\' 3"  (1.6 m)   Wt 289 lb 9.6 oz (131.4 kg)   LMP 06/01/2020 (Exact Date)    SpO2 100%  Breastfeeding No   BMI 51.30 kg/m  Physical Exam Vitals and nursing note reviewed.  Constitutional:      General: She is not in acute distress.    Appearance: Normal appearance. She is obese. She is not ill-appearing, toxic-appearing or diaphoretic.  HENT:     Head: Normocephalic and atraumatic.     Right Ear: External ear normal.     Left Ear: External ear normal.     Nose: Nose normal.     Mouth/Throat:     Mouth: Mucous membranes are moist.     Pharynx: No oropharyngeal exudate or posterior oropharyngeal erythema.  Eyes:     General: No scleral icterus.    Conjunctiva/sclera: Conjunctivae normal.     Pupils: Pupils are equal, round, and reactive to light.  Neck:     Vascular: No carotid bruit.  Cardiovascular:     Pulses: Normal pulses.     Heart sounds: Normal heart sounds.  Pulmonary:     Breath sounds: Normal breath sounds.  Abdominal:     General: There is no distension.     Palpations: Abdomen is soft.     Tenderness: There is no abdominal tenderness.  Musculoskeletal:        General: No tenderness. Normal range of motion.     Cervical back: Normal range of motion. No rigidity or tenderness.     Right lower leg: No edema.     Left lower leg: No edema.  Lymphadenopathy:     Cervical: No cervical adenopathy.  Skin:    General: Skin is warm.     Findings: No rash.  Neurological:     General: No focal deficit present.     Mental Status: She is alert and oriented to person, place, and time.     Motor: No weakness.     Gait: Gait normal.  Psychiatric:        Mood and Affect: Mood normal.        Behavior: Behavior normal.        Thought Content: Thought content normal.        Judgment: Judgment normal.      Depression Screen PHQ 2/9 Scores 06/17/2020 12/18/2019 09/17/2019 08/09/2019  PHQ - 2 Score - 0 0 2  PHQ- 9 Score - 2 3 7   Exception Documentation Patient refusal - - -   Results for orders placed or performed in visit on 06/17/20  POCT  urinalysis dipstick  Result Value Ref Range   Color, UA yellow    Clarity, UA clear    Glucose, UA Negative Negative   Bilirubin, UA negative    Ketones, UA negative    Spec Grav, UA 1.015 1.010 - 1.025   Blood, UA negative    pH, UA 5.0 5.0 - 8.0   Protein, UA Negative Negative   Urobilinogen, UA 0.2 0.2 or 1.0 E.U./dL   Nitrite, UA negative    Leukocytes, UA Negative Negative   Appearance     Odor      Assessment & Plan      1. Essential hypertension She is on medication, will continue as is for now. Just evaluated with cardiology and wants to try aggressive lifestyle changes.  - CBC with Differential/Platelet - Lipid panel  2. Screening for blood or protein in urine  - POCT urinalysis dipstick  3. History of uterine fibroid For PAP as well.  - Ambulatory referral to Obstetrics / Gynecology  4. Prediabetes Dietary/ lifestyle  changes.  - CBC  with Differential/Platelet - Lipid panel - TSH - Comprehensive metabolic panel - HgB A1c - B12  5. Vitamin D deficiency  - VITAMIN D 25 Hydroxy (Vit-D Deficiency, Fractures)   Orders Placed This Encounter  Procedures  . CBC with Differential/Platelet  . Lipid panel  . TSH  . Comprehensive metabolic panel  . HgB A1c  . B12  . VITAMIN D 25 Hydroxy (Vit-D Deficiency, Fractures)  . Ambulatory referral to Obstetrics / Gynecology  . POCT urinalysis dipstick    At next visit needs CPE, may want to try a different antidepressant. She will decrease dose of Zoloft until follow up in 3 weeks, to 1/2 tablet daily. Discussed withdrawal side effects and going back to regular dose if any symptom worsening. Discuss weight loss.   Red Flags discussed. The patient was given clear instructions to go to ER or return to medical center if any red flags develop, symptoms do not improve, worsen or new problems develop. They verbalized understanding.  Return in about 3 weeks (around 07/08/2020), or if symptoms worsen or fail to improve, for  at any time for any worsening symptoms, Go to Emergency room/ urgent care if worse.     The entirety of the information documented in the History of Present Illness, Review of Systems and Physical Exam were personally obtained by me. Portions of this information were initially documented by the CMA and reviewed by me for thoroughness and accuracy.    Jairo Ben, FNP  [facility] [phone] (phone) [phone] (fax)  [facility]

## 2020-06-17 NOTE — Patient Instructions (Addendum)
Decrease dose of  sertraline to 25 mg daily.     https://www.mata.com/.pdf">  DASH Eating Plan DASH stands for Dietary Approaches to Stop Hypertension. The DASH eating plan is a healthy eating plan that has been shown to:  Reduce high blood pressure (hypertension).  Reduce your risk for type 2 diabetes, heart disease, and stroke.  Help with weight loss. What are tips for following this plan? Reading food labels  Check food labels for the amount of salt (sodium) per serving. Choose foods with less than 5 percent of the Daily Value of sodium. Generally, foods with less than 300 milligrams (mg) of sodium per serving fit into this eating plan.  To find whole grains, look for the word "whole" as the first word in the ingredient list. Shopping  Buy products labeled as "low-sodium" or "no salt added."  Buy fresh foods. Avoid canned foods and pre-made or frozen meals. Cooking  Avoid adding salt when cooking. Use salt-free seasonings or herbs instead of table salt or sea salt. Check with your health care provider or pharmacist before using salt substitutes.  Do not fry foods. Cook foods using healthy methods such as baking, boiling, grilling, roasting, and broiling instead.  Cook with heart-healthy oils, such as olive, canola, avocado, soybean, or sunflower oil. Meal planning  Eat a balanced diet that includes: ? 4 or more servings of fruits and 4 or more servings of vegetables each day. Try to fill one-half of your plate with fruits and vegetables. ? 6-8 servings of whole grains each day. ? Less than 6 oz (170 g) of lean meat, poultry, or fish each day. A 3-oz (85-g) serving of meat is about the same size as a deck of cards. One egg equals 1 oz (28 g). ? 2-3 servings of low-fat dairy each day. One serving is 1 cup (237 mL). ? 1 serving of nuts, seeds, or beans 5 times each week. ? 2-3 servings of heart-healthy fats. Healthy fats called omega-3  fatty acids are found in foods such as walnuts, flaxseeds, fortified milks, and eggs. These fats are also found in cold-water fish, such as sardines, salmon, and mackerel.  Limit how much you eat of: ? Canned or prepackaged foods. ? Food that is high in trans fat, such as some fried foods. ? Food that is high in saturated fat, such as fatty meat. ? Desserts and other sweets, sugary drinks, and other foods with added sugar. ? Full-fat dairy products.  Do not salt foods before eating.  Do not eat more than 4 egg yolks a week.  Try to eat at least 2 vegetarian meals a week.  Eat more home-cooked food and less restaurant, buffet, and fast food.   Lifestyle  When eating at a restaurant, ask that your food be prepared with less salt or no salt, if possible.  If you drink alcohol: ? Limit how much you use to:  0-1 drink a day for women who are not pregnant.  0-2 drinks a day for men. ? Be aware of how much alcohol is in your drink. In the [place] one drink equals one 12 oz bottle of beer (355 mL), one 5 oz glass of wine (148 mL), or one 1 oz glass of hard liquor (44 mL). General information  Avoid eating more than 2,300 mg of salt a day. If you have hypertension, you may need to reduce your sodium intake to 1,500 mg a day.  Work with your health care provider to maintain a healthy  body weight or to lose weight. Ask what an ideal weight is for you.  Get at least 30 minutes of exercise that causes your heart to beat faster (aerobic exercise) most days of the week. Activities may include walking, swimming, or biking.  Work with your health care provider or dietitian to adjust your eating plan to your individual calorie needs. What foods should I eat? Fruits All fresh, dried, or frozen fruit. Canned fruit in natural juice (without added sugar). Vegetables Fresh or frozen vegetables (raw, steamed, roasted, or grilled). Low-sodium or reduced-sodium tomato and vegetable juice. Low-sodium or  reduced-sodium tomato sauce and tomato paste. Low-sodium or reduced-sodium canned vegetables. Grains Whole-grain or whole-wheat bread. Whole-grain or whole-wheat pasta. Brown rice. Orpah Cobb. Bulgur. Whole-grain and low-sodium cereals. Pita bread. Low-fat, low-sodium crackers. Whole-wheat flour tortillas. Meats and other proteins Skinless chicken or [place]. Ground chicken or [place]. Pork with fat trimmed off. Fish and seafood. Egg whites. Dried beans, peas, or lentils. Unsalted nuts, nut butters, and seeds. Unsalted canned beans. Lean cuts of beef with fat trimmed off. Low-sodium, lean precooked or cured meat, such as sausages or meat loaves. Dairy Low-fat (1%) or fat-free (skim) milk. Reduced-fat, low-fat, or fat-free cheeses. Nonfat, low-sodium ricotta or cottage cheese. Low-fat or nonfat yogurt. Low-fat, low-sodium cheese. Fats and oils Soft margarine without trans fats. Vegetable oil. Reduced-fat, low-fat, or light mayonnaise and salad dressings (reduced-sodium). Canola, safflower, olive, avocado, soybean, and sunflower oils. Avocado. Seasonings and condiments Herbs. Spices. Seasoning mixes without salt. Other foods Unsalted popcorn and pretzels. Fat-free sweets. The items listed above may not be a complete list of foods and beverages you can eat. Contact a dietitian for more information. What foods should I avoid? Fruits Canned fruit in a light or heavy syrup. Fried fruit. Fruit in cream or butter sauce. Vegetables Creamed or fried vegetables. Vegetables in a cheese sauce. Regular canned vegetables (not low-sodium or reduced-sodium). Regular canned tomato sauce and paste (not low-sodium or reduced-sodium). Regular tomato and vegetable juice (not low-sodium or reduced-sodium). Rosita Fire. Olives. Grains Baked goods made with fat, such as croissants, muffins, or some breads. Dry pasta or rice meal packs. Meats and other proteins Fatty cuts of meat. Ribs. Fried meat. Tomasa Blase. Bologna,  salami, and other precooked or cured meats, such as sausages or meat loaves. Fat from the back of a pig (fatback). Bratwurst. Salted nuts and seeds. Canned beans with added salt. Canned or smoked fish. Whole eggs or egg yolks. Chicken or [place] with skin. Dairy Whole or 2% milk, cream, and half-and-half. Whole or full-fat cream cheese. Whole-fat or sweetened yogurt. Full-fat cheese. Nondairy creamers. Whipped toppings. Processed cheese and cheese spreads. Fats and oils Butter. Stick margarine. Lard. Shortening. Ghee. Bacon fat. Tropical oils, such as coconut, palm kernel, or palm oil. Seasonings and condiments Onion salt, garlic salt, seasoned salt, table salt, and sea salt. Worcestershire sauce. Tartar sauce. Barbecue sauce. Teriyaki sauce. Soy sauce, including reduced-sodium. Steak sauce. Canned and packaged gravies. Fish sauce. Oyster sauce. Cocktail sauce. Store-bought horseradish. Ketchup. Mustard. Meat flavorings and tenderizers. Bouillon cubes. Hot sauces. Pre-made or packaged marinades. Pre-made or packaged taco seasonings. Relishes. Regular salad dressings. Other foods Salted popcorn and pretzels. The items listed above may not be a complete list of foods and beverages you should avoid. Contact a dietitian for more information. Where to find more information  National Heart, Lung, and Blood Institute: PopSteam.is  American Heart Association: www.heart.org  Academy of Nutrition and Dietetics: www.eatright.org  National Kidney Foundation: www.kidney.org Summary  The  DASH eating plan is a healthy eating plan that has been shown to reduce high blood pressure (hypertension). It may also reduce your risk for type 2 diabetes, heart disease, and stroke.  When on the DASH eating plan, aim to eat more fresh fruits and vegetables, whole grains, lean proteins, low-fat dairy, and heart-healthy fats.  With the DASH eating plan, you should limit salt (sodium) intake to 2,300 mg a day. If you  have hypertension, you may need to reduce your sodium intake to 1,500 mg a day.  Work with your health care provider or dietitian to adjust your eating plan to your individual calorie needs. This information is not intended to replace advice given to you by your health care provider. Make sure you discuss any questions you have with your health care provider. Document Revised: 03/22/2019 Document Reviewed: 03/22/2019 Elsevier Patient Education  2021 Elsevier Inc. Mediterranean Diet A Mediterranean diet refers to food and lifestyle choices that are based on the traditions of countries located on the [place]. This way of eating has been shown to help prevent certain conditions and improve outcomes for people who have chronic diseases, like kidney disease and heart disease. What are tips for following this plan? Lifestyle  Cook and eat meals together with your family, when possible.  Drink enough fluid to keep your urine clear or pale yellow.  Be physically active every day. This includes: ? Aerobic exercise like running or swimming. ? Leisure activities like gardening, walking, or housework.  Get 7-8 hours of sleep each night.  If recommended by your health care provider, drink red wine in moderation. This means 1 glass a day for nonpregnant women and 2 glasses a day for men. A glass of wine equals 5 oz (150 mL). Reading food labels  Check the serving size of packaged foods. For foods such as rice and pasta, the serving size refers to the amount of cooked product, not dry.  Check the total fat in packaged foods. Avoid foods that have saturated fat or trans fats.  Check the ingredients list for added sugars, such as corn syrup.   Shopping  At the grocery store, buy most of your food from the areas near the walls of the store. This includes: ? Fresh fruits and vegetables (produce). ? Grains, beans, nuts, and seeds. Some of these may be available in unpackaged forms or large  amounts (in bulk). ? Fresh seafood. ? Poultry and eggs. ? Low-fat dairy products.  Buy whole ingredients instead of prepackaged foods.  Buy fresh fruits and vegetables in-season from local farmers markets.  Buy frozen fruits and vegetables in resealable bags.  If you do not have access to quality fresh seafood, buy precooked frozen shrimp or canned fish, such as tuna, salmon, or sardines.  Buy small amounts of raw or cooked vegetables, salads, or olives from the deli or salad bar at your store.  Stock your pantry so you always have certain foods on hand, such as olive oil, canned tuna, canned tomatoes, rice, pasta, and beans. Cooking  Cook foods with extra-virgin olive oil instead of using butter or other vegetable oils.  Have meat as a side dish, and have vegetables or grains as your main dish. This means having meat in small portions or adding small amounts of meat to foods like pasta or stew.  Use beans or vegetables instead of meat in common dishes like chili or lasagna.  Experiment with different cooking methods. Try roasting or broiling vegetables instead of  steaming or sauteing them.  Add frozen vegetables to soups, stews, pasta, or rice.  Add nuts or seeds for added healthy fat at each meal. You can add these to yogurt, salads, or vegetable dishes.  Marinate fish or vegetables using olive oil, lemon juice, garlic, and fresh herbs. Meal planning  Plan to eat 1 vegetarian meal one day each week. Try to work up to 2 vegetarian meals, if possible.  Eat seafood 2 or more times a week.  Have healthy snacks readily available, such as: ? Vegetable sticks with hummus. ? [place] yogurt. ? Fruit and nut trail mix.  Eat balanced meals throughout the week. This includes: ? Fruit: 2-3 servings a day ? Vegetables: 4-5 servings a day ? Low-fat dairy: 2 servings a day ? Fish, poultry, or lean meat: 1 serving a day ? Beans and legumes: 2 or more servings a week ? Nuts and seeds:  1-2 servings a day ? Whole grains: 6-8 servings a day ? Extra-virgin olive oil: 3-4 servings a day  Limit red meat and sweets to only a few servings a month   What are my food choices?  Mediterranean diet ? Recommended  Grains: Whole-grain pasta. Brown rice. Bulgar wheat. Polenta. Couscous. Whole-wheat bread. Orpah Cobb.  Vegetables: Artichokes. Beets. Broccoli. Cabbage. Carrots. Eggplant. Green beans. Chard. Kale. Spinach. Onions. Leeks. Peas. Squash. Tomatoes. Peppers. Radishes.  Fruits: Apples. Apricots. Avocado. Berries. Bananas. Cherries. Dates. Figs. Grapes. Lemons. Melon. Oranges. Peaches. Plums. Pomegranate.  Meats and other protein foods: Beans. Almonds. Sunflower seeds. Pine nuts. Peanuts. Cod. Salmon. Scallops. Shrimp. Tuna. Tilapia. Clams. Oysters. Eggs.  Dairy: Low-fat milk. Cheese. Greek yogurt.  Beverages: Water. Red wine. Herbal tea.  Fats and oils: Extra virgin olive oil. Avocado oil. Grape seed oil.  Sweets and desserts: [place] yogurt with honey. Baked apples. Poached pears. Trail mix.  Seasoning and other foods: Basil. Cilantro. Coriander. Cumin. Mint. Parsley. Sage. Rosemary. Tarragon. Garlic. Oregano. Thyme. Pepper. Balsalmic vinegar. Tahini. Hummus. Tomato sauce. Olives. Mushrooms. ? Limit these  Grains: Prepackaged pasta or rice dishes. Prepackaged cereal with added sugar.  Vegetables: Deep fried potatoes (french fries).  Fruits: Fruit canned in syrup.  Meats and other protein foods: Beef. Pork. Lamb. Poultry with skin. Hot dogs. Tomasa Blase.  Dairy: Ice cream. Sour cream. Whole milk.  Beverages: Juice. Sugar-sweetened soft drinks. Beer. Liquor and spirits.  Fats and oils: Butter. Canola oil. Vegetable oil. Beef fat (tallow). Lard.  Sweets and desserts: Cookies. Cakes. Pies. Candy.  Seasoning and other foods: Mayonnaise. Premade sauces and marinades. The items listed may not be a complete list. Talk with your dietitian about what dietary choices are  right for you. Summary  The Mediterranean diet includes both food and lifestyle choices.  Eat a variety of fresh fruits and vegetables, beans, nuts, seeds, and whole grains.  Limit the amount of red meat and sweets that you eat.  Talk with your health care provider about whether it is safe for you to drink red wine in moderation. This means 1 glass a day for nonpregnant women and 2 glasses a day for men. A glass of wine equals 5 oz (150 mL). This information is not intended to replace advice given to you by your health care provider. Make sure you discuss any questions you have with your health care provider. Document Revised: 12/17/2015 Document Reviewed: 12/10/2015 Elsevier Patient Education  2020 [employer]. Health Maintenance, Female Adopting a healthy lifestyle and getting preventive care are important in promoting health and wellness. Ask your  health care provider about:  The right schedule for you to have regular tests and exams.  Things you can do on your own to prevent diseases and keep yourself healthy. What should I know about diet, weight, and exercise? Eat a healthy diet  Eat a diet that includes plenty of vegetables, fruits, low-fat dairy products, and lean protein.  Do not eat a lot of foods that are high in solid fats, added sugars, or sodium.   Maintain a healthy weight Body mass index (BMI) is used to identify weight problems. It estimates body fat based on height and weight. Your health care provider can help determine your BMI and help you achieve or maintain a healthy weight. Get regular exercise Get regular exercise. This is one of the most important things you can do for your health. Most adults should:  Exercise for at least 150 minutes each week. The exercise should increase your heart rate and make you sweat (moderate-intensity exercise).  Do strengthening exercises at least twice a week. This is in addition to the moderate-intensity exercise.  Spend  less time sitting. Even light physical activity can be beneficial. Watch cholesterol and blood lipids Have your blood tested for lipids and cholesterol at 44 years of age, then have this test every 5 years. Have your cholesterol levels checked more often if:  Your lipid or cholesterol levels are high.  You are older than 43 years of age.  You are at high risk for heart disease. What should I know about cancer screening? Depending on your health history and family history, you may need to have cancer screening at various ages. This may include screening for:  Breast cancer.  Cervical cancer.  Colorectal cancer.  Skin cancer.  Lung cancer. What should I know about heart disease, diabetes, and high blood pressure? Blood pressure and heart disease  High blood pressure causes heart disease and increases the risk of stroke. This is more likely to develop in people who have high blood pressure readings, are of African descent, or are overweight.  Have your blood pressure checked: ? Every 3-5 years if you are 2-1 years of age. ? Every year if you are 45 years old or older. Diabetes Have regular diabetes screenings. This checks your fasting blood sugar level. Have the screening done:  Once every three years after age 45 if you are at a normal weight and have a low risk for diabetes.  More often and at a younger age if you are overweight or have a high risk for diabetes. What should I know about preventing infection? Hepatitis B If you have a higher risk for hepatitis B, you should be screened for this virus. Talk with your health care provider to find out if you are at risk for hepatitis B infection. Hepatitis C Testing is recommended for:  Everyone born from [birth year] through 1963.  Anyone with known risk factors for hepatitis C. Sexually transmitted infections (STIs)  Get screened for STIs, including gonorrhea and chlamydia, if: ? You are sexually active and are younger than 43  years of age. ? You are older than 43 years of age and your health care provider tells you that you are at risk for this type of infection. ? Your sexual activity has changed since you were last screened, and you are at increased risk for chlamydia or gonorrhea. Ask your health care provider if you are at risk.  Ask your health care provider about whether you are at high risk for  HIV. Your health care provider may recommend a prescription medicine to help prevent HIV infection. If you choose to take medicine to prevent HIV, you should first get tested for HIV. You should then be tested every 3 months for as long as you are taking the medicine. Pregnancy  If you are about to stop having your period (premenopausal) and you may become pregnant, seek counseling before you get pregnant.  Take 400 to 800 micrograms (mcg) of folic acid every day if you become pregnant.  Ask for birth control (contraception) if you want to prevent pregnancy. Osteoporosis and menopause Osteoporosis is a disease in which the bones lose minerals and strength with aging. This can result in bone fractures. If you are 38 years old or older, or if you are at risk for osteoporosis and fractures, ask your health care provider if you should:  Be screened for bone loss.  Take a calcium or vitamin D supplement to lower your risk of fractures.  Be given hormone replacement therapy (HRT) to treat symptoms of menopause. Follow these instructions at home: Lifestyle  Do not use any products that contain nicotine or tobacco, such as cigarettes, e-cigarettes, and chewing tobacco. If you need help quitting, ask your health care provider.  Do not use street drugs.  Do not share needles.  Ask your health care provider for help if you need support or information about quitting drugs. Alcohol use  Do not drink alcohol if: ? Your health care provider tells you not to drink. ? You are pregnant, may be pregnant, or are planning to  become pregnant.  If you drink alcohol: ? Limit how much you use to 0-1 drink a day. ? Limit intake if you are breastfeeding.  Be aware of how much alcohol is in your drink. In the [place] one drink equals one 12 oz bottle of beer (355 mL), one 5 oz glass of wine (148 mL), or one 1 oz glass of hard liquor (44 mL). General instructions  Schedule regular health, dental, and eye exams.  Stay current with your vaccines.  Tell your health care provider if: ? You often feel depressed. ? You have ever been abused or do not feel safe at home. Summary  Adopting a healthy lifestyle and getting preventive care are important in promoting health and wellness.  Follow your health care provider's instructions about healthy diet, exercising, and getting tested or screened for diseases.  Follow your health care provider's instructions on monitoring your cholesterol and blood pressure. This information is not intended to replace advice given to you by your health care provider. Make sure you discuss any questions you have with your health care provider. Document Revised: 04/11/2018 Document Reviewed: 04/11/2018 Elsevier Patient Education  2021 [employer].

## 2020-06-17 NOTE — Progress Notes (Signed)
Urine within normal limits.

## 2020-06-18 ENCOUNTER — Encounter: Payer: Self-pay | Admitting: Adult Health

## 2020-06-18 DIAGNOSIS — Z86018 Personal history of other benign neoplasm: Secondary | ICD-10-CM

## 2020-06-18 HISTORY — DX: Personal history of other benign neoplasm: Z86.018

## 2020-06-23 ENCOUNTER — Telehealth: Payer: Self-pay

## 2020-06-23 ENCOUNTER — Encounter: Payer: Self-pay | Admitting: Adult Health

## 2020-06-23 NOTE — Telephone Encounter (Signed)
Copied from CRM [id number]. Topic: General - Other >> Jun 23, 2020 11:02 AM Erica Ramos wrote: Reason for CRM: Patient would like to be excused from the Flu vaccine because she is allergic to some of the ingredients in the vaccine.  Patient's employer is requesting a letter to prove this.  Please advise and call patient to discuss further.  CB# [phone]

## 2020-06-23 NOTE — Telephone Encounter (Signed)
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 Patient is requesting that you write a detail letter stating why she can not receive the influenza vaccine, she states " It cant just say exemption due to allergy". Patient is asking that we send letter through her mychart so she can email to [facility]. KW

## 2020-06-23 NOTE — Telephone Encounter (Signed)
Letter in chart

## 2020-06-25 ENCOUNTER — Encounter: Payer: Self-pay | Admitting: Adult Health

## 2020-06-25 NOTE — Telephone Encounter (Signed)
Noted we will discuss at office visit.

## 2020-06-25 NOTE — Telephone Encounter (Signed)
Insert given, please advise. KW

## 2020-06-25 NOTE — Telephone Encounter (Signed)
Pt calling again and is requesting to know if the flu shots in office contain neomycin. She states that she would prefer to have it done in office and is requesting to have a call back with the info ASAP. She states that she is needing to have this done by 06/29/20. Please advise.

## 2020-06-25 NOTE — Telephone Encounter (Signed)
Do we have package insert for the influenza we have in office - most have preservative with neomycin. I will need to look at ingredient list of what we have in office given she has had severe facial swelling in past.  She may benefit from allergist.

## 2020-06-25 NOTE — Telephone Encounter (Signed)
We carry Fluarix quadrivalent. It does contain eggs and gentamicin in small amounts.  Has she ever had a reaction to influenza vaccine in the past or any anaphylaxis ?  If no she can come for the influenza vaccine here in the office but will need to stay for 30 minutes after vaccination to be observed.

## 2020-06-25 NOTE — Telephone Encounter (Signed)
Patient was advised of message below and states that she has never had the vaccine, I scheduled patient on your schedule for 30 min appt 07/02/20. KW

## 2020-06-26 ENCOUNTER — Other Ambulatory Visit: Payer: Self-pay

## 2020-06-26 ENCOUNTER — Encounter: Payer: Self-pay | Admitting: Certified Nurse Midwife

## 2020-06-26 ENCOUNTER — Ambulatory Visit (INDEPENDENT_AMBULATORY_CARE_PROVIDER_SITE_OTHER): Payer: BC Managed Care – PPO | Admitting: Certified Nurse Midwife

## 2020-06-26 ENCOUNTER — Other Ambulatory Visit (HOSPITAL_COMMUNITY)
Admission: RE | Admit: 2020-06-26 | Discharge: 2020-06-26 | Disposition: A | Discharge status: Home / Self Care | Payer: BC Managed Care – PPO | Source: Ambulatory Visit | Attending: Certified Nurse Midwife | Admitting: Certified Nurse Midwife

## 2020-06-26 VITALS — BP 140/90 | Ht 63.0 in | Wt 284.0 lb

## 2020-06-26 DIAGNOSIS — Z01419 Encounter for gynecological examination (general) (routine) without abnormal findings: Secondary | ICD-10-CM | POA: Insufficient documentation

## 2020-06-26 DIAGNOSIS — N943 Premenstrual tension syndrome: Secondary | ICD-10-CM | POA: Diagnosis not present

## 2020-06-26 DIAGNOSIS — Z86018 Personal history of other benign neoplasm: Secondary | ICD-10-CM | POA: Diagnosis not present

## 2020-06-26 DIAGNOSIS — Z124 Encounter for screening for malignant neoplasm of cervix: Secondary | ICD-10-CM | POA: Diagnosis not present

## 2020-06-26 DIAGNOSIS — Z1231 Encounter for screening mammogram for malignant neoplasm of breast: Secondary | ICD-10-CM

## 2020-06-26 NOTE — Patient Instructions (Signed)
Premenstrual Syndrome Premenstrual syndrome (PMS) is a group of physical, emotional, and behavioral symptoms that affect women as part of their menstrual cycle. PMS occurs 1-2 weeks before the start of a woman's menstrual period and goes away a few days after menstrual bleeding begins. PMS can range from mild to severe. What are the causes? The exact cause of this condition is not known, but it seems to be related to hormone changes that happen before menstruation. What are the signs or symptoms? Symptoms of this condition often happen every month. They go away after your period starts. Physical symptoms of this condition include:  Bloating.  Breast pain or tenderness.  Headaches.  Extreme fatigue.  Backaches.  Swelling of the hands and feet.  Weight gain.  Hot flashes. Emotional symptoms of this condition include:  Mood swings.  Depression.  Angry or hostile outbursts.  Irritability.  Anxiety.  Crying spells. Behavioral symptoms include:  Food cravings or appetite changes.  Changes in sexual desire.  Confusion.  Social withdrawal.  Poor concentration. How is this diagnosed? This condition may be diagnosed based on a history of your symptoms. This condition is generally diagnosed if symptoms of PMS:  Are present in the 5 days before your period starts.  End within 4 days after your period starts.  Happen at least 3 months in a row.  Interfere with some of your normal activities. Other conditions that can cause some of these symptoms must be ruled out before PMS can be diagnosed. These include depression, anxiety, anemia, and thyroid problems. How is this treated? This condition may be treated by doing the following:  Maintaining a healthy lifestyle. This includes eating a well-balanced diet and exercising regularly.  Taking over-the-counter medicines that can help relieve symptoms, such as cramps, aches, pain, headaches, and breast tenderness. Follow  these instructions at home: Eating and drinking  Eat a well-balanced diet.  Avoid caffeine and alcohol.  Limit the amount of salt and salty foods you eat. This will help reduce bloating.  Drink enough fluid to keep your urine pale yellow.  Take a multivitamin if told to do so by your health care provider.   Lifestyle  Do not use any products that contain nicotine or tobacco. These products include cigarettes, chewing tobacco, and vaping devices, such as e-cigarettes. If you need help quitting, ask your health care provider.  Exercise regularly as suggested by your health care provider.  Get enough sleep. For most adults, this is 7-8 hours of sleep each night.  Practice relaxation techniques, such as yoga, tai chi, or meditation.  Find healthy ways to manage stress.   General instructions  For 2-3 months, write down your symptoms, whether they are mild to severe, and how long they last. This will help your health care provider choose the best treatment for you.  Take over-the-counter and prescription medicines only as told by your health care provider.  If you are using birth control pills (oral contraceptives), use them as told by your health care provider.   Contact a health care provider if:  Your symptoms get worse.  You develop new symptoms.  You have trouble doing your daily activities. Summary  Premenstrual syndrome (PMS) is a group of physical, emotional, and behavioral symptoms that affect women as part of their menstrual cycle.  PMS starts 1-2 weeks before the start of a woman's period and goes away a few days after the period starts.  PMS is treated by maintaining a healthy lifestyle and taking medicines  to relieve the symptoms. This information is not intended to replace advice given to you by your health care provider. Make sure you discuss any questions you have with your health care provider. Document Revised: 12/06/2019 Document Reviewed:  12/06/2019 Elsevier Patient Education  2021 Elsevier Inc.   Preventive Care 68-35 Years Old, Female Preventive care refers to lifestyle choices and visits with your health care provider that can promote health and wellness. This includes:  A yearly physical exam. This is also called an annual wellness visit.  Regular dental and eye exams.  Immunizations.  Screening for certain conditions.  Healthy lifestyle choices, such as: ? Eating a healthy diet. ? Getting regular exercise. ? Not using drugs or products that contain nicotine and tobacco. ? Limiting alcohol use. What can I expect for my preventive care visit? Physical exam Your health care provider will check your:  Height and weight. These may be used to calculate your BMI (body mass index). BMI is a measurement that tells if you are at a healthy weight.  Heart rate and blood pressure.  Body temperature.  Skin for abnormal spots. Counseling Your health care provider may ask you questions about your:  Past medical problems.  Family's medical history.  Alcohol, tobacco, and drug use.  Emotional well-being.  Home life and relationship well-being.  Sexual activity.  Diet, exercise, and sleep habits.  Work and work Statistician.  Access to firearms.  Method of birth control.  Menstrual cycle.  Pregnancy history. What immunizations do I need? Vaccines are usually given at various ages, according to a schedule. Your health care provider will recommend vaccines for you based on your age, medical history, and lifestyle or other factors, such as travel or where you work.   What tests do I need? Blood tests  Lipid and cholesterol levels. These may be checked every 5 years, or more often if you are over 48 years old.  Hepatitis C test.  Hepatitis B test. Screening  Lung cancer screening. You may have this screening every year starting at age 24 if you have a 30-pack-year history of smoking and currently  smoke or have quit within the past 15 years.  Colorectal cancer screening. ? All adults should have this screening starting at age 43 and continuing until age 55. ? Your health care provider may recommend screening at age 50 if you are at increased risk. ? You will have tests every 1-10 years, depending on your results and the type of screening test.  Diabetes screening. ? This is done by checking your blood sugar (glucose) after you have not eaten for a while (fasting). ? You may have this done every 1-3 years.  Mammogram. ? This may be done every 1-2 years. ? Talk with your health care provider about when you should start having regular mammograms. This may depend on whether you have a family history of breast cancer.  BRCA-related cancer screening. This may be done if you have a family history of breast, ovarian, tubal, or peritoneal cancers.  Pelvic exam and Pap test. ? This may be done every 3 years starting at age 44. ? Starting at age 33, this may be done every 5 years if you have a Pap test in combination with an HPV test. Other tests  STD (sexually transmitted disease) testing, if you are at risk.  Bone density scan. This is done to screen for osteoporosis. You may have this scan if you are at high risk for osteoporosis. Talk with your  health care provider about your test results, treatment options, and if necessary, the need for more tests. Follow these instructions at home: Eating and drinking  Eat a diet that includes fresh fruits and vegetables, whole grains, lean protein, and low-fat dairy products.  Take vitamin and mineral supplements as recommended by your health care provider.  Do not drink alcohol if: ? Your health care provider tells you not to drink. ? You are pregnant, may be pregnant, or are planning to become pregnant.  If you drink alcohol: ? Limit how much you have to 0-1 drink a day. ? Be aware of how much alcohol is in your drink. In the [place] one  drink equals one 12 oz bottle of beer (355 mL), one 5 oz glass of wine (148 mL), or one 1 oz glass of hard liquor (44 mL).   Lifestyle  Take daily care of your teeth and gums. Brush your teeth every morning and night with fluoride toothpaste. Floss one time each day.  Stay active. Exercise for at least 30 minutes 5 or more days each week.  Do not use any products that contain nicotine or tobacco, such as cigarettes, e-cigarettes, and chewing tobacco. If you need help quitting, ask your health care provider.  Do not use drugs.  If you are sexually active, practice safe sex. Use a condom or other form of protection to prevent STIs (sexually transmitted infections).  If you do not wish to become pregnant, use a form of birth control. If you plan to become pregnant, see your health care provider for a prepregnancy visit.  If told by your health care provider, take low-dose aspirin daily starting at age 12.  Find healthy ways to cope with stress, such as: ? Meditation, yoga, or listening to music. ? Journaling. ? Talking to a trusted person. ? Spending time with friends and family. Safety  Always wear your seat belt while driving or riding in a vehicle.  Do not drive: ? If you have been drinking alcohol. Do not ride with someone who has been drinking. ? When you are tired or distracted. ? While texting.  Wear a helmet and other protective equipment during sports activities.  If you have firearms in your house, make sure you follow all gun safety procedures. What's next?  Visit your health care provider once a year for an annual wellness visit.  Ask your health care provider how often you should have your eyes and teeth checked.  Stay up to date on all vaccines. This information is not intended to replace advice given to you by your health care provider. Make sure you discuss any questions you have with your health care provider. Document Revised: 01/21/2020 Document Reviewed:  12/28/2017 Elsevier Patient Education  2021 [employer].

## 2020-06-26 NOTE — Progress Notes (Signed)
ANNUAL PREVENTATIVE CARE GYN  ENCOUNTER NOTE  Subjective:       Erica Ramos is a 45 y.o. G69P2012 female here for a routine annual gynecologic exam.  Current complaints: 1. Mood changes and irritability associated with menses  Denies difficulty breathing or respiratory distress, chest pain, abdominal pain, excessive vaginal bleeding, dysuria, and leg pain.    Gynecologic History  Patient's last menstrual period was 06/01/2020 (exact date). Period Cycle (Days): 28 Period Duration (Days): 4-7 Period Pattern: Regular Menstrual Flow: Light,Moderate,Heavy Dysmenorrhea: (!) Mild Dysmenorrhea Symptoms: Cramping  Contraception: condoms  Last Pap: 03/2017. Results were: normal  Last mammogram: 12/2019. Results were: BI-RADS 1  Obstetric History  OB History  Gravida Para Term Preterm AB Living  3 2 2   1 2   SAB IAB Ectopic Multiple Live Births  1     0 2    # Outcome Date GA Lbr Len/2nd Weight Sex Delivery Anes PTL Lv  3 Term 09/20/17 [redacted]w[redacted]d  10 lb 5.4 oz (4.69 kg) M CS-LTranv Spinal  LIV  2 Term 06/05/16 [redacted]w[redacted]d  9 lb 1.3 oz (4.119 kg) M CS-LTranv EPI  LIV  1 SAB             Past Medical History:  Diagnosis Date  . Abscess of axilla, left 05/01/2013  . Allergy   . Anxiety   . Boil, breast 10/22/2019  . Bronchitis    chronic - has flare every December  . Cervical polyp    hx of  . Chest pain 05/01/2013  . Delivered by cesarean section 06/05/2016  . Depression, major, single episode, moderate (HCC) 04/19/2018  . Environmental allergies   . Genital warts   . GERD (gastroesophageal reflux disease)    Per MD chart note 12/01/2010  . Heart murmur   . Hx of abnormal Pap smear 2012  . Hypertension   . Morbid obesity (HCC)    BMI 47.9  . Persistent headaches    worst during menstrual cycle  . PVC (premature ventricular contraction) 02/20/2015  . STD (sexually transmitted disease)    hx genital warts/?HSV  . Vaginal bleeding 07/15/2018  . Vaginal Pap smear, abnormal      Past Surgical History:  Procedure Laterality Date  . CESAREAN SECTION N/A 06/05/2016   Procedure: CESAREAN SECTION;  Surgeon: 08/03/2016, MD;  Location: [facility];  Service: Obstetrics;  Laterality: N/A;  . CESAREAN SECTION N/A 09/20/2017   Procedure: CESAREAN SECTION;  Surgeon: 09/22/2017, MD;  Location: [facility];  Service: Obstetrics;  Laterality: N/A;  EDD 10/02/17  . COLPOSCOPY  2012   no treatment to cervix--pap smears reverted to normal  . WISDOM TOOTH EXTRACTION      Current Outpatient Medications on File Prior to Visit  Medication Sig Dispense Refill  . acetaminophen (TYLENOL) 500 MG tablet Take 500-1,000 mg by mouth daily as needed for headache.     . calcium carbonate (TUMS - DOSED IN MG ELEMENTAL CALCIUM) 500 MG chewable tablet Chew 3-4 tablets by mouth daily as needed for indigestion or heartburn.    . cetirizine (ZYRTEC) 10 MG tablet Take 1 tablet (10 mg total) by mouth daily. 90 tablet 3  . GARLIC PO Take 1 capsule by mouth daily.    . hydrochlorothiazide (HYDRODIURIL) 25 MG tablet Take 1 tablet (25 mg total) by mouth daily. 90 tablet 1  . losartan (COZAAR) 100 MG tablet Take 1 tablet (100 mg total) by mouth daily. 30 tablet 5  . metoprolol  succinate (TOPROL-XL) 100 MG 24 hr tablet Take 100 mg by mouth daily.    Marland Kitchen omeprazole (PRILOSEC) 20 MG capsule Take 1 capsule (20 mg total) by mouth daily. (Patient taking differently: Take 20 mg by mouth as needed.) 30 capsule 5  . Prenatal Vit-Fe Fumarate-FA (MULTIVITAMIN-PRENATAL) 27-0.8 MG TABS tablet Take 1 tablet by mouth daily.     . sertraline (ZOLOFT) 50 MG tablet Take 1 tablet (50 mg total) by mouth daily. Take 1/2 tab daily for 1 week and then go to 1 tab daily if tolerated. 90 tablet 3  . triamcinolone (NASACORT AQ) 55 MCG/ACT AERO nasal inhaler Place 2 sprays into the nose daily. (Patient taking differently: Place 1 spray into the nose daily.) 1 Inhaler 12  . triamcinolone cream (KENALOG) 0.1 % Apply 1  application topically 2 (two) times daily as needed. 30 g 0  . carvedilol (COREG) 25 MG tablet Take 1 tablet (25 mg total) by mouth 2 (two) times daily. 60 tablet 5   No current facility-administered medications on file prior to visit.    Allergies  Allergen Reactions  . Bacitracin-Neomycin-Polymyxin Swelling    Facial swelling with topical Neosporin  . Neosporin [Neomycin-Bacitracin Zn-Polymyx] Anaphylaxis  . Penicillins Nausea And Vomiting, Rash and Other (See Comments)    Has patient had a PCN reaction causing immediate rash, facial/tongue/throat swelling, SOB or lightheadedness with hypotension: Yes Has patient had a PCN reaction causing severe rash involving mucus membranes or skin necrosis: Yes Has patient had a PCN reaction that required hospitalization Yes Has patient had a PCN reaction occurring within the last 10 years: No If all of the above answers are "NO", then may proceed with Cephalosporin use.  fever Has patient had a PCN reaction causing immediate rash, facial/tongue/throat swelling, SOB or lightheadedness with hypotension: Yes Has patient had a PCN reaction causing severe rash involving mucus membranes or skin necrosis: Yes Has patient had a PCN reaction that required hospitalization Yes  . Strawberry Flavor     Social History   Socioeconomic History  . Marital status: Single    Spouse name: Not on file  . Number of children: 0  . Years of education: Not on file  . Highest education level: Not on file  Occupational History    Employer: [employer]  Tobacco Use  . Smoking status: Never Smoker  . Smokeless tobacco: Never Used  Vaping Use  . Vaping Use: Never used  Substance and Sexual Activity  . Alcohol use: Yes    Alcohol/week: 1.0 standard drink    Types: 1 Standard drinks or equivalent per week    Comment: Occasionally; less than 1-2 a week  . Drug use: No  . Sexual activity: Yes    Partners: Male    Birth control/protection: Condom     Comment: condoms sometimes  Other Topics Concern  . Not on file  Social History Narrative   Exercise--  2-3 x a week for about 30 min- 60 min   Social Determinants of Health   Financial Resource Strain: Low Risk   . Difficulty of Paying Living Expenses: Not hard at all  Food Insecurity: Not on file  Transportation Needs: Not on file  Physical Activity: Insufficiently Active  . Days of Exercise per Week: 2 days  . Minutes of Exercise per Session: 30 min  Stress: Not on file  Social Connections: Not on file  Intimate Partner Violence: Not on file    Family History  Problem Relation Age of Onset  .  Diabetes Mother   . Hypertension Mother        Under control  . Diabetes Maternal Aunt   . Seizures Maternal Aunt   . Diabetes Maternal Grandmother   . Hypertension Maternal Grandmother   . Heart Problems Maternal Grandmother   . Alcohol abuse Father   . Diabetes Father   . Hypertension Sister   . Diabetes Maternal Uncle   . Hypertension Other   . Obesity Other   . Heart attack Paternal Grandmother   . Heart Problems Paternal Grandmother   . COPD Neg Hx     The following portions of the patient's history were reviewed and updated as appropriate: allergies, current medications, past family history, past medical history, past social history, past surgical history and problem list.  Review of Systems  ROS negative except as noted above. Information obtained from patient.    Objective:   BP 140/90   Ht 5\' 3"  (1.6 m)   Wt 284 lb (128.8 kg)   LMP 06/01/2020 (Exact Date)   BMI 50.31 kg/m   CONSTITUTIONAL: Well-developed, well-nourished female in no acute distress.   PSYCHIATRIC: Normal mood and affect. Normal behavior. Normal judgment and thought content.  NEUROLGIC: Alert and oriented to person, place, and time. Normal muscle tone coordination. No cranial nerve deficit noted.  HENT:  Normocephalic, atraumatic.   EYES: Conjunctivae and EOM are normal.   NECK: Normal  range of motion, supple, no masses.  Normal thyroid.   SKIN: Skin is warm and dry. No rash noted. Not diaphoretic. No erythema. No pallor. Skin tattoos present.   CARDIOVASCULAR: Normal heart rate noted, regular rhythm, no murmur.  RESPIRATORY: Clear to auscultation bilaterally. Effort and breath sounds normal, no problems with respiration noted.  BREASTS: Symmetric in size. No masses, skin changes, nipple drainage, or lymphadenopathy.  ABDOMEN: Soft, normal bowel sounds, no distention noted.  No tenderness, rebound or guarding. Obese.   PELVIC:  External Genitalia: Normal  Vagina: Normal  Cervix: Normal, Pap collected  Uterus: Unable to assess due to habitus  Adnexa: Unable to assess due to habitus  MUSCULOSKELETAL: Normal range of motion. No tenderness.  No cyanosis, clubbing, or edema.  2+ distal pulses.  LYMPHATIC: No Axillary, Supraclavicular, or Inguinal Adenopathy.  Assessment:   Annual gynecologic examination 45 y.o.   Contraception: condoms   Obesity 3   Problem List Items Addressed This Visit      Other   History of uterine fibroid   Relevant Orders   FSH/LH   Estradiol   Progesterone   Testosterone, Free, Total, SHBG    Other Visit Diagnoses    Well woman exam    -  Primary   Relevant Orders   FSH/LH   Estradiol   Progesterone   Testosterone, Free, Total, SHBG   Cytology - PAP   Screening for cervical cancer       Relevant Orders   Cytology - PAP   PMS (premenstrual syndrome)       Relevant Orders   FSH/LH   Estradiol   Progesterone   Testosterone, Free, Total, SHBG      Plan:   Pap: Pap Co Test   Mammogram: Ordered  Labs: See orders  Routine preventative health maintenance measures emphasized: Exercise/Diet/Weight control, Tobacco Warnings, Alcohol/Substance use risks and Stress Management; see AVS  Herbal guide to management of PMS symptoms given  Reviewed red flag symptoms and when to call  RTC for pelvic ultrasound to follow up  on fibroids  Return to  Clinic - 1 Year for [facility] or sooner if needed   Serafina Royals, CNM  [facility], [facility] 06/26/20 1:20 PM

## 2020-06-27 LAB — TESTOSTERONE, FREE, TOTAL, SHBG
Sex Hormone Binding: 58.6 nmol/L (ref 24.6–122.0)
Testosterone, Free: 2.5 pg/mL (ref 0.0–4.2)
Testosterone: 17 ng/dL (ref 4–50)

## 2020-06-27 LAB — FSH/LH
FSH: 1.8 m[IU]/mL
LH: 3 m[IU]/mL

## 2020-06-27 LAB — ESTRADIOL: Estradiol: 116 pg/mL

## 2020-06-27 LAB — PROGESTERONE: Progesterone: 11.1 ng/mL

## 2020-07-01 LAB — CYTOLOGY - PAP
Comment: NEGATIVE
Diagnosis: NEGATIVE
Diagnosis: REACTIVE
High risk HPV: NEGATIVE

## 2020-07-02 ENCOUNTER — Other Ambulatory Visit: Payer: Self-pay

## 2020-07-02 ENCOUNTER — Ambulatory Visit (INDEPENDENT_AMBULATORY_CARE_PROVIDER_SITE_OTHER): Payer: BC Managed Care – PPO | Admitting: Adult Health

## 2020-07-02 DIAGNOSIS — Z23 Encounter for immunization: Secondary | ICD-10-CM

## 2020-07-02 NOTE — Patient Instructions (Signed)
Influenza (Flu) Vaccine (Inactivated or Recombinant): What You Need to Know 1. Why get vaccinated? Influenza vaccine can prevent influenza (flu). Flu is a contagious disease that spreads around the [place] every year, usually between October and May. Anyone can get the flu, but it is more dangerous for some people. Infants and young children, people 65 years and older, pregnant people, and people with certain health conditions or a weakened immune system are at greatest risk of flu complications. Pneumonia, bronchitis, sinus infections, and ear infections are examples of flu-related complications. If you have a medical condition, such as heart disease, cancer, or diabetes, flu can make it worse. Flu can cause fever and chills, sore throat, muscle aches, fatigue, cough, headache, and runny or stuffy nose. Some people may have vomiting and diarrhea, though this is more common in children than adults. In an average year, thousands of people in the [place] die from flu, and many more are hospitalized. Flu vaccine prevents millions of illnesses and flu-related visits to the doctor each year. 2. Influenza vaccines CDC recommends everyone 6 months and older get vaccinated every flu season. Children 6 months through 8 years of age may need 2 doses during a single flu season. Everyone else needs only 1 dose each flu season. It takes about 2 weeks for protection to develop after vaccination. There are many flu viruses, and they are always changing. Each year a new flu vaccine is made to protect against the influenza viruses believed to be likely to cause disease in the upcoming flu season. Even when the vaccine doesn't exactly match these viruses, it may still provide some protection. Influenza vaccine does not cause flu. Influenza vaccine may be given at the same time as other vaccines. 3. Talk with your health care provider Tell your vaccination provider if the person getting the vaccine:  Has  had an allergic reaction after a previous dose of influenza vaccine, or has any severe, life-threatening allergies  Has ever had Guillain-Barr Syndrome (also called "GBS") In some cases, your health care provider may decide to postpone influenza vaccination until a future visit. Influenza vaccine can be administered at any time during pregnancy. People who are or will be pregnant during influenza season should receive inactivated influenza vaccine. People with minor illnesses, such as a cold, may be vaccinated. People who are moderately or severely ill should usually wait until they recover before getting influenza vaccine. Your health care provider can give you more information. 4. Risks of a vaccine reaction  Soreness, redness, and swelling where the shot is given, fever, muscle aches, and headache can happen after influenza vaccination.  There may be a very small increased risk of Guillain-Barr Syndrome (GBS) after inactivated influenza vaccine (the flu shot). Young children who get the flu shot along with pneumococcal vaccine (PCV13) and/or DTaP vaccine at the same time might be slightly more likely to have a seizure caused by fever. Tell your health care provider if a child who is getting flu vaccine has ever had a seizure. People sometimes faint after medical procedures, including vaccination. Tell your provider if you feel dizzy or have vision changes or ringing in the ears. As with any medicine, there is a very remote chance of a vaccine causing a severe allergic reaction, other serious injury, or death. 5. What if there is a serious problem? An allergic reaction could occur after the vaccinated person leaves the clinic. If you see signs of a severe allergic reaction (hives, swelling of the face   and throat, difficulty breathing, a fast heartbeat, dizziness, or weakness), call 9-1-1 and get the person to the nearest hospital. For other signs that concern you, call your health care  provider. Adverse reactions should be reported to the Vaccine Adverse Event Reporting System (VAERS). Your health care provider will usually file this report, or you can do it yourself. Visit the VAERS website at www.vaers.LAgents.no or call 914-869-1756. VAERS is only for reporting reactions, and VAERS staff members do not give medical advice. 6. The National Vaccine Injury Compensation Program The [employer] Vaccine Injury Compensation Program (VICP) is a federal program that was created to compensate people who may have been injured by certain vaccines. Claims regarding alleged injury or death due to vaccination have a time limit for filing, which may be as short as two years. Visit the VICP website at SpiritualWord.at or call 360-164-8872 to learn about the program and about filing a claim. 7. How can I learn more?  Ask your health care provider.  Call your local or state health department.  Visit the website of the Food and Drug Administration (FDA) for vaccine package inserts and additional information at FinderList.no.  Contact the Centers for Disease Control and Prevention (CDC): ? Call (517)313-5455 (1-800-CDC-INFO) or ? Visit CDC's website at BiotechRoom.com.cy. Vaccine Information Statement Inactivated Influenza Vaccine (12/06/2019) This information is not intended to replace advice given to you by your health care provider. Make sure you discuss any questions you have with your health care provider. Document Revised: 01/23/2020 Document Reviewed: 01/23/2020 Elsevier Patient Education  2021 Elsevier Inc. Influenza, Adult Influenza is also called "the flu." It is an infection in the lungs, nose, and throat (respiratory tract). It spreads easily from person to person (is contagious). The flu causes symptoms that are like a cold, along with high fever and body aches. What are the causes? This condition is caused by the influenza virus. You can  get the virus by:  Breathing in droplets that are in the air after a person infected with the flu coughed or sneezed.  Touching something that has the virus on it and then touching your mouth, nose, or eyes. What increases the risk? Certain things may make you more likely to get the flu. These include:  Not washing your hands often.  Having close contact with many people during cold and flu season.  Touching your mouth, eyes, or nose without first washing your hands.  Not getting a flu shot every year. You may have a higher risk for the flu, and serious problems, such as a lung infection (pneumonia), if you:  Are older than 65.  Are pregnant.  Have a weakened disease-fighting system (immune system) because of a disease or because you are taking certain medicines.  Have a long-term (chronic) condition, such as: ? Heart, kidney, or lung disease. ? Diabetes. ? Asthma.  Have a liver disorder.  Are very overweight (morbidly obese).  Have anemia. What are the signs or symptoms? Symptoms usually begin suddenly and last 4-14 days. They may include:  Fever and chills.  Headaches, body aches, or muscle aches.  Sore throat.  Cough.  Runny or stuffy (congested) nose.  Feeling discomfort in your chest.  Not wanting to eat as much as normal.  Feeling weak or tired.  Feeling dizzy.  Feeling sick to your stomach or throwing up. How is this treated? If the flu is found early, you can be treated with antiviral medicine. This can help to reduce how bad the illness is  and how long it lasts. This may be given by mouth or through an IV tube. Taking care of yourself at home can help your symptoms get better. Your doctor may want you to:  Take over-the-counter medicines.  Drink plenty of fluids. The flu often goes away on its own. If you have very bad symptoms or other problems, you may be treated in a hospital. Follow these instructions at home: Activity  Rest as needed.  Get plenty of sleep.  Stay home from work or school as told by your doctor. ? Do not leave home until you do not have a fever for 24 hours without taking medicine. ? Leave home only to go to your doctor. Eating and drinking  Take an ORS (oral rehydration solution). This is a drink that is sold at pharmacies and stores.  Drink enough fluid to keep your pee pale yellow.  Drink clear fluids in small amounts as you are able. Clear fluids include: ? Water. ? Ice chips. ? Fruit juice mixed with water. ? Low-calorie sports drinks.  Eat bland foods that are easy to digest. Eat small amounts as you are able. These foods include: ? Bananas. ? Applesauce. ? Rice. ? Lean meats. ? Toast. ? Crackers.  Do not eat or drink: ? Fluids that have a lot of sugar or caffeine. ? Alcohol. ? Spicy or fatty foods. General instructions  Take over-the-counter and prescription medicines only as told by your doctor.  Use a cool mist humidifier to add moisture to the air in your home. This can make it easier for you to breathe. ? When using a cool mist humidifier, clean it daily. Empty water and replace with clean water.  Cover your mouth and nose when you cough or sneeze.  Wash your hands with soap and water often and for at least 20 seconds. This is also important after you cough or sneeze. If you cannot use soap and water, use alcohol-based hand sanitizer.  Keep all follow-up visits.      How is this prevented?  Get a flu shot every year. You may get the flu shot in late summer, fall, or winter. Ask your doctor when you should get your flu shot.  Avoid contact with people who are sick during fall and winter. This is cold and flu season.   Contact a doctor if:  You get new symptoms.  You have: ? Chest pain. ? Watery poop (diarrhea). ? A fever.  Your cough gets worse.  You start to have more mucus.  You feel sick to your stomach.  You throw up. Get help right away if you:  Have  shortness of breath.  Have trouble breathing.  Have skin or nails that turn a bluish color.  Have very bad pain or stiffness in your neck.  Get a sudden headache.  Get sudden pain in your face or ear.  Cannot eat or drink without throwing up. These symptoms may represent a serious problem that is an emergency. Get medical help right away. Call your local emergency services (911 in the [place]  Do not wait to see if the symptoms will go away.  Do not drive yourself to the hospital. Summary  Influenza is also called "the flu." It is an infection in the lungs, nose, and throat. It spreads easily from person to person.  Take over-the-counter and prescription medicines only as told by your doctor.  Getting a flu shot every year is the best way to not get the  flu. This information is not intended to replace advice given to you by your health care provider. Make sure you discuss any questions you have with your health care provider. Document Revised: 12/06/2019 Document Reviewed: 12/06/2019 Elsevier Patient Education  2021 [employer].

## 2020-07-02 NOTE — Progress Notes (Signed)
Nurse visit only was observed for 30 minutes in office after influenza vaccine, did well. Precautions were given and when to seek care immediately.

## 2020-07-07 ENCOUNTER — Encounter: Payer: BC Managed Care – PPO | Admitting: Adult Health

## 2020-07-13 ENCOUNTER — Ambulatory Visit (INDEPENDENT_AMBULATORY_CARE_PROVIDER_SITE_OTHER): Payer: Self-pay | Admitting: Cardiology

## 2020-07-13 ENCOUNTER — Other Ambulatory Visit: Payer: Self-pay

## 2020-07-13 ENCOUNTER — Encounter: Payer: Self-pay | Admitting: Cardiology

## 2020-07-13 VITALS — BP 160/90 | HR 70 | Ht 63.0 in | Wt 287.4 lb

## 2020-07-13 DIAGNOSIS — I10 Essential (primary) hypertension: Secondary | ICD-10-CM

## 2020-07-13 MED ORDER — AMLODIPINE BESYLATE 5 MG PO TABS: 5.0000 mg | ORAL_TABLET | Freq: Every day | ORAL | 5 refills | Status: DC

## 2020-07-13 NOTE — Progress Notes (Signed)
Cardiology Office Note:    Date:  07/13/2020   ID:  Erica Ramos, DOB [birth year], MRN [id number]  PCP:  Berniece Pap, FNP  [facility] Cardiologist:  Debbe Odea, MD  [facility] Electrophysiologist:  None   Referring MD: Theadore Nan, NP   Chief Complaint  Patient presents with  . 1 month follow up     "doing well." Medications reviewed by the patient verbally.     History of Present Illness:    Erica Ramos is a 45 y.o. female with a hx of hypertension, morbid obesity who presents for follow-up.    Patient being seen due to elevated blood pressures.  Blood pressures were elevated at last visit, additional BP meds/amlodipine was recommended but patient wanted to try low-salt diet and weight loss.  She is compliant with Coreg, HCTZ, losartan.  States eating healthier, trying to exercise as much as she can.  She does not check blood pressure frequently at home.  Denies chest pain, shortness of breath.  Started a new job full-time.  Health insurance is going to begin first of next month.    Past Medical History:  Diagnosis Date  . Abscess of axilla, left 05/01/2013  . Allergy   . Anxiety   . Boil, breast 10/22/2019  . Bronchitis    chronic - has flare every December  . Cervical polyp    hx of  . Chest pain 05/01/2013  . Delivered by cesarean section 06/05/2016  . Depression, major, single episode, moderate (HCC) 04/19/2018  . Environmental allergies   . Genital warts   . GERD (gastroesophageal reflux disease)    Per MD chart note 12/01/2010  . Heart murmur   . Hx of abnormal Pap smear 2012  . Hypertension   . Morbid obesity (HCC)    BMI 47.9  . Persistent headaches    worst during menstrual cycle  . PVC (premature ventricular contraction) 02/20/2015  . STD (sexually transmitted disease)    hx genital warts/?HSV  . Vaginal bleeding 07/15/2018  . Vaginal Pap smear, abnormal     Past Surgical History:  Procedure Laterality Date  .  CESAREAN SECTION N/A 06/05/2016   Procedure: CESAREAN SECTION;  Surgeon: Hoover Browns, MD;  Location: [facility];  Service: Obstetrics;  Laterality: N/A;  . CESAREAN SECTION N/A 09/20/2017   Procedure: CESAREAN SECTION;  Surgeon: Gerald Leitz, MD;  Location: [facility] BIRTHING SUITES;  Service: Obstetrics;  Laterality: N/A;  EDD 10/02/17  . COLPOSCOPY  2012   no treatment to cervix--pap smears reverted to normal  . WISDOM TOOTH EXTRACTION      Current Medications: Current Meds  Medication Sig  . acetaminophen (TYLENOL) 500 MG tablet Take 500-1,000 mg by mouth daily as needed for headache.   . calcium carbonate (TUMS - DOSED IN MG ELEMENTAL CALCIUM) 500 MG chewable tablet Chew 3-4 tablets by mouth daily as needed for indigestion or heartburn.  . carvedilol (COREG) 25 MG tablet Take 1 tablet (25 mg total) by mouth 2 (two) times daily.  . cetirizine (ZYRTEC) 10 MG tablet Take 1 tablet (10 mg total) by mouth daily.  Marland Kitchen GARLIC PO Take 1 capsule by mouth daily.  . hydrochlorothiazide (HYDRODIURIL) 25 MG tablet Take 1 tablet (25 mg total) by mouth daily.  Marland Kitchen losartan (COZAAR) 100 MG tablet Take 1 tablet (100 mg total) by mouth daily.  . metoprolol succinate (TOPROL-XL) 100 MG 24 hr tablet Take 100 mg by mouth daily.  Marland Kitchen omeprazole (PRILOSEC) 20  MG capsule Take 1 capsule (20 mg total) by mouth daily. (Patient taking differently: Take 20 mg by mouth as needed.)  . Prenatal Vit-Fe Fumarate-FA (MULTIVITAMIN-PRENATAL) 27-0.8 MG TABS tablet Take 1 tablet by mouth daily.   . sertraline (ZOLOFT) 50 MG tablet Take 1 tablet (50 mg total) by mouth daily. Take 1/2 tab daily for 1 week and then go to 1 tab daily if tolerated.  . triamcinolone (NASACORT AQ) 55 MCG/ACT AERO nasal inhaler Place 2 sprays into the nose daily. (Patient taking differently: Place 1 spray into the nose daily.)  . triamcinolone cream (KENALOG) 0.1 % Apply 1 application topically 2 (two) times daily as needed.  . [DISCONTINUED] amLODipine (NORVASC) 5  MG tablet Take 1 tablet (5 mg total) by mouth daily.     Allergies:   Bacitracin-neomycin-polymyxin, Neosporin [neomycin-bacitracin zn-polymyx], Penicillins, and Strawberry flavor   Social History   Socioeconomic History  . Marital status: Single    Spouse name: Not on file  . Number of children: 0  . Years of education: Not on file  . Highest education level: Not on file  Occupational History    Employer: [employer]  Tobacco Use  . Smoking status: Never Smoker  . Smokeless tobacco: Never Used  Vaping Use  . Vaping Use: Never used  Substance and Sexual Activity  . Alcohol use: Yes    Alcohol/week: 1.0 standard drink    Types: 1 Standard drinks or equivalent per week    Comment: Occasionally; less than 1-2 a week  . Drug use: No  . Sexual activity: Yes    Partners: Male    Birth control/protection: Condom    Comment: condoms sometimes  Other Topics Concern  . Not on file  Social History Narrative   Exercise--  2-3 x a week for about 30 min- 60 min   Social Determinants of Health   Financial Resource Strain: Low Risk   . Difficulty of Paying Living Expenses: Not hard at all  Food Insecurity: Not on file  Transportation Needs: Not on file  Physical Activity: Insufficiently Active  . Days of Exercise per Week: 2 days  . Minutes of Exercise per Session: 30 min  Stress: Not on file  Social Connections: Not on file     Family History: The patient's family history includes Alcohol abuse in her father; Diabetes in her father, maternal aunt, maternal grandmother, maternal uncle, and mother; Heart Problems in her maternal grandmother and paternal grandmother; Heart attack in her paternal grandmother; Hypertension in her maternal grandmother, mother, sister, and another family member; Obesity in an other family member; Seizures in her maternal aunt. There is no history of COPD.  ROS:   Please see the history of present illness.     All other systems reviewed and are  negative.  EKGs/Labs/Other Studies Reviewed:    The following studies were reviewed today:   EKG:  EKG not ordered today.   Recent Labs: 08/09/2019: TSH 2.060 11/20/2019: ALT 18; Hemoglobin 12.7; Platelets 310 01/08/2020: BUN 13; Creatinine, Ser 0.80; Potassium 4.3; Sodium 140  Recent Lipid Panel    Component Value Date/Time   CHOL 137 01/08/2020 0939   TRIG 80 01/08/2020 0939   HDL 54 01/08/2020 0939   CHOLHDL 2.5 01/08/2020 0939   CHOLHDL 3 03/15/2019 1135   VLDL 17.4 03/15/2019 1135   LDLCALC 67 01/08/2020 0939    Physical Exam:    VS:  BP (!) 160/90 (BP Location: Left Wrist, Patient Position: Sitting, Cuff Size: Normal)  Pulse 70   Ht 5\' 3"  (1.6 m)   Wt 287 lb 6 oz (130.4 kg)   SpO2 97%   BMI 50.91 kg/m     Wt Readings from Last 3 Encounters:  07/13/20 287 lb 6 oz (130.4 kg)  06/26/20 284 lb (128.8 kg)  06/17/20 289 lb 9.6 oz (131.4 kg)     GEN:  Well nourished, well developed in no acute distress HEENT: Normal NECK: No JVD; No carotid bruits LYMPHATICS: No lymphadenopathy CARDIAC: RRR, no murmurs, rubs, gallops RESPIRATORY:  Clear to auscultation without rales, wheezing or rhonchi  ABDOMEN: Soft, non-tender, distended MUSCULOSKELETAL:  No edema; No deformity  SKIN: Warm and dry NEUROLOGIC:  Alert and oriented x 3 PSYCHIATRIC:  Normal affect   ASSESSMENT:    1. Primary hypertension   2. Morbid obesity (HCC)    PLAN:    In order of problems listed above:  1. Hypertension, BP still elevated.  Start amlodipine 5 mg daily.  Continue losartan 100 mg daily, Coreg 25 mg twice daily, HCTZ 25 mg daily.  Low-salt diet advised, exercise advised. 2. Patient is morbidly obese, low-calorie diet, weight loss advised.  Follow-up in 3 months.    Medication Adjustments/Labs and Tests Ordered: Current medicines are reviewed at length with the patient today.  Concerns regarding medicines are outlined above.  No orders of the defined types were placed in this  encounter.  Meds ordered this encounter  Medications  . DISCONTD: amLODipine (NORVASC) 5 MG tablet    Sig: Take 1 tablet (5 mg total) by mouth daily.    Dispense:  30 tablet    Refill:  5  . DISCONTD: amLODipine (NORVASC) 5 MG tablet    Sig: Take 1 tablet (5 mg total) by mouth daily.    Dispense:  30 tablet    Refill:  5  . amLODipine (NORVASC) 5 MG tablet    Sig: Take 1 tablet (5 mg total) by mouth daily.    Dispense:  30 tablet    Refill:  5    Patient Instructions  Medication Instructions:   Your physician has recommended you make the following change in your medication:   1.  START taking Amlodipine (Norvasc) 5 MG daily.  *If you need a refill on your cardiac medications before your next appointment, please call your pharmacy*   Lab Work: None ordered If you have labs (blood work) drawn today and your tests are completely normal, you will receive your results only by: 06/19/20 MyChart Message (if you have MyChart) OR . A paper copy in the mail If you have any lab test that is abnormal or we need to change your treatment, we will call you to review the results.   Testing/Procedures: None ordered   Follow-Up: At [facility], you and your health needs are our priority.  As part of our continuing mission to provide you with exceptional heart care, we have created designated Provider Care Teams.  These Care Teams include your primary Cardiologist (physician) and Advanced Practice Providers (APPs -  Physician Assistants and Nurse Practitioners) who all work together to provide you with the care you need, when you need it.  We recommend signing up for the patient portal called "MyChart".  Sign up information is provided on this After Visit Summary.  MyChart is used to connect with patients for Virtual Visits (Telemedicine).  Patients are able to view lab/test results, encounter notes, upcoming appointments, etc.  Non-urgent messages can be sent to your provider as  well.   To  learn more about what you can do with MyChart, go to [email or web address].    Your next appointment:   3 month(s)  The format for your next appointment:   In Person  Provider:   Debbe OdeaBrian Agbor-Etang, MD   Other Instructions      Signed, Debbe OdeaBrian Agbor-Etang, MD  07/13/2020 11:18 AM    [facility]

## 2020-07-13 NOTE — Patient Instructions (Signed)
Medication Instructions:   Your physician has recommended you make the following change in your medication:   1.  START taking Amlodipine (Norvasc) 5 MG daily.  *If you need a refill on your cardiac medications before your next appointment, please call your pharmacy*   Lab Work: None ordered If you have labs (blood work) drawn today and your tests are completely normal, you will receive your results only by: Marland Kitchen MyChart Message (if you have MyChart) OR . A paper copy in the mail If you have any lab test that is abnormal or we need to change your treatment, we will call you to review the results.   Testing/Procedures: None ordered   Follow-Up: At [facility], you and your health needs are our priority.  As part of our continuing mission to provide you with exceptional heart care, we have created designated Provider Care Teams.  These Care Teams include your primary Cardiologist (physician) and Advanced Practice Providers (APPs -  Physician Assistants and Nurse Practitioners) who all work together to provide you with the care you need, when you need it.  We recommend signing up for the patient portal called "MyChart".  Sign up information is provided on this After Visit Summary.  MyChart is used to connect with patients for Virtual Visits (Telemedicine).  Patients are able to view lab/test results, encounter notes, upcoming appointments, etc.  Non-urgent messages can be sent to your provider as well.   To learn more about what you can do with MyChart, go to [email or web address].    Your next appointment:   3 month(s)  The format for your next appointment:   In Person  Provider:   Debbe Odea, MD   Other Instructions

## 2020-07-29 ENCOUNTER — Telehealth: Payer: Self-pay | Admitting: Cardiology

## 2020-07-29 MED ORDER — CARVEDILOL 25 MG PO TABS: 25.0000 mg | ORAL_TABLET | Freq: Two times a day (BID) | ORAL | 0 refills | Status: DC

## 2020-07-29 NOTE — Telephone Encounter (Signed)
Requested Prescriptions   Signed Prescriptions Disp Refills   carvedilol (COREG) 25 MG tablet 60 tablet 0    Sig: Take 1 tablet (25 mg total) by mouth 2 (two) times daily.    Authorizing Provider: Debbe Odea    Ordering User: Kendrick Fries

## 2020-07-29 NOTE — Telephone Encounter (Signed)
I spoke with pt and mentioned to her about using [facility] for 30 day until she is able to use her insurance. She was in agreement and mentioned that she would able to afford the 30 day with [facility] coupon. I have forwarded coupon to pt and sent in new Rx to [facility] in [place].

## 2020-07-29 NOTE — Telephone Encounter (Signed)
*  STAT* If patient is at the pharmacy, call can be transferred to refill team.   1. Which medications need to be refilled? (please list name of each medication and dose if known) carvedilol 25 MG 1 tablet 2 times daily   2. Which pharmacy/location (including street and city if local pharmacy) is medication to be sent to? [facility] on [address] and [address] [place]   3. Do they need a 30 day or 90 day supply? 90 day   Patient calling, states that she is out of medication and needs a refill but her insurance does not kick in til April 1st.  Would like to know what her options are.

## 2020-07-31 ENCOUNTER — Encounter: Payer: BC Managed Care – PPO | Admitting: Adult Health

## 2020-08-05 ENCOUNTER — Telehealth: Payer: Self-pay | Admitting: Cardiology

## 2020-08-05 NOTE — Telephone Encounter (Signed)
*  STAT* If patient is at the pharmacy, call can be transferred to refill team.   1. Which medications need to be refilled? (please list name of each medication and dose if known) all cardiac medications  2. Which pharmacy/location (including street and city if local pharmacy) is medication to be sent to? [facility] outpatient pharmacy in [place] phone [phone]) [phone]  3. Do they need a 30 day or 90 day supply? 90

## 2020-08-06 ENCOUNTER — Other Ambulatory Visit: Payer: Self-pay | Admitting: *Deleted

## 2020-08-06 DIAGNOSIS — I10 Essential (primary) hypertension: Secondary | ICD-10-CM

## 2020-08-06 MED ORDER — AMLODIPINE BESYLATE 5 MG PO TABS: 5.0000 mg | ORAL_TABLET | Freq: Every day | ORAL | 0 refills | Status: DC

## 2020-08-06 MED ORDER — HYDROCHLOROTHIAZIDE 25 MG PO TABS: 25.0000 mg | ORAL_TABLET | Freq: Every day | ORAL | 0 refills | Status: DC

## 2020-08-06 MED ORDER — METOPROLOL SUCCINATE ER 100 MG PO TB24: 100.0000 mg | ORAL_TABLET | Freq: Every day | ORAL | 0 refills | Status: DC

## 2020-08-06 MED ORDER — CARVEDILOL 25 MG PO TABS: 25.0000 mg | ORAL_TABLET | Freq: Two times a day (BID) | ORAL | 0 refills | Status: DC

## 2020-08-06 MED ORDER — LOSARTAN POTASSIUM 100 MG PO TABS: 100.0000 mg | ORAL_TABLET | Freq: Every day | ORAL | 0 refills | Status: DC

## 2020-08-06 NOTE — Telephone Encounter (Signed)
All cardiac meds sent to [facility] outpatient pharmacy.

## 2020-08-27 ENCOUNTER — Ambulatory Visit: Admission: RE | Admit: 2020-08-27 | Payer: BC Managed Care – PPO | Source: Ambulatory Visit

## 2020-08-27 ENCOUNTER — Other Ambulatory Visit: Payer: Self-pay

## 2020-09-13 IMAGING — MG DIGITAL SCREENING BILATERAL MAMMOGRAM WITH TOMO AND CAD
8 series · 8 of 24 positions shown · non-contrast
Comparison: Previous exam(s).

CLINICAL DATA: Screening.

EXAM:
DIGITAL SCREENING BILATERAL MAMMOGRAM WITH TOMO AND CAD

[R CC synth-2D]
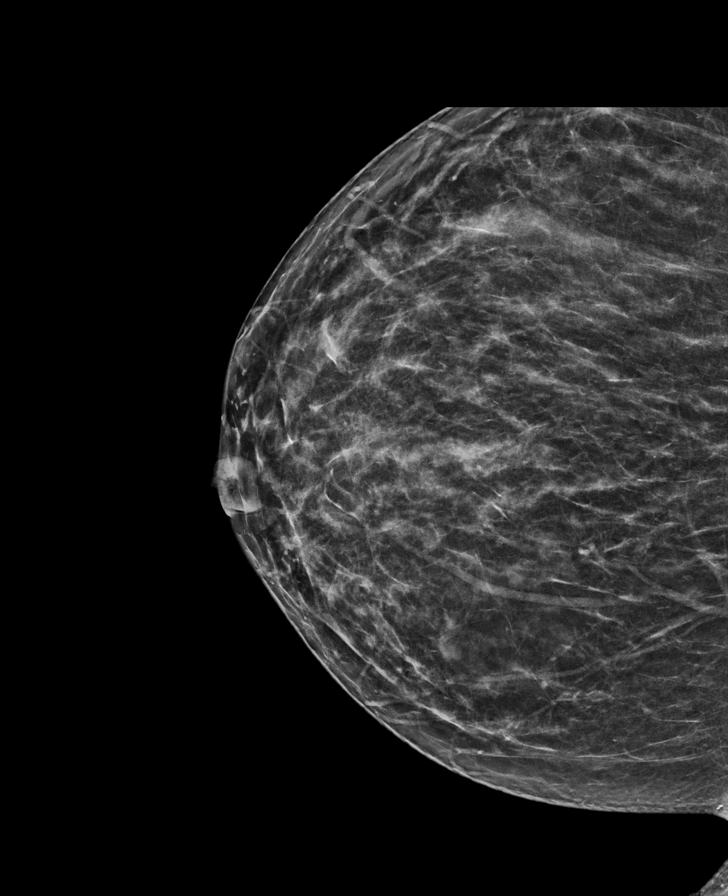

[R MLO synth-2D]
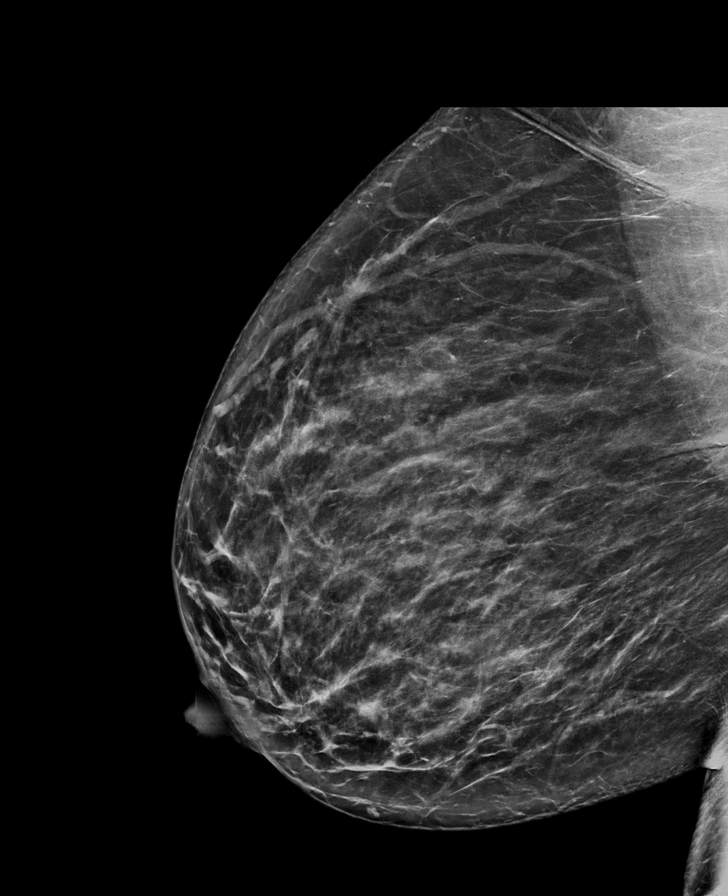

[L CC synth-2D]
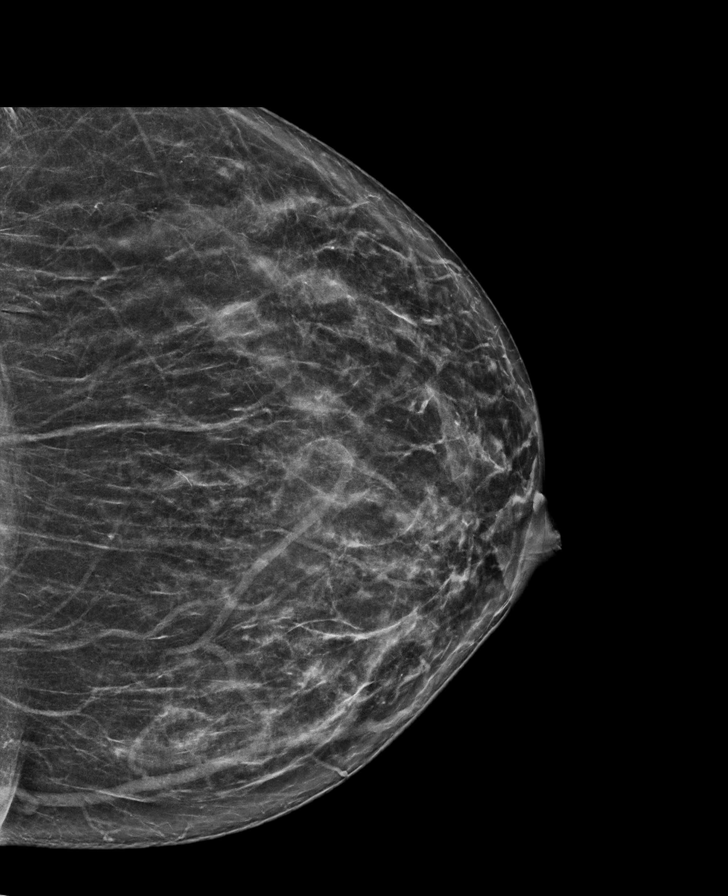

[L MLO synth-2D]
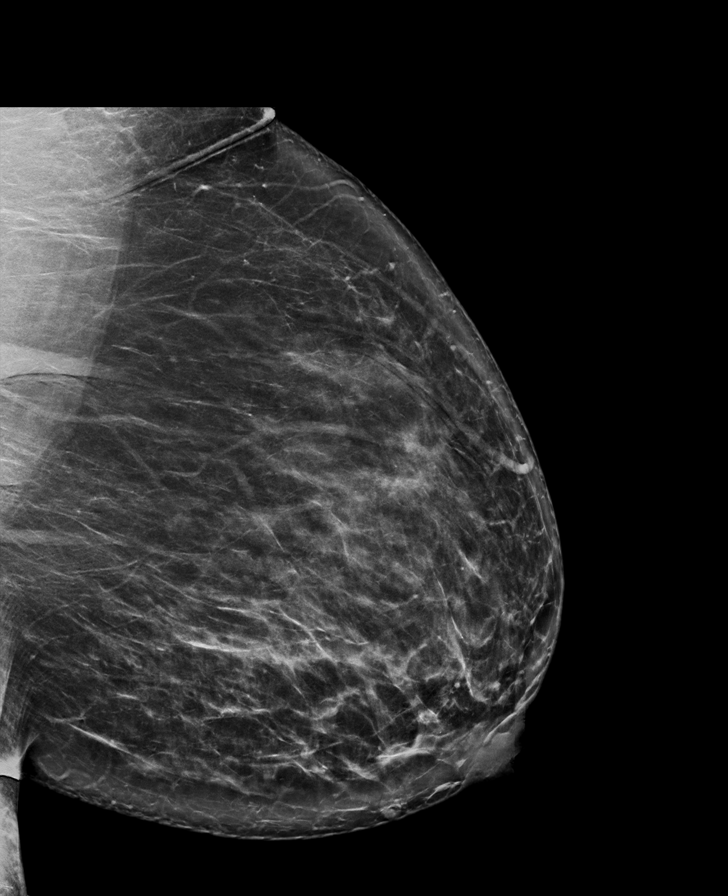

[R CC tomo · tomo slice 33/66.0]
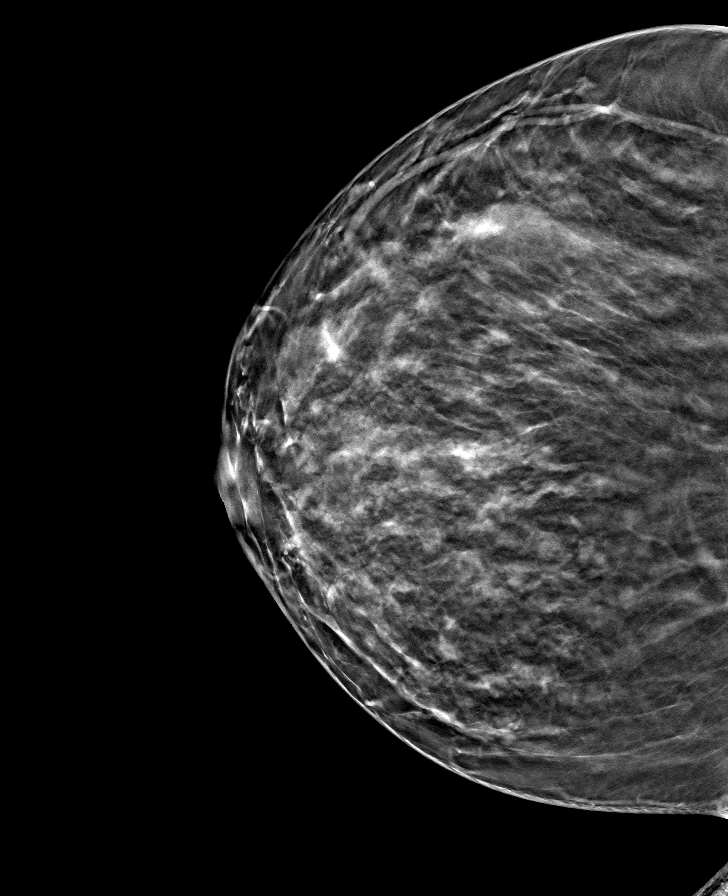

[L CC tomo · tomo slice 36/71.0]
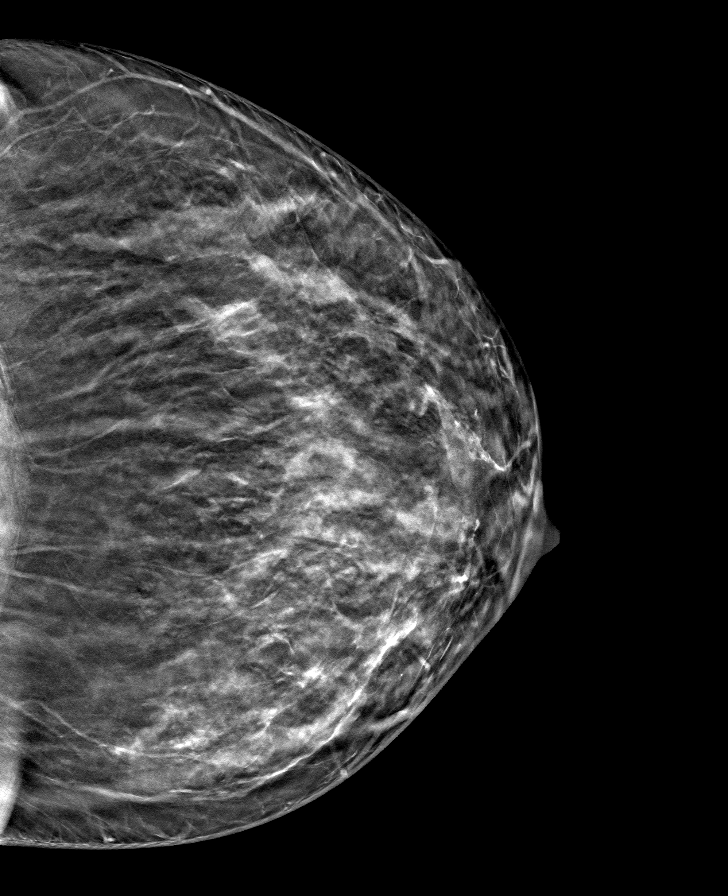

[L MLO tomo · tomo slice 45/88.0]
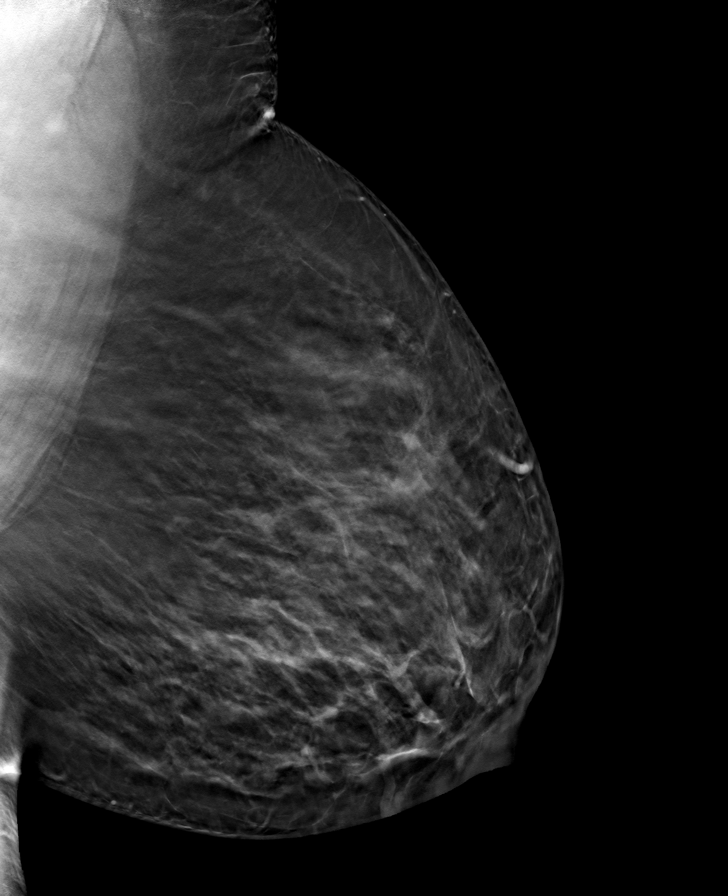

[R MLO tomo · tomo slice 41/80.0]
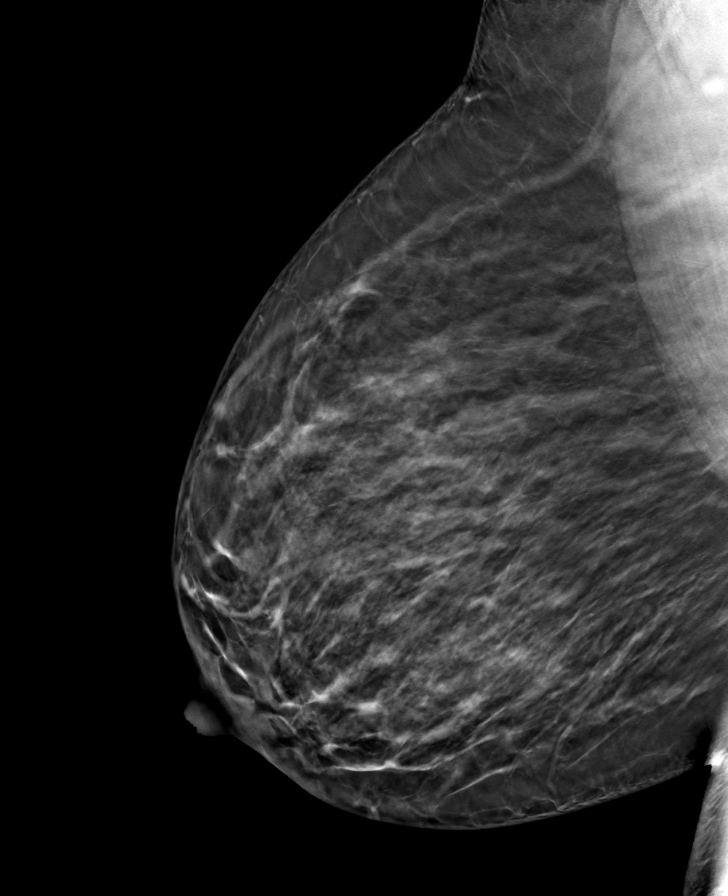

[8 of 24 positions shown; findings below may reference images not displayed]

ACR Breast Density Category c: The breast tissue is heterogeneously
dense, which may obscure small masses.
FINDINGS: There are no findings suspicious for malignancy. Images were
processed with CAD.
IMPRESSION: No mammographic evidence of malignancy. A result letter of this
screening mammogram will be mailed directly to the patient.

RECOMMENDATION:
Screening mammogram in one year. (Code:[id number])

BI-RADS CATEGORY  1: Negative.

## 2020-09-25 ENCOUNTER — Telehealth: Payer: Self-pay | Admitting: Adult Health

## 2020-09-25 NOTE — Telephone Encounter (Signed)
"  Rejection Reason - Patient Declined - Scheduled w/Dr. Everlene Farrier 03/25/20; no-showed this appt & declined rescheduling to another date" Erica Ramos said on Sep 23, 2020 2:19 PM  msg from [facility]

## 2020-09-25 NOTE — Telephone Encounter (Signed)
Noted  

## 2020-10-09 ENCOUNTER — Ambulatory Visit
Admission: RE | Admit: 2020-10-09 | Discharge: 2020-10-09 | Disposition: A | Discharge status: Home / Self Care | Payer: BC Managed Care – PPO | Source: Ambulatory Visit | Attending: Certified Nurse Midwife | Admitting: Certified Nurse Midwife

## 2020-10-09 ENCOUNTER — Other Ambulatory Visit: Payer: Self-pay

## 2020-10-09 DIAGNOSIS — Z01419 Encounter for gynecological examination (general) (routine) without abnormal findings: Secondary | ICD-10-CM | POA: Diagnosis not present

## 2020-10-09 DIAGNOSIS — Z86018 Personal history of other benign neoplasm: Secondary | ICD-10-CM | POA: Insufficient documentation

## 2020-10-16 ENCOUNTER — Other Ambulatory Visit: Payer: Self-pay

## 2020-10-16 ENCOUNTER — Encounter: Payer: Self-pay | Admitting: Certified Nurse Midwife

## 2020-10-16 ENCOUNTER — Ambulatory Visit: Payer: BC Managed Care – PPO | Admitting: Cardiology

## 2020-10-16 ENCOUNTER — Encounter: Payer: Self-pay | Admitting: Cardiology

## 2020-10-16 VITALS — BP 140/96 | HR 77 | Ht 63.0 in | Wt 298.0 lb

## 2020-10-16 DIAGNOSIS — R600 Localized edema: Secondary | ICD-10-CM

## 2020-10-16 DIAGNOSIS — I10 Essential (primary) hypertension: Secondary | ICD-10-CM

## 2020-10-16 DIAGNOSIS — N83201 Unspecified ovarian cyst, right side: Secondary | ICD-10-CM | POA: Insufficient documentation

## 2020-10-16 MED ORDER — FUROSEMIDE 20 MG PO TABS: 20.0000 mg | ORAL_TABLET | Freq: Every day | ORAL | 3 refills | Status: DC | PRN

## 2020-10-16 NOTE — Patient Instructions (Signed)
Medication Instructions:    START taking Lasix 20 MG once a day only when needed for swelling.  *If you need a refill on your cardiac medications before your next appointment, please call your pharmacy*   Lab Work: None ordered If you have labs (blood work) drawn today and your tests are completely normal, you will receive your results only by: MyChart Message (if you have MyChart) OR A paper copy in the mail If you have any lab test that is abnormal or we need to change your treatment, we will call you to review the results.   Testing/Procedures: None ordered   Follow-Up: At [facility], you and your health needs are our priority.  As part of our continuing mission to provide you with exceptional heart care, we have created designated Provider Care Teams.  These Care Teams include your primary Cardiologist (physician) and Advanced Practice Providers (APPs -  Physician Assistants and Nurse Practitioners) who all work together to provide you with the care you need, when you need it.  We recommend signing up for the patient portal called "MyChart".  Sign up information is provided on this After Visit Summary.  MyChart is used to connect with patients for Virtual Visits (Telemedicine).  Patients are able to view lab/test results, encounter notes, upcoming appointments, etc.  Non-urgent messages can be sent to your provider as well.   To learn more about what you can do with MyChart, go to [email or web address].    Your next appointment:   6 month(s)  The format for your next appointment:   In Person  Provider:   You may see Debbe Odea, MD or one of the following Advanced Practice Providers on your designated Care Team:   Nicolasa Ducking, NP Eula Listen, PA-C Marisue Ivan, PA-C Cadence [place], [place] Gillian Shields, NP   Other Instructions

## 2020-10-16 NOTE — Progress Notes (Signed)
Cardiology Office Note:    Date:  10/16/2020   ID:  Erica Ramos, DOB 1975/09/19, MRN [id number]  PCP:  Miki Kins, FNP  [facility] Cardiologist:  Debbe Odea, MD  [facility] Electrophysiologist:  None   Referring MD: Stephanie Acre*   Chief Complaint  Patient presents with   Other    3 month follow up. Meds reviewed verbally with paitent.     History of Present Illness:    Erica Ramos is a 45 y.o. female with a hx of hypertension, morbid obesity who presents for follow-up.    Patient being seen due to elevated blood pressures.  Started on amlodipine after last visit.  Compliant with Coreg, losartan, HCTZ.  Tolerating current medications as prescribed.  Blood pressure has improved, running around 120s to 130s systolic.  She states having some lower extremity edema since starting amlodipine.  Edema worsens as the day progresses.  She is working on eating a healthier diet, exercising frequently to achieve weight loss.      Past Medical History:  Diagnosis Date   Abscess of axilla, left 05/01/2013   Allergy    Anxiety    Boil, breast 10/22/2019   Bronchitis    chronic - has flare every December   Cervical polyp    hx of   Chest pain 05/01/2013   Delivered by cesarean section 06/05/2016   Depression, major, single episode, moderate (HCC) 04/19/2018   Environmental allergies    Genital warts    GERD (gastroesophageal reflux disease)    Per MD chart note 12/01/2010   Heart murmur    Hx of abnormal Pap smear 2012   Hypertension    Morbid obesity (HCC)    BMI 47.9   Persistent headaches    worst during menstrual cycle   PVC (premature ventricular contraction) 02/20/2015   STD (sexually transmitted disease)    hx genital warts/?HSV   Vaginal bleeding 07/15/2018   Vaginal Pap smear, abnormal     Past Surgical History:  Procedure Laterality Date   CESAREAN SECTION N/A 06/05/2016   Procedure: CESAREAN SECTION;  Surgeon: Hoover Browns, MD;   Location: [facility]ING SU[facility];  Service: Obstetrics;  Laterality: N/A;   CESAREAN SECTION N/A 09/20/2017   Procedure: CESAREAN SECTION;  Surgeon: Gerald Leitz, MD;  Location: [facility]THING SUITES;  Service: Obstetrics;  Laterality: N/A;  EDD 10/02/17   COLPOSCOPY  2012   no treatment to cervix--pap smears reverted to normal   WISDOM TOOTH EXTRACTION      Current Medications: Current Meds  Medication Sig   acetaminophen (TYLENOL) 500 MG tablet Take 500-1,000 mg by mouth daily as needed for headache.    amLODipine (NORVASC) 5 MG tablet Take 1 tablet (5 mg total) by mouth daily.   calcium carbonate (TUMS - DOSED IN MG ELEMENTAL CALCIUM) 500 MG chewable tablet Chew 3-4 tablets by mouth daily as needed for indigestion or heartburn.   carvedilol (COREG) 25 MG tablet Take 1 tablet (25 mg total) by mouth 2 (two) times daily.   cetirizine (ZYRTEC) 10 MG tablet Take 1 tablet (10 mg total) by mouth daily.   furosemide (LASIX) 20 MG tablet Take 1 tablet (20 mg total) by mouth daily as needed.   GARLIC PO Take 1 capsule by mouth daily.   hydrochlorothiazide (HYDRODIURIL) 25 MG tablet Take 1 tablet (25 mg total) by mouth daily.   losartan (COZAAR) 100 MG tablet Take 1 tablet (100 mg total) by mouth daily.  omeprazole (PRILOSEC) 20 MG capsule Take 1 capsule (20 mg total) by mouth daily.   OZEMPIC, 0.25 OR 0.5 MG/DOSE, 2 MG/1.5ML SOPN Inject 0.5 mg into the skin once a week.   Prenatal Vit-Fe Fumarate-FA (MULTIVITAMIN-PRENATAL) 27-0.8 MG TABS tablet Take 1 tablet by mouth daily.    sertraline (ZOLOFT) 50 MG tablet Take 1 tablet (50 mg total) by mouth daily. Take 1/2 tab daily for 1 week and then go to 1 tab daily if tolerated.   triamcinolone (NASACORT AQ) 55 MCG/ACT AERO nasal inhaler Place 2 sprays into the nose daily.   triamcinolone cream (KENALOG) 0.1 % Apply 1 application topically 2 (two) times daily as needed.   [DISCONTINUED] metoprolol succinate (TOPROL-XL) 100 MG 24 hr tablet Take 1 tablet (100 mg  total) by mouth daily.     Allergies:   Bacitracin-neomycin-polymyxin, Neosporin [neomycin-bacitracin zn-polymyx], Penicillins, and Strawberry flavor   Social History   Socioeconomic History   Marital status: Single    Spouse name: Not on file   Number of children: 0   Years of education: Not on file   Highest education level: Not on file  Occupational History    Employer: [employer]  Tobacco Use   Smoking status: Never   Smokeless tobacco: Never  Vaping Use   Vaping Use: Never used  Substance and Sexual Activity   Alcohol use: Yes    Alcohol/week: 1.0 standard drink    Types: 1 Standard drinks or equivalent per week    Comment: Occasionally; less than 1-2 a week   Drug use: No   Sexual activity: Yes    Partners: Male    Birth control/protection: Condom    Comment: condoms sometimes  Other Topics Concern   Not on file  Social History Narrative   Exercise--  2-3 x a week for about 30 min- 60 min   Social Determinants of Corporate investment banker Strain: Low Risk    Difficulty of Paying Living Expenses: Not hard at all  Food Insecurity: Not on file  Transportation Needs: Not on file  Physical Activity: Insufficiently Active   Days of Exercise per Week: 2 days   Minutes of Exercise per Session: 30 min  Stress: Not on file  Social Connections: Not on file     Family History: The patient's family history includes Alcohol abuse in her father; Diabetes in her father, maternal aunt, maternal grandmother, maternal uncle, and mother; Heart Problems in her maternal grandmother and paternal grandmother; Heart attack in her paternal grandmother; Hypertension in her maternal grandmother, mother, sister, and another family member; Obesity in an other family member; Seizures in her maternal aunt. There is no history of COPD.  ROS:   Please see the history of present illness.     All other systems reviewed and are negative.  EKGs/Labs/Other Studies Reviewed:    The  following studies were reviewed today:   EKG:  EKG is ordered today.  EKG shows sinus rhythm, PACs.  Recent Labs: 11/20/2019: ALT 18; Hemoglobin 12.7; Platelets 310 01/08/2020: BUN 13; Creatinine, Ser 0.80; Potassium 4.3; Sodium 140  Recent Lipid Panel    Component Value Date/Time   CHOL 137 01/08/2020 0939   TRIG 80 01/08/2020 0939   HDL 54 01/08/2020 0939   CHOLHDL 2.5 01/08/2020 0939   CHOLHDL 3 03/15/2019 1135   VLDL 17.4 03/15/2019 1135   LDLCALC 67 01/08/2020 0939    Physical Exam:    VS:  BP (!) 140/96 (BP Location: Left Arm, Patient  Position: Sitting, Cuff Size: Large)   Pulse 77   Ht 5\' 3"  (1.6 m)   Wt 298 lb (135.2 kg)   SpO2 98%   BMI 52.79 kg/m     Wt Readings from Last 3 Encounters:  10/16/20 298 lb (135.2 kg)  07/13/20 287 lb 6 oz (130.4 kg)  06/26/20 284 lb (128.8 kg)     GEN:  Well nourished, well developed in no acute distress HEENT: Normal NECK: No JVD; No carotid bruits LYMPHATICS: No lymphadenopathy CARDIAC: RRR, no murmurs, rubs, gallops RESPIRATORY:  Clear to auscultation without rales, wheezing or rhonchi  ABDOMEN: Soft, non-tender, distended MUSCULOSKELETAL:  1+ edema; No deformity  SKIN: Warm and dry NEUROLOGIC:  Alert and oriented x 3 PSYCHIATRIC:  Normal affect   ASSESSMENT:    1. Primary hypertension   2. Morbid obesity (HCC)   3. Bilateral leg edema     PLAN:    In order of problems listed above:  Hypertension, BP still elevated but improved from prior.  BP actually controlled at home.  Continue amlodipine, losartan 100 mg daily, Coreg 25 mg twice daily, HCTZ 25 mg daily.  Low-salt diet advised, exercise advised. Patient is morbidly obese, low-calorie diet, weight loss advised. Bilateral lower extremity edema which is dependent, likely secondary to obesity.  Previous echo with normal function.  Advised compression stockings, leg raise maneuvers.  Start Lasix 20 mg daily as needed edema.  Follow-up in 6 months.    Medication  Adjustments/Labs and Tests Ordered: Current medicines are reviewed at length with the patient today.  Concerns regarding medicines are outlined above.  Orders Placed This Encounter  Procedures   EKG 12-Lead    Meds ordered this encounter  Medications   furosemide (LASIX) 20 MG tablet    Sig: Take 1 tablet (20 mg total) by mouth daily as needed.    Dispense:  90 tablet    Refill:  3     Patient Instructions  Medication Instructions:    START taking Lasix 20 MG once a day only when needed for swelling.  *If you need a refill on your cardiac medications before your next appointment, please call your pharmacy*   Lab Work: None ordered If you have labs (blood work) drawn today and your tests are completely normal, you will receive your results only by: MyChart Message (if you have MyChart) OR A paper copy in the mail If you have any lab test that is abnormal or we need to change your treatment, we will call you to review the results.   Testing/Procedures: None ordered   Follow-Up: [facility], you and your health needs are our priority.  As part of our continuing mission to provide you with exceptional heart care, we have created designated Provider Care Teams.  These Care Teams include your primary Cardiologist (physician) and Advanced Practice Providers (APPs -  Physician Assistants and Nurse Practitioners) who all work together to provide you with the care you need, when you need it.  We recommend signing up for the patient portal called "MyChart".  Sign up information is provided on this After Visit Summary.  MyChart is used to connect with patients for Virtual Visits (Telemedicine).  Patients are able to view lab/test results, encounter notes, upcoming appointments, etc.  Non-urgent messages can be sent to your provider as well.   To learn more about what you can do with MyChart, go to [email or web address].    Your next appointment:   6 month(s)  The format  for  your next appointment:   In Person  Provider:   You may see Debbe Odea, MD or one of the following Advanced Practice Providers on your designated Care Team:   Nicolasa Ducking, NP Eula Listen, PA-C Marisue Ivan, PA-C Cadence [place], [place] Gillian Shields, NP   Other Instructions    Signed, Debbe Odea, MD  10/16/2020 11:27 AM    [facility]

## 2020-10-18 ENCOUNTER — Other Ambulatory Visit: Payer: Self-pay | Admitting: Certified Nurse Midwife

## 2020-10-18 DIAGNOSIS — Z86018 Personal history of other benign neoplasm: Secondary | ICD-10-CM

## 2020-10-18 DIAGNOSIS — N83201 Unspecified ovarian cyst, right side: Secondary | ICD-10-CM

## 2020-10-18 NOTE — Progress Notes (Signed)
Follow up ultrasound ordered, see chart.    Erica Ramos, CNM [facility] 10/18/20 8:28 PM

## 2020-10-19 NOTE — Progress Notes (Signed)
Pt has a new PCP listed on 10/09/20. New PCP is Grayling Congress, FNP.

## 2020-11-06 ENCOUNTER — Telehealth: Payer: Self-pay | Admitting: Cardiology

## 2020-11-06 DIAGNOSIS — I10 Essential (primary) hypertension: Secondary | ICD-10-CM

## 2020-11-06 MED ORDER — LOSARTAN POTASSIUM 100 MG PO TABS: 100.0000 mg | ORAL_TABLET | Freq: Every day | ORAL | 1 refills | Status: DC

## 2020-11-06 MED ORDER — CARVEDILOL 25 MG PO TABS: 25.0000 mg | ORAL_TABLET | Freq: Two times a day (BID) | ORAL | 1 refills | Status: DC

## 2020-11-06 MED ORDER — HYDROCHLOROTHIAZIDE 25 MG PO TABS
25.0000 mg | ORAL_TABLET | Freq: Every day | ORAL | 1 refills | Status: DC
Start: 2020-11-06 — End: 2021-05-04

## 2020-11-06 MED ORDER — AMLODIPINE BESYLATE 5 MG PO TABS: 5.0000 mg | ORAL_TABLET | Freq: Every day | ORAL | 1 refills | Status: DC

## 2020-11-06 NOTE — Telephone Encounter (Signed)
Requested Prescriptions   Signed Prescriptions Disp Refills   carvedilol (COREG) 25 MG tablet 180 tablet 1    Sig: Take 1 tablet (25 mg total) by mouth 2 (two) times daily.    Authorizing Provider: Debbe Odea    Ordering User: Thayer Headings, Subrina Vecchiarelli L   losartan (COZAAR) 100 MG tablet 90 tablet 1    Sig: Take 1 tablet (100 mg total) by mouth daily.    Authorizing Provider: Debbe Odea    Ordering User: NEWCOMER MCCLAIN, Tamar Lipscomb L   amLODipine (NORVASC) 5 MG tablet 90 tablet 1    Sig: Take 1 tablet (5 mg total) by mouth daily.    Authorizing Provider: Debbe Odea    Ordering User: Thayer Headings, Ralph Benavidez L   hydrochlorothiazide (HYDRODIURIL) 25 MG tablet 90 tablet 1    Sig: Take 1 tablet (25 mg total) by mouth daily.    Authorizing Provider: Debbe Odea    Ordering User: Thayer Headings, Karston Hyland L

## 2020-11-06 NOTE — Telephone Encounter (Signed)
*  STAT* If patient is at the pharmacy, call can be transferred to refill team.   1. Which medications need to be refilled? (please list name of each medication and dose if known) all cardiac meds  2. Which pharmacy/location (including street and city if local pharmacy) is medication to be sent to? [facility] [address]   3. Do they need a 30 day or 90 day supply? 90

## 2020-12-28 ENCOUNTER — Other Ambulatory Visit: Payer: Self-pay

## 2020-12-28 ENCOUNTER — Emergency Department
Admission: EM | Admit: 2020-12-28 | Discharge: 2020-12-28 | Disposition: A | Discharge status: Home / Self Care | Payer: BC Managed Care – PPO | Attending: Student in an Organized Health Care Education/Training Program | Admitting: Student in an Organized Health Care Education/Training Program

## 2020-12-28 DIAGNOSIS — Z79899 Other long term (current) drug therapy: Secondary | ICD-10-CM | POA: Diagnosis not present

## 2020-12-28 DIAGNOSIS — Y92410 Unspecified street and highway as the place of occurrence of the external cause: Secondary | ICD-10-CM | POA: Insufficient documentation

## 2020-12-28 DIAGNOSIS — S8992XA Unspecified injury of left lower leg, initial encounter: Secondary | ICD-10-CM | POA: Diagnosis present

## 2020-12-28 DIAGNOSIS — I10 Essential (primary) hypertension: Secondary | ICD-10-CM | POA: Insufficient documentation

## 2020-12-28 DIAGNOSIS — S80812A Abrasion, left lower leg, initial encounter: Secondary | ICD-10-CM | POA: Diagnosis not present

## 2020-12-28 MED ORDER — ONDANSETRON 4 MG PO TBDP
4.0000 mg | ORAL_TABLET | Freq: Once | ORAL | Status: AC
  Administered 2020-12-28: 4 mg via ORAL
  Filled 2020-12-28: qty 1

## 2020-12-28 MED ORDER — CYCLOBENZAPRINE HCL 10 MG PO TABS
5.0000 mg | ORAL_TABLET | Freq: Once | ORAL | Status: AC
  Administered 2020-12-28: 5 mg via ORAL
  Filled 2020-12-28: qty 1

## 2020-12-28 MED ORDER — IBUPROFEN 800 MG PO TABS
800.0000 mg | ORAL_TABLET | Freq: Once | ORAL | Status: AC
  Administered 2020-12-28: 800 mg via ORAL
  Filled 2020-12-28: qty 1

## 2020-12-28 MED ORDER — HYDROCODONE-ACETAMINOPHEN 5-325 MG PO TABS: 1.0000 | ORAL_TABLET | Freq: Three times a day (TID) | ORAL | 0 refills | Status: DC | PRN

## 2020-12-28 MED ORDER — CYCLOBENZAPRINE HCL 5 MG PO TABS: 5.0000 mg | ORAL_TABLET | Freq: Three times a day (TID) | ORAL | 0 refills | Status: DC | PRN

## 2020-12-28 NOTE — Discharge Instructions (Addendum)
Your exam is overall normal and reassuring problems Coccidia.  You may experience some generalized muscle pain and stiffness over the neck several days.  Take the prescription meds as provided, follow-up with primary provider for ongoing symptoms.
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
## 2021-01-12 ENCOUNTER — Other Ambulatory Visit: Payer: Self-pay | Admitting: Chiropractor

## 2021-01-12 ENCOUNTER — Other Ambulatory Visit
Admission: RE | Admit: 2021-01-12 | Discharge: 2021-01-12 | Disposition: A | Discharge status: Home / Self Care | Payer: BC Managed Care – PPO | Source: Ambulatory Visit | Attending: Family Medicine | Admitting: Family Medicine

## 2021-01-12 ENCOUNTER — Ambulatory Visit
Admission: RE | Admit: 2021-01-12 | Discharge: 2021-01-12 | Disposition: A | Discharge status: Home / Self Care | Payer: BC Managed Care – PPO | Source: Ambulatory Visit | Attending: Chiropractor | Admitting: Chiropractor

## 2021-01-12 DIAGNOSIS — S161XXA Strain of muscle, fascia and tendon at neck level, initial encounter: Secondary | ICD-10-CM

## 2021-01-12 DIAGNOSIS — M549 Dorsalgia, unspecified: Secondary | ICD-10-CM

## 2021-01-12 DIAGNOSIS — S3992XA Unspecified injury of lower back, initial encounter: Secondary | ICD-10-CM | POA: Diagnosis present

## 2021-01-12 DIAGNOSIS — M542 Cervicalgia: Secondary | ICD-10-CM | POA: Diagnosis not present

## 2021-03-15 ENCOUNTER — Other Ambulatory Visit: Payer: Self-pay

## 2021-03-15 MED ORDER — AMLODIPINE BESYLATE 5 MG PO TABS: 5.0000 mg | ORAL_TABLET | Freq: Every day | ORAL | 0 refills | Status: DC

## 2021-05-04 ENCOUNTER — Other Ambulatory Visit: Payer: Self-pay

## 2021-05-04 DIAGNOSIS — I10 Essential (primary) hypertension: Secondary | ICD-10-CM

## 2021-05-04 MED ORDER — LOSARTAN POTASSIUM 100 MG PO TABS: 100.0000 mg | ORAL_TABLET | Freq: Every day | ORAL | 0 refills | Status: DC

## 2021-05-04 MED ORDER — HYDROCHLOROTHIAZIDE 25 MG PO TABS: 25.0000 mg | ORAL_TABLET | Freq: Every day | ORAL | 0 refills | Status: DC

## 2021-06-08 ENCOUNTER — Other Ambulatory Visit: Payer: Self-pay

## 2021-06-08 MED ORDER — LOSARTAN POTASSIUM 100 MG PO TABS: 100.0000 mg | ORAL_TABLET | Freq: Every day | ORAL | 0 refills | Status: DC

## 2021-06-14 ENCOUNTER — Ambulatory Visit: Payer: BC Managed Care – PPO | Admitting: Cardiology

## 2021-06-14 ENCOUNTER — Encounter: Payer: Self-pay | Admitting: Cardiology

## 2021-06-14 VITALS — BP 134/80 | HR 79 | Ht 63.0 in | Wt 278.0 lb

## 2021-06-14 DIAGNOSIS — I10 Essential (primary) hypertension: Secondary | ICD-10-CM | POA: Diagnosis not present

## 2021-06-14 NOTE — Progress Notes (Signed)
Cardiology Office Note:    Date:  06/14/2021   ID:  Erica Ramos, DOB Mar 24, 1976, MRN [id number]  PCP:  Miki Kins, FNP  [facility] Cardiologist:  Debbe Odea, MD  [facility] Electrophysiologist:  None   Referring MD: Miki Kins, FNP   Chief Complaint  Patient presents with   Other    6 month follow up -- Meds reviewed verbally with patient    History of Present Illness:    Erica Ramos is a 46 y.o. female with a hx of hypertension, morbid obesity who presents for follow-up.    Being seen for follow-up of hypertension and obesity.  She has joint a gym, exercising more frequently, also monitoring her salt intake much better.  Her blood pressures are improving, she is also lost some weight.  Tolerating current medications, although she states she would like to come off some medicines.  She feels well overall, is happy with her blood pressure measurement improvements.  Prior notes Echo 03/2015 EF 55 to 60%  Past Medical History:  Diagnosis Date   Abscess of axilla, left 05/01/2013   Allergy    Anxiety    Boil, breast 10/22/2019   Bronchitis    chronic - has flare every December   Cervical polyp    hx of   Chest pain 05/01/2013   Delivered by cesarean section 06/05/2016   Depression, major, single episode, moderate (HCC) 04/19/2018   Environmental allergies    Genital warts    GERD (gastroesophageal reflux disease)    Per MD chart note 12/01/2010   Heart murmur    Hx of abnormal Pap smear 2012   Hypertension    Morbid obesity (HCC)    BMI 47.9   Persistent headaches    worst during menstrual cycle   PVC (premature ventricular contraction) 02/20/2015   STD (sexually transmitted disease)    hx genital warts/?HSV   Vaginal bleeding 07/15/2018   Vaginal Pap smear, abnormal     Past Surgical History:  Procedure Laterality Date   CESAREAN SECTION N/A 06/05/2016   Procedure: CESAREAN SECTION;  Surgeon: Hoover Browns, MD;  Location: [facility];  Service: Obstetrics;  Laterality: N/A;   CESAREAN SECTION N/A 09/20/2017   Procedure: CESAREAN SECTION;  Surgeon: Gerald Leitz, MD;  Location: [facility]TH[facility] SU[facility];  Service: Obstetrics;  Laterality: N/A;  EDD 10/02/17   COLPOSCOPY  2012   no treatment to cervix--pap smears reverted to normal   WISDOM TOOTH EXTRACTION      Current Medications: Current Meds  Medication Sig   acetaminophen (TYLENOL) 500 MG tablet Take 500-1,000 mg by mouth daily as needed for headache.    amLODipine (NORVASC) 5 MG tablet Take 1 tablet (5 mg total) by mouth daily.   calcium carbonate (TUMS - DOSED IN MG ELEMENTAL CALCIUM) 500 MG chewable tablet Chew 3-4 tablets by mouth daily as needed for indigestion or heartburn.   carvedilol (COREG) 25 MG tablet Take 1 tablet (25 mg total) by mouth 2 (two) times daily.   cetirizine (ZYRTEC) 10 MG tablet Take 1 tablet (10 mg total) by mouth daily.   cyclobenzaprine (FLEXERIL) 5 MG tablet Take 1 tablet (5 mg total) by mouth 3 (three) times daily as needed.   furosemide (LASIX) 20 MG tablet Take 1 tablet (20 mg total) by mouth daily as needed.   GARLIC PO Take 1 capsule by mouth daily.   hydrochlorothiazide (HYDRODIURIL) 25 MG tablet Take 1 tablet (25 mg total) by mouth  daily. PLEASE SCHEDULE OFFICE VISIT FOR FURTHER REFILLS. THANK YOU!   HYDROcodone-acetaminophen (NORCO) 5-325 MG tablet Take 1 tablet by mouth 3 (three) times daily as needed.   losartan (COZAAR) 100 MG tablet Take 1 tablet (100 mg total) by mouth daily. PLEASE SCHEDULE OFFICE VISIT FOR FURTHER REFILLS. THANK YOU!   omeprazole (PRILOSEC) 20 MG capsule Take 1 capsule (20 mg total) by mouth daily.   OZEMPIC, 0.25 OR 0.5 MG/DOSE, 2 MG/1.5ML SOPN Inject 0.5 mg into the skin once a week.   Prenatal Vit-Fe Fumarate-FA (MULTIVITAMIN-PRENATAL) 27-0.8 MG TABS tablet Take 1 tablet by mouth daily.    sertraline (ZOLOFT) 50 MG tablet Take 1 tablet (50 mg total) by mouth daily. Take 1/2 tab daily for 1 week and then go to  1 tab daily if tolerated.   triamcinolone (NASACORT AQ) 55 MCG/ACT AERO nasal inhaler Place 2 sprays into the nose daily.   triamcinolone cream (KENALOG) 0.1 % Apply 1 application topically 2 (two) times daily as needed.     Allergies:   Bacitracin-neomycin-polymyxin, Neosporin [neomycin-bacitracin zn-polymyx], Penicillins, and Strawberry flavor   Social History   Socioeconomic History   Marital status: Single    Spouse name: Not on file   Number of children: 0   Years of education: Not on file   Highest education level: Not on file  Occupational History    Employer: [employer]  Tobacco Use   Smoking status: Never   Smokeless tobacco: Never  Vaping Use   Vaping Use: Never used  Substance and Sexual Activity   Alcohol use: Yes    Alcohol/week: 1.0 standard drink    Types: 1 Standard drinks or equivalent per week    Comment: Occasionally; less than 1-2 a week   Drug use: No   Sexual activity: Yes    Partners: Male    Birth control/protection: Condom    Comment: condoms sometimes  Other Topics Concern   Not on file  Social History Narrative   Exercise--  2-3 x a week for about 30 min- 60 min   Social Determinants of Corporate investment banker Strain: Not on file  Food Insecurity: Not on file  Transportation Needs: Not on file  Physical Activity: Not on file  Stress: Not on file  Social Connections: Not on file     Family History: The patient's family history includes Alcohol abuse in her father; Diabetes in her father, maternal aunt, maternal grandmother, maternal uncle, and mother; Heart Problems in her maternal grandmother and paternal grandmother; Heart attack in her paternal grandmother; Hypertension in her maternal grandmother, mother, sister, and another family member; Obesity in an other family member; Seizures in her maternal aunt. There is no history of COPD.  ROS:   Please see the history of present illness.     All other systems reviewed and are  negative.  EKGs/Labs/Other Studies Reviewed:    The following studies were reviewed today:   EKG:  EKG is ordered today.  EKG shows normal sinus rhythm, normal ECG.  Recent Labs: No results found for requested labs within last 8760 hours.  Recent Lipid Panel    Component Value Date/Time   CHOL 137 01/08/2020 0939   TRIG 80 01/08/2020 0939   HDL 54 01/08/2020 0939   CHOLHDL 2.5 01/08/2020 0939   CHOLHDL 3 03/15/2019 1135   VLDL 17.4 03/15/2019 1135   LDLCALC 67 01/08/2020 0939    Physical Exam:    VS:  BP 134/80 (BP Location: Left Arm,  Patient Position: Sitting, Cuff Size: Large)    Pulse 79    Ht 5\' 3"  (1.6 m)    Wt 278 lb (126.1 kg)    SpO2 98%    BMI 49.25 kg/m     Wt Readings from Last 3 Encounters:  06/14/21 278 lb (126.1 kg)  12/28/20 295 lb (133.8 kg)  10/16/20 298 lb (135.2 kg)     GEN:  Well nourished, well developed in no acute distress HEENT: Normal NECK: No JVD; No carotid bruits LYMPHATICS: No lymphadenopathy CARDIAC: RRR, no murmurs, rubs, gallops RESPIRATORY:  Clear to auscultation without rales, wheezing or rhonchi  ABDOMEN: Soft, non-tender, distended MUSCULOSKELETAL:  1+ edema; No deformity  SKIN: Warm and dry NEUROLOGIC:  Alert and oriented x 3 PSYCHIATRIC:  Normal affect   ASSESSMENT:    1. Primary hypertension   2. Morbid obesity (HCC)      PLAN:    In order of problems listed above:  Hypertension, BP much improved, better controlled.  Continue amlodipine, losartan 100 mg daily, Coreg 25 mg twice daily, HCTZ 25 mg daily.  Low-salt diet advised, exercise advised.  If BP continues to improve, will consider decreasing BP meds, starting with amlodipine. Patient is morbidly obese, congratulated and encouraged on current weight loss.  Continue low-calorie diet, weight loss.   Follow-up in 3 months.   Medication Adjustments/Labs and Tests Ordered: Current medicines are reviewed at length with the patient today.  Concerns regarding  medicines are outlined above.  Orders Placed This Encounter  Procedures   EKG 12-Lead    No orders of the defined types were placed in this encounter.    Patient Instructions  Medication Instructions:     *If you need a refill on your cardiac medications before your next appointment, please call your pharmacy*   Lab Work:  None ordered  If you have labs (blood work) drawn today and your tests are completely normal, you will receive your results only by: MyChart Message (if you have MyChart) OR A paper copy in the mail If you have any lab test that is abnormal or we need to change your treatment, we will call you to review the results.   Testing/Procedures:  None ordered   Follow-Up: At [facility], you and your health needs are our priority.  As part of our continuing mission to provide you with exceptional heart care, we have created designated Provider Care Teams.  These Care Teams include your primary Cardiologist (physician) and Advanced Practice Providers (APPs -  Physician Assistants and Nurse Practitioners) who all work together to provide you with the care you need, when you need it.  We recommend signing up for the patient portal called "MyChart".  Sign up information is provided on this After Visit Summary.  MyChart is used to connect with patients for Virtual Visits (Telemedicine).  Patients are able to view lab/test results, encounter notes, upcoming appointments, etc.  Non-urgent messages can be sent to your provider as well.   To learn more about what you can do with MyChart, go to [email or web address].    Your next appointment:   3 month(s)  The format for your next appointment:   In Person  Provider:    ONLY with Dr. Azucena Cecil   Other Instructions     Signed, Debbe Odea, MD  06/14/2021 10:15 AM    [facility]

## 2021-06-14 NOTE — Patient Instructions (Signed)
Medication Instructions:     *If you need a refill on your cardiac medications before your next appointment, please call your pharmacy*   Lab Work:  None ordered  If you have labs (blood work) drawn today and your tests are completely normal, you will receive your results only by: MyChart Message (if you have MyChart) OR A paper copy in the mail If you have any lab test that is abnormal or we need to change your treatment, we will call you to review the results.   Testing/Procedures:  None ordered   Follow-Up: At [facility], you and your health needs are our priority.  As part of our continuing mission to provide you with exceptional heart care, we have created designated Provider Care Teams.  These Care Teams include your primary Cardiologist (physician) and Advanced Practice Providers (APPs -  Physician Assistants and Nurse Practitioners) who all work together to provide you with the care you need, when you need it.  We recommend signing up for the patient portal called "MyChart".  Sign up information is provided on this After Visit Summary.  MyChart is used to connect with patients for Virtual Visits (Telemedicine).  Patients are able to view lab/test results, encounter notes, upcoming appointments, etc.  Non-urgent messages can be sent to your provider as well.   To learn more about what you can do with MyChart, go to [email or web address].    Your next appointment:   3 month(s)  The format for your next appointment:   In Person  Provider:    ONLY with Dr. Azucena Cecil   Other Instructions

## 2021-07-02 ENCOUNTER — Other Ambulatory Visit: Payer: Self-pay

## 2021-07-02 ENCOUNTER — Encounter: Payer: BC Managed Care – PPO | Admitting: Certified Nurse Midwife

## 2021-07-02 MED ORDER — CARVEDILOL 25 MG PO TABS: 25.0000 mg | ORAL_TABLET | Freq: Two times a day (BID) | ORAL | 0 refills | Status: DC

## 2021-07-05 ENCOUNTER — Encounter: Payer: Self-pay | Admitting: Cardiology

## 2021-07-19 ENCOUNTER — Other Ambulatory Visit: Payer: Self-pay | Admitting: *Deleted

## 2021-07-19 MED ORDER — AMLODIPINE BESYLATE 5 MG PO TABS: 5.0000 mg | ORAL_TABLET | Freq: Every day | ORAL | 0 refills | Status: DC

## 2021-07-20 ENCOUNTER — Other Ambulatory Visit: Payer: Self-pay

## 2021-07-20 DIAGNOSIS — I10 Essential (primary) hypertension: Secondary | ICD-10-CM

## 2021-07-20 MED ORDER — HYDROCHLOROTHIAZIDE 25 MG PO TABS: 25.0000 mg | ORAL_TABLET | Freq: Every day | ORAL | 2 refills | Status: DC

## 2021-08-05 ENCOUNTER — Other Ambulatory Visit: Payer: Self-pay | Admitting: Cardiology

## 2021-08-05 MED ORDER — LOSARTAN POTASSIUM 100 MG PO TABS: 100.0000 mg | ORAL_TABLET | Freq: Every day | ORAL | 0 refills | Status: DC

## 2021-09-24 ENCOUNTER — Ambulatory Visit: Payer: BC Managed Care – PPO | Admitting: Cardiology

## 2021-09-24 ENCOUNTER — Other Ambulatory Visit: Payer: Self-pay

## 2021-09-24 MED ORDER — CARVEDILOL 25 MG PO TABS: 25.0000 mg | ORAL_TABLET | Freq: Two times a day (BID) | ORAL | 0 refills | Status: DC

## 2021-10-01 ENCOUNTER — Other Ambulatory Visit: Payer: Self-pay

## 2021-10-01 DIAGNOSIS — I10 Essential (primary) hypertension: Secondary | ICD-10-CM

## 2021-10-01 MED ORDER — HYDROCHLOROTHIAZIDE 25 MG PO TABS: 25.0000 mg | ORAL_TABLET | Freq: Every day | ORAL | 0 refills | Status: DC

## 2021-10-01 MED ORDER — AMLODIPINE BESYLATE 5 MG PO TABS: 5.0000 mg | ORAL_TABLET | Freq: Every day | ORAL | 0 refills | Status: DC

## 2021-10-25 ENCOUNTER — Other Ambulatory Visit: Payer: Self-pay | Admitting: *Deleted

## 2021-10-25 DIAGNOSIS — I10 Essential (primary) hypertension: Secondary | ICD-10-CM

## 2021-10-25 MED ORDER — HYDROCHLOROTHIAZIDE 25 MG PO TABS: 25.0000 mg | ORAL_TABLET | Freq: Every day | ORAL | 0 refills | Status: DC

## 2021-11-01 ENCOUNTER — Other Ambulatory Visit: Payer: Self-pay | Admitting: *Deleted

## 2021-11-01 MED ORDER — LOSARTAN POTASSIUM 100 MG PO TABS: 100.0000 mg | ORAL_TABLET | Freq: Every day | ORAL | 0 refills | Status: DC

## 2021-11-04 ENCOUNTER — Encounter: Payer: Medicaid Other | Admitting: Obstetrics and Gynecology

## 2021-11-11 ENCOUNTER — Ambulatory Visit: Payer: BC Managed Care – PPO | Admitting: Cardiology

## 2021-11-25 ENCOUNTER — Ambulatory Visit: Payer: BC Managed Care – PPO | Admitting: Family Medicine

## 2021-11-26 ENCOUNTER — Other Ambulatory Visit: Payer: Self-pay | Admitting: *Deleted

## 2021-11-26 DIAGNOSIS — I10 Essential (primary) hypertension: Secondary | ICD-10-CM

## 2021-11-29 MED ORDER — HYDROCHLOROTHIAZIDE 25 MG PO TABS: ORAL_TABLET | ORAL | 0 refills | Status: DC

## 2021-12-02 ENCOUNTER — Ambulatory Visit: Payer: BC Managed Care – PPO | Admitting: Family Medicine

## 2021-12-08 ENCOUNTER — Encounter (INDEPENDENT_AMBULATORY_CARE_PROVIDER_SITE_OTHER): Payer: Self-pay

## 2021-12-13 ENCOUNTER — Ambulatory Visit: Payer: BC Managed Care – PPO | Admitting: Family Medicine

## 2021-12-21 ENCOUNTER — Other Ambulatory Visit: Payer: Self-pay

## 2021-12-21 MED ORDER — CARVEDILOL 25 MG PO TABS: 25.0000 mg | ORAL_TABLET | Freq: Two times a day (BID) | ORAL | 0 refills | Status: DC

## 2021-12-24 ENCOUNTER — Ambulatory Visit: Payer: No Typology Code available for payment source | Admitting: Cardiology

## 2021-12-28 NOTE — Progress Notes (Deleted)
Cardiology Office Note    Date:  12/28/2021   ID:  Erica Ramos, DOB [birth year], MRN [id number]  PCP:  Miki Kins, FNP  Cardiologist:  Debbe Odea, MD  Electrophysiologist:  None   Chief Complaint: Follow-up  History of Present Illness:   Erica Ramos is a 46 y.o. female with history of HTN, obesity, and anxiety who presents for follow-up of HTN.  She was previously evaluated by Dr. Duke Salvia in 2016 for evaluation of abnormal EKG.  48-hour Holter monitor at that time showed 10% PVC burden with occasional PACs.  Echo at that time demonstrated an EF of 55 to 60%, mild LVH, no regional wall motion abnormalities, mildly dilated left atrium, trivial mitral regurgitation, and normal size aortic root.  She was lost to follow-up from 2017 until reestablishing with Dr. Azucena Cecil in 2021 for evaluation of hypertension.  She was last seen in the office in 06/2021 with a blood pressure of 134/80.  She was monitoring her salt intake much better.  She was exercising more frequently after joining a gym.  She was tolerating medications though did want to come off some as able.  At that time, she was continued on amlodipine, losartan, carvedilol, and HCTZ.  ***   Labs independently reviewed: 01/2020 - TC 137, TG 80, HDL 54, LDL 67, BUN 13, serum creatinine 0.8, potassium 4.3 10/2019 - A1c 5.8, Hgb 12.7, PLT 310, albumin 4.1, AST/ALT normal 08/2019 - TSH normal  Past Medical History:  Diagnosis Date   Abscess of axilla, left 05/01/2013   Allergy    Anxiety    Boil, breast 10/22/2019   Bronchitis    chronic - has flare every December   Cervical polyp    hx of   Chest pain 05/01/2013   Delivered by cesarean section 06/05/2016   Depression, major, single episode, moderate (HCC) 04/19/2018   Environmental allergies    Genital warts    GERD (gastroesophageal reflux disease)    Per MD chart note 12/01/2010   Heart murmur    Hx of abnormal Pap smear 2012   Hypertension    Morbid  obesity (HCC)    BMI 47.9   Persistent headaches    worst during menstrual cycle   PVC (premature ventricular contraction) 02/20/2015   STD (sexually transmitted disease)    hx genital warts/?HSV   Vaginal bleeding 07/15/2018   Vaginal Pap smear, abnormal     Past Surgical History:  Procedure Laterality Date   CESAREAN SECTION N/A 06/05/2016   Procedure: CESAREAN SECTION;  Surgeon: Hoover Browns, MD;  Location: [facility];  Service: Obstetrics;  Laterality: N/A;   CESAREAN SECTION N/A 09/20/2017   Procedure: CESAREAN SECTION;  Surgeon: Gerald Leitz, MD;  Location: [facility];  Service: Obstetrics;  Laterality: N/A;  EDD 10/02/17   COLPOSCOPY  2012   no treatment to cervix--pap smears reverted to normal   WISDOM TOOTH EXTRACTION      Current Medications: No outpatient medications have been marked as taking for the 12/29/21 encounter (Appointment) with Sondra Barges, PA-C.    Allergies:   Bacitracin-neomycin-polymyxin, Neosporin [neomycin-bacitracin zn-polymyx], Penicillins, and Strawberry flavor   Social History   Socioeconomic History   Marital status: Single    Spouse name: Not on file   Number of children: 0   Years of education: Not on file   Highest education level: Not on file  Occupational History    Employer: [employer]  Tobacco Use   Smoking status:  Never   Smokeless tobacco: Never  Vaping Use   Vaping Use: Never used  Substance and Sexual Activity   Alcohol use: Yes    Alcohol/week: 1.0 standard drink of alcohol    Types: 1 Standard drinks or equivalent per week    Comment: Occasionally; less than 1-2 a week   Drug use: No   Sexual activity: Yes    Partners: Male    Birth control/protection: Condom    Comment: condoms sometimes  Other Topics Concern   Not on file  Social History Narrative   Exercise--  2-3 x a week for about 30 min- 60 min   Social Determinants of Health   Financial Resource Strain: Low Risk  (01/24/2020)   Overall  Financial Resource Strain (CARDIA)    Difficulty of Paying Living Expenses: Not hard at all  Food Insecurity: Not on file  Transportation Needs: Not on file  Physical Activity: Insufficiently Active (01/24/2020)   Exercise Vital Sign    Days of Exercise per Week: 2 days    Minutes of Exercise per Session: 30 min  Stress: Not on file  Social Connections: Not on file     Family History:  The patient's family history includes Alcohol abuse in her father; Diabetes in her father, maternal aunt, maternal grandmother, maternal uncle, and mother; Heart Problems in her maternal grandmother and paternal grandmother; Heart attack in her paternal grandmother; Hypertension in her maternal grandmother, mother, sister, and another family member; Obesity in an other family member; Seizures in her maternal aunt. There is no history of COPD.  ROS:   ROS   EKGs/Labs/Other Studies Reviewed:    Studies reviewed were summarized above. The additional studies were reviewed today:  48-hour Holter 03/2015: Quality: Fair.  Baseline artifact. Predominant rhythm: sinus rhythm Average heart rate: 77 bpm Max heart rate: 110 bpm Min heart rate: 46 bpm Pauses >2.5 seconds: 0 Ventricular ectopics: 18,952 (1084 isolated, 533 bigemeny, 178 couplets, one triplet)  Percentage of ventricular  ectopic beats: 10.3% Morphology: polymorphic with 2 different morphologies Supraventricular ectopics: 39  __________  2D echo 03/03/2015: - Left ventricle: The cavity size was normal. Wall thickness was    increased in a pattern of mild LVH. Systolic function was normal.    The estimated ejection fraction was in the range of 55% to 60%.    Wall motion was normal; there were no regional wall motion    abnormalities.  - Left atrium: The atrium was mildly dilated.  - Atrial septum: No defect or patent foramen ovale was identified.   EKG:  EKG is ordered today.  The EKG ordered today demonstrates ***  Recent Labs: No  results found for requested labs within last 365 days.  Recent Lipid Panel    Component Value Date/Time   CHOL 137 01/08/2020 0939   TRIG 80 01/08/2020 0939   HDL 54 01/08/2020 0939   CHOLHDL 2.5 01/08/2020 0939   CHOLHDL 3 03/15/2019 1135   VLDL 17.4 03/15/2019 1135   LDLCALC 67 01/08/2020 0939    PHYSICAL EXAM:    VS:  There were no vitals taken for this visit.  BMI: There is no height or weight on file to calculate BMI.  Physical Exam  Wt Readings from Last 3 Encounters:  06/14/21 278 lb (126.1 kg)  12/28/20 295 lb (133.8 kg)  10/16/20 298 lb (135.2 kg)     ASSESSMENT & PLAN:   HTN: Blood pressure ***   {Are you ordering a CV Procedure (e.g.  stress test, cath, DCCV, TEE, etc)?   Press F2        :[id number]}     Disposition: F/u with Dr. Azucena Cecil or an APP in ***.   Medication Adjustments/Labs and Tests Ordered: Current medicines are reviewed at length with the patient today.  Concerns regarding medicines are outlined above. Medication changes, Labs and Tests ordered today are summarized above and listed in the Patient Instructions accessible in Encounters.   Signed, Eula Listen, PA-C 12/28/2021 5:06 PM     [facility] [place] [address] [place], [place] [address] [phone]

## 2021-12-29 ENCOUNTER — Ambulatory Visit: Payer: No Typology Code available for payment source | Admitting: Physician Assistant

## 2021-12-29 NOTE — Progress Notes (Deleted)
Cardiology Office Note    Date:  12/29/2021   ID:  Erica Ramos, DOB 1975/09/10, MRN [id number]  PCP:  Miki Kins, FNP  Cardiologist:  Debbe Odea, MD  Electrophysiologist:  None   Chief Complaint: ***  History of Present Illness:   Erica Ramos is a 46 y.o. female with history of HTN, obesity, and anxiety who presents for follow-up of HTN.  She was previously evaluated by Dr. Duke Salvia in 2016 for evaluation of abnormal EKG.  48-hour Holter monitor at that time showed 10% PVC burden with occasional PACs.  Echo at that time demonstrated an EF of 55 to 60%, mild LVH, no regional wall motion abnormalities, mildly dilated left atrium, trivial mitral regurgitation, and normal size aortic root.  She reestablished care with Dr. Azucena Cecil in 2021 for evaluation of hypertension.  She was last seen in the office in 06/2021 with a blood pressure of 134/80.  She was monitoring her salt intake much better.  She was exercising more frequently after joining a gym.  She was tolerating medications though did want to come off some as able.  At that time, she was continued on amlodipine, losartan, carvedilol, and HCTZ.  ***   Labs independently reviewed: 01/2020 - TC 137, TG 80, HDL 54, LDL 67, BUN 13, serum creatinine 0.8, potassium 4.3 10/2019 - A1c 5.8, Hgb 12.7, PLT 310, albumin 4.1, AST/ALT normal 08/2019 - TSH normal    Past Medical History:  Diagnosis Date   Abscess of axilla, left 05/01/2013   Allergy    Anxiety    Boil, breast 10/22/2019   Bronchitis    chronic - has flare every December   Cervical polyp    hx of   Chest pain 05/01/2013   Delivered by cesarean section 06/05/2016   Depression, major, single episode, moderate (HCC) 04/19/2018   Environmental allergies    Genital warts    GERD (gastroesophageal reflux disease)    Per MD chart note 12/01/2010   Heart murmur    Hx of abnormal Pap smear 2012   Hypertension    Morbid obesity (HCC)    BMI 47.9    Persistent headaches    worst during menstrual cycle   PVC (premature ventricular contraction) 02/20/2015   STD (sexually transmitted disease)    hx genital warts/?HSV   Vaginal bleeding 07/15/2018   Vaginal Pap smear, abnormal     Past Surgical History:  Procedure Laterality Date   CESAREAN SECTION N/A 06/05/2016   Procedure: CESAREAN SECTION;  Surgeon: Hoover Browns, MD;  Location: [facility];  Service: Obstetrics;  Laterality: N/A;   CESAREAN SECTION N/A 09/20/2017   Procedure: CESAREAN SECTION;  Surgeon: Gerald Leitz, MD;  Location: [facility]TH[facility] SU[facility];  Service: Obstetrics;  Laterality: N/A;  EDD 10/02/17   COLPOSCOPY  2012   no treatment to cervix--pap smears reverted to normal   WISDOM TOOTH EXTRACTION      Current Medications: No outpatient medications have been marked as taking for the 12/31/21 encounter (Appointment) with Sondra Barges, PA-C.    Allergies:   Bacitracin-neomycin-polymyxin, Neosporin [neomycin-bacitracin zn-polymyx], Penicillins, and Strawberry flavor   Social History   Socioeconomic History   Marital status: Single    Spouse name: Not on file   Number of children: 0   Years of education: Not on file   Highest education level: Not on file  Occupational History    Employer: [employer]  Tobacco Use   Smoking status: Never   Smokeless  tobacco: Never  Vaping Use   Vaping Use: Never used  Substance and Sexual Activity   Alcohol use: Yes    Alcohol/week: 1.0 standard drink of alcohol    Types: 1 Standard drinks or equivalent per week    Comment: Occasionally; less than 1-2 a week   Drug use: No   Sexual activity: Yes    Partners: Male    Birth control/protection: Condom    Comment: condoms sometimes  Other Topics Concern   Not on file  Social History Narrative   Exercise--  2-3 x a week for about 30 min- 60 min   Social Determinants of Health   Financial Resource Strain: Low Risk  (01/24/2020)   Overall Financial Resource Strain (CARDIA)     Difficulty of Paying Living Expenses: Not hard at all  Food Insecurity: Not on file  Transportation Needs: Not on file  Physical Activity: Insufficiently Active (01/24/2020)   Exercise Vital Sign    Days of Exercise per Week: 2 days    Minutes of Exercise per Session: 30 min  Stress: Not on file  Social Connections: Not on file     Family History:  The patient's family history includes Alcohol abuse in her father; Diabetes in her father, maternal aunt, maternal grandmother, maternal uncle, and mother; Heart Problems in her maternal grandmother and paternal grandmother; Heart attack in her paternal grandmother; Hypertension in her maternal grandmother, mother, sister, and another family member; Obesity in an other family member; Seizures in her maternal aunt. There is no history of COPD.  ROS:   ROS   EKGs/Labs/Other Studies Reviewed:    Studies reviewed were summarized above. The additional studies were reviewed today:  48-hour Holter 03/2015: Quality: Fair.  Baseline artifact. Predominant rhythm: sinus rhythm Average heart rate: 77 bpm Max heart rate: 110 bpm Min heart rate: 46 bpm Pauses >2.5 seconds: 0 Ventricular ectopics: 18,952 (1084 isolated, 533 bigemeny, 178 couplets, one triplet)  Percentage of ventricular  ectopic beats: 10.3% Morphology: polymorphic with 2 different morphologies Supraventricular ectopics: 39  __________  2D echo 03/03/2015: - Left ventricle: The cavity size was normal. Wall thickness was    increased in a pattern of mild LVH. Systolic function was normal.    The estimated ejection fraction was in the range of 55% to 60%.    Wall motion was normal; there were no regional wall motion    abnormalities.  - Left atrium: The atrium was mildly dilated.  - Atrial septum: No defect or patent foramen ovale was identified.    EKG:  EKG is ordered today.  The EKG ordered today demonstrates ***  Recent Labs: No results found for requested labs within  last 365 days.  Recent Lipid Panel    Component Value Date/Time   CHOL 137 01/08/2020 0939   TRIG 80 01/08/2020 0939   HDL 54 01/08/2020 0939   CHOLHDL 2.5 01/08/2020 0939   CHOLHDL 3 03/15/2019 1135   VLDL 17.4 03/15/2019 1135   LDLCALC 67 01/08/2020 0939    PHYSICAL EXAM:    VS:  There were no vitals taken for this visit.  BMI: There is no height or weight on file to calculate BMI.  Physical Exam  Wt Readings from Last 3 Encounters:  06/14/21 278 lb (126.1 kg)  12/28/20 295 lb (133.8 kg)  10/16/20 298 lb (135.2 kg)     ASSESSMENT & PLAN:   HTN: Blood pressure ***   {Are you ordering a CV Procedure (e.g. stress test, cath,  DCCV, TEE, etc)?   Press F2        :[id number]}     Disposition: F/u with Dr. Azucena Cecil or an APP in ***.   Medication Adjustments/Labs and Tests Ordered: Current medicines are reviewed at length with the patient today.  Concerns regarding medicines are outlined above. Medication changes, Labs and Tests ordered today are summarized above and listed in the Patient Instructions accessible in Encounters.   Signed, Eula Listen, PA-C 12/29/2021 4:48 PM     [facility] - [place] [address] [place], [place] [address] [phone]

## 2021-12-31 ENCOUNTER — Ambulatory Visit: Payer: No Typology Code available for payment source | Admitting: Physician Assistant

## 2021-12-31 ENCOUNTER — Telehealth: Payer: Self-pay | Admitting: Cardiology

## 2021-12-31 DIAGNOSIS — I10 Essential (primary) hypertension: Secondary | ICD-10-CM

## 2021-12-31 MED ORDER — LOSARTAN POTASSIUM 100 MG PO TABS: 100.0000 mg | ORAL_TABLET | Freq: Every day | ORAL | 1 refills | Status: DC

## 2021-12-31 MED ORDER — HYDROCHLOROTHIAZIDE 25 MG PO TABS: ORAL_TABLET | ORAL | 1 refills | Status: DC

## 2021-12-31 MED ORDER — AMLODIPINE BESYLATE 5 MG PO TABS: 5.0000 mg | ORAL_TABLET | Freq: Every day | ORAL | 1 refills | Status: DC

## 2021-12-31 NOTE — Telephone Encounter (Signed)
Pt's medication was sent to pt's pharmacy as requested. Confirmation received.  °

## 2021-12-31 NOTE — Telephone Encounter (Signed)
*  STAT* If patient is at the pharmacy, call can be transferred to refill team.   1. Which medications need to be refilled? (please list name of each medication and dose if known)   losartan (COZAAR) 100 MG tablet  hydrochlorothiazide (HYDRODIURIL) 25 MG tablet  amLODipine (NORVASC) 5 MG tablet (Expired)  2. Which pharmacy/location (including street and city if local pharmacy) is medication to be sent to?  [id number] [address], [place] [phone]  3. Do they need a 30 day or 90 day supply?   90 day supply  Patient stated she will be out of this medication in 2 weeks.  Patient has appointment scheduled on 02/18/22.

## 2022-01-06 ENCOUNTER — Encounter: Payer: Medicaid Other | Admitting: Obstetrics and Gynecology

## 2022-01-07 ENCOUNTER — Telehealth: Payer: Self-pay

## 2022-01-07 NOTE — Telephone Encounter (Signed)
Pt called our office and stated she is pregnant, about 7-8 weeks. Has noticed blood when she wipes only and some mild cramping, this morning she put a pad on. Denies any heavy vag bleeding or severe pelvic pain. Advised per protocol that if a heavy flow or severe pelvic pain develops she needs to go to ER.

## 2022-01-26 ENCOUNTER — Encounter: Payer: Medicaid Other | Admitting: Obstetrics and Gynecology

## 2022-02-18 ENCOUNTER — Ambulatory Visit: Payer: No Typology Code available for payment source | Admitting: Cardiology

## 2022-03-16 ENCOUNTER — Other Ambulatory Visit: Payer: Self-pay

## 2022-03-16 MED ORDER — CARVEDILOL 25 MG PO TABS: 25.0000 mg | ORAL_TABLET | Freq: Two times a day (BID) | ORAL | 0 refills | Status: DC

## 2022-04-04 ENCOUNTER — Ambulatory Visit: Payer: BC Managed Care – PPO | Admitting: Cardiology

## 2022-04-13 ENCOUNTER — Other Ambulatory Visit: Payer: Self-pay

## 2022-04-13 MED ORDER — CARVEDILOL 25 MG PO TABS: 25.0000 mg | ORAL_TABLET | Freq: Two times a day (BID) | ORAL | 0 refills | Status: DC

## 2022-04-13 NOTE — Telephone Encounter (Signed)
Carvedilol 25 mg # 60 only with message patient must keep her scheduled 05/19/22 for future refills , final attempt

## 2022-04-21 ENCOUNTER — Ambulatory Visit: Payer: BC Managed Care – PPO | Admitting: Obstetrics and Gynecology

## 2022-04-21 ENCOUNTER — Ambulatory Visit: Payer: BC Managed Care – PPO | Admitting: Surgery

## 2022-05-09 NOTE — Progress Notes (Deleted)
Patient ID: Erica Ramos, female   DOB: [birth year], 47 y.o.   MRN: [id number]  Chief Complaint:  ***  History of Present Illness Erica Ramos is a 47 y.o. female with ***.  Past Medical History Past Medical History:  Diagnosis Date   Abscess of axilla, left 05/01/2013   Allergy    Anxiety    Boil, breast 10/22/2019   Bronchitis    chronic - has flare every December   Cervical polyp    hx of   Chest pain 05/01/2013   Delivered by cesarean section 06/05/2016   Depression, major, single episode, moderate (HCC) 04/19/2018   Environmental allergies    Genital warts    GERD (gastroesophageal reflux disease)    Per MD chart note 12/01/2010   Heart murmur    Hx of abnormal Pap smear 2012   Hypertension    Morbid obesity (HCC)    BMI 47.9   Persistent headaches    worst during menstrual cycle   PVC (premature ventricular contraction) 02/20/2015   STD (sexually transmitted disease)    hx genital warts/?HSV   Vaginal bleeding 07/15/2018   Vaginal Pap smear, abnormal       Past Surgical History:  Procedure Laterality Date   CESAREAN SECTION N/A 06/05/2016   Procedure: CESAREAN SECTION;  Surgeon: Hoover Browns, MD;  Location: [facility];  Service: Obstetrics;  Laterality: N/A;   CESAREAN SECTION N/A 09/20/2017   Procedure: CESAREAN SECTION;  Surgeon: Gerald Leitz, MD;  Location: [facility];  Service: Obstetrics;  Laterality: N/A;  EDD 10/02/17   COLPOSCOPY  2012   no treatment to cervix--pap smears reverted to normal   WISDOM TOOTH EXTRACTION      Allergies  Allergen Reactions   Bacitracin-Neomycin-Polymyxin Swelling    Facial swelling with topical Neosporin   Neosporin [Neomycin-Bacitracin Zn-Polymyx] Anaphylaxis   Penicillins Nausea And Vomiting, Rash and Other (See Comments)    Has patient had a PCN reaction causing immediate rash, facial/tongue/throat swelling, SOB or lightheadedness with hypotension: Yes Has patient had a PCN reaction causing severe rash  involving mucus membranes or skin necrosis: Yes Has patient had a PCN reaction that required hospitalization Yes Has patient had a PCN reaction occurring within the last 10 years: No If all of the above answers are "NO", then may proceed with Cephalosporin use.  fever Has patient had a PCN reaction causing immediate rash, facial/tongue/throat swelling, SOB or lightheadedness with hypotension: Yes Has patient had a PCN reaction causing severe rash involving mucus membranes or skin necrosis: Yes Has patient had a PCN reaction that required hospitalization Yes   Strawberry Flavor     Current Outpatient Medications  Medication Sig Dispense Refill   acetaminophen (TYLENOL) 500 MG tablet Take 500-1,000 mg by mouth daily as needed for headache.      amLODipine (NORVASC) 5 MG tablet Take 1 tablet (5 mg total) by mouth daily. 90 tablet 1   calcium carbonate (TUMS - DOSED IN MG ELEMENTAL CALCIUM) 500 MG chewable tablet Chew 3-4 tablets by mouth daily as needed for indigestion or heartburn.     carvedilol (COREG) 25 MG tablet Take 1 tablet (25 mg total) by mouth 2 (two) times daily. Please keep appointment on 05/19/22  for further refills / Final attempt. 60 tablet 0   cetirizine (ZYRTEC) 10 MG tablet Take 1 tablet (10 mg total) by mouth daily. 90 tablet 3   cyclobenzaprine (FLEXERIL) 5 MG tablet Take 1 tablet (5 mg total) by mouth 3 (  three) times daily as needed. 15 tablet 0   furosemide (LASIX) 20 MG tablet Take 1 tablet (20 mg total) by mouth daily as needed. 90 tablet 3   GARLIC PO Take 1 capsule by mouth daily.     hydrochlorothiazide (HYDRODIURIL) 25 MG tablet Take 1 tablet (25 mg) by mouth once daily 90 tablet 1   HYDROcodone-acetaminophen (NORCO) 5-325 MG tablet Take 1 tablet by mouth 3 (three) times daily as needed. 10 tablet 0   losartan (COZAAR) 100 MG tablet Take 1 tablet (100 mg total) by mouth daily. 90 tablet 1   omeprazole (PRILOSEC) 20 MG capsule Take 1 capsule (20 mg total) by mouth  daily. 30 capsule 5   OZEMPIC, 0.25 OR 0.5 MG/DOSE, 2 MG/1.5ML SOPN Inject 0.5 mg into the skin once a week.     Prenatal Vit-Fe Fumarate-FA (MULTIVITAMIN-PRENATAL) 27-0.8 MG TABS tablet Take 1 tablet by mouth daily.      sertraline (ZOLOFT) 50 MG tablet Take 1 tablet (50 mg total) by mouth daily. Take 1/2 tab daily for 1 week and then go to 1 tab daily if tolerated. 90 tablet 3   triamcinolone (NASACORT AQ) 55 MCG/ACT AERO nasal inhaler Place 2 sprays into the nose daily. 1 Inhaler 12   triamcinolone cream (KENALOG) 0.1 % Apply 1 application topically 2 (two) times daily as needed. 30 g 0   No current facility-administered medications for this visit.    Family History Family History  Problem Relation Age of Onset   Diabetes Mother    Hypertension Mother        Under control   Diabetes Maternal Aunt    Seizures Maternal Aunt    Diabetes Maternal Grandmother    Hypertension Maternal Grandmother    Heart Problems Maternal Grandmother    Alcohol abuse Father    Diabetes Father    Hypertension Sister    Diabetes Maternal Uncle    Hypertension Other    Obesity Other    Heart attack Paternal Grandmother    Heart Problems Paternal Grandmother    COPD Neg Hx       Social History Social History   Tobacco Use   Smoking status: Never   Smokeless tobacco: Never  Vaping Use   Vaping Use: Never used  Substance Use Topics   Alcohol use: Yes    Alcohol/week: 1.0 standard drink of alcohol    Types: 1 Standard drinks or equivalent per week    Comment: Occasionally; less than 1-2 a week   Drug use: No        ROS   Physical Exam There were no vitals taken for this visit.   CONSTITUTIONAL: Well developed, and nourished, appropriately responsive and aware without distress. ***  EYES: Sclera non-icteric.   EARS, NOSE, MOUTH AND THROAT: Mask worn.  *** The oropharynx is clear. Oral mucosa is pink and moist.  Dentition: ***   Hearing is intact to voice.  NECK: Trachea is midline,  and there is no jugular venous distension.  LYMPH NODES:  Lymph nodes in the neck are not appreciated. RESPIRATORY:  Lungs are clear, and breath sounds are equal bilaterally. Normal respiratory effort without pathologic use of accessory muscles. CARDIOVASCULAR: Heart is regular in rate and rhythm.  Well perfused.  GI: The abdomen is *** soft, nontender, and nondistended. There were no palpable masses. I did not appreciate hepatosplenomegaly. There were normal bowel sounds. GU: *** MUSCULOSKELETAL:  Symmetrical muscle tone appreciated in all four extremities.    SKIN:  Skin turgor is normal. No pathologic skin lesions appreciated.  NEUROLOGIC:  Motor and sensation appear grossly normal.  Cranial nerves are grossly without defect. PSYCH:  Alert and oriented to person, place and time. Affect is appropriate for situation.  Data Reviewed I have personally reviewed what is currently available of the patient's imaging, recent labs and medical records.   Labs:     Latest Ref Rng & Units 11/20/2019    2:12 PM 11/08/2018    9:56 AM 08/03/2018    2:48 PM  CBC  WBC 3.4 - 10.8 x10E3/uL 7.7  8.6  10.9   Hemoglobin 11.1 - 15.9 g/dL 53.7  48.2  70.7   Hematocrit 34.0 - 46.6 % 40.2  39.5  38.1   Platelets 150 - 450 x10E3/uL 310  300.0  370.0       Latest Ref Rng & Units 01/08/2020    9:39 AM 11/20/2019    2:12 PM 08/09/2019   11:24 AM  CMP  Glucose 65 - 99 mg/dL 867  99  94   BUN 6 - 24 mg/dL 13  14  8    Creatinine 0.57 - 1.00 mg/dL  5.44  9.20   Sodium 134 - 144 mmol/L 140  140  138   Potassium 3.5 - 5.2 mmol/L 4.3  4.9  3.9   Chloride 96 - 106 mmol/L 105  104  102   CO2 20 - 29 mmol/L 26  23  23    Calcium 8.7 - 10.2 mg/dL 9.4  9.3  9.6   Total Protein 6.0 - 8.5 g/dL  7.0  6.8   Total Bilirubin 0.0 - 1.2 mg/dL  0.3  0.3   Alkaline Phos 48 - 121 IU/L  72  83   AST 0 - 40 IU/L  18  19   ALT 0 - 32 IU/L  18  16    *** {Labs :18171}  Imaging: Radiological images reviewed:  *** Within last 24  hrs: No results found.  Assessment    *** Patient Active Problem List   Diagnosis Date Noted   Right ovarian cyst 10/16/2020   History of uterine fibroid 06/18/2020   Vitamin D deficiency 11/20/2019   Screening for blood or protein in urine 09/17/2019   Prediabetes 09/17/2019   GERD (gastroesophageal reflux disease) 05/14/2018   Essential hypertension 06/17/2017   Right knee pain 07/24/2015   PVC (premature ventricular contraction) 02/20/2015   Anxiety and depression 05/01/2013   Seasonal allergic rhinitis 06/28/2012   Morbid obesity (HCC) 06/28/2012   HTN (hypertension) 10/12/2011    Plan    ***  Face-to-face time spent with the patient and accompanying care providers(if present) was *** minutes, with more than 50% of the time spent counseling, educating, and coordinating care of the patient.    These notes generated with voice recognition software. I apologize for typographical errors.  06/30/2012 M.D., FACS 05/09/2022, 12:58 PM

## 2022-05-09 NOTE — Progress Notes (Deleted)
GYNECOLOGY ANNUAL PHYSICAL EXAM PROGRESS NOTE  Subjective:    Erica Ramos is a 47 y.o. G6P2012 female who presents for an annual exam. The patient has no complaints today. The patient {is/is not/has never been:13135} sexually active. The patient participates in regular exercise: {yes/no/not asked:9010}. Has the patient ever been transfused or tattooed?: {yes/no/not asked:9010}. The patient reports that there {is/is not:9024} domestic violence in her life.    Menstrual History: Menarche age: *** No LMP recorded.     Gynecologic History:  Contraception: {method:5051} History of STI's:  Last Pap: ***. Results were: {norm/abn:16337}.  ***Denies/Notes h/o abnormal pap smears. Last mammogram: ***. Results were: {norm/abn:16337}       OB History  Gravida Para Term Preterm AB Living  3 2 2  0 1 2  SAB IAB Ectopic Multiple Live Births  1 0 0 0 2    # Outcome Date GA Lbr Len/2nd Weight Sex Delivery Anes PTL Lv  3 Term 09/20/17 [redacted]w[redacted]d  10 lb 5.4 oz (4.69 kg) M CS-LTranv Spinal  LIV     Name: Zeigler,BOY Nissa     Apgar1: 9  Apgar5: 9  2 Term 06/05/16 [redacted]w[redacted]d  9 lb 1.3 oz (4.119 kg) M CS-LTranv EPI  LIV     Name: Pucci,BOY Larene     Apgar1: 9  Apgar5: 9  1 SAB             Past Medical History:  Diagnosis Date   Abscess of axilla, left 05/01/2013   Allergy    Anxiety    Boil, breast 10/22/2019   Bronchitis    chronic - has flare every December   Cervical polyp    hx of   Chest pain 05/01/2013   Delivered by cesarean section 06/05/2016   Depression, major, single episode, moderate (HCC) 04/19/2018   Environmental allergies    Genital warts    GERD (gastroesophageal reflux disease)    Per MD chart note 12/01/2010   Heart murmur    Hx of abnormal Pap smear 2012   Hypertension    Morbid obesity (HCC)    BMI 47.9   Persistent headaches    worst during menstrual cycle   PVC (premature ventricular contraction) 02/20/2015   STD (sexually transmitted disease)    hx  genital warts/?HSV   Vaginal bleeding 07/15/2018   Vaginal Pap smear, abnormal     Past Surgical History:  Procedure Laterality Date   CESAREAN SECTION N/A 06/05/2016   Procedure: CESAREAN SECTION;  Surgeon: 08/03/2016, MD;  Location: [facility];  Service: Obstetrics;  Laterality: N/A;   CESAREAN SECTION N/A 09/20/2017   Procedure: CESAREAN SECTION;  Surgeon: 09/22/2017, MD;  Location: [facility]TH[facility] SU[facility];  Service: Obstetrics;  Laterality: N/A;  EDD 10/02/17   COLPOSCOPY  2012   no treatment to cervix--pap smears reverted to normal   WISDOM TOOTH EXTRACTION      Family History  Problem Relation Age of Onset   Diabetes Mother    Hypertension Mother        Under control   Diabetes Maternal Aunt    Seizures Maternal Aunt    Diabetes Maternal Grandmother    Hypertension Maternal Grandmother    Heart Problems Maternal Grandmother    Alcohol abuse Father    Diabetes Father    Hypertension Sister    Diabetes Maternal Uncle    Hypertension Other    Obesity Other    Heart attack Paternal Grandmother    Heart Problems Paternal  Grandmother    COPD Neg Hx     Social History   Socioeconomic History   Marital status: Single    Spouse name: Not on file   Number of children: 0   Years of education: Not on file   Highest education level: Not on file  Occupational History    Employer: [employer]  Tobacco Use   Smoking status: Never   Smokeless tobacco: Never  Vaping Use   Vaping Use: Never used  Substance and Sexual Activity   Alcohol use: Yes    Alcohol/week: 1.0 standard drink of alcohol    Types: 1 Standard drinks or equivalent per week    Comment: Occasionally; less than 1-2 a week   Drug use: No   Sexual activity: Yes    Partners: Male    Birth control/protection: Condom    Comment: condoms sometimes  Other Topics Concern   Not on file  Social History Narrative   Exercise--  2-3 x a week for about 30 min- 60 min   Social Determinants of Health    Financial Resource Strain: Low Risk  (01/24/2020)   Overall Financial Resource Strain (CARDIA)    Difficulty of Paying Living Expenses: Not hard at all  Food Insecurity: Not on file  Transportation Needs: Not on file  Physical Activity: Insufficiently Active (01/24/2020)   Exercise Vital Sign    Days of Exercise per Week: 2 days    Minutes of Exercise per Session: 30 min  Stress: Not on file  Social Connections: Not on file  Intimate Partner Violence: Not on file    Current Outpatient Medications on File Prior to Visit  Medication Sig Dispense Refill   acetaminophen (TYLENOL) 500 MG tablet Take 500-1,000 mg by mouth daily as needed for headache.      amLODipine (NORVASC) 5 MG tablet Take 1 tablet (5 mg total) by mouth daily. 90 tablet 1   calcium carbonate (TUMS - DOSED IN MG ELEMENTAL CALCIUM) 500 MG chewable tablet Chew 3-4 tablets by mouth daily as needed for indigestion or heartburn.     carvedilol (COREG) 25 MG tablet Take 1 tablet (25 mg total) by mouth 2 (two) times daily. Please keep appointment on 05/19/22  for further refills / Final attempt. 60 tablet 0   cetirizine (ZYRTEC) 10 MG tablet Take 1 tablet (10 mg total) by mouth daily. 90 tablet 3   cyclobenzaprine (FLEXERIL) 5 MG tablet Take 1 tablet (5 mg total) by mouth 3 (three) times daily as needed. 15 tablet 0   furosemide (LASIX) 20 MG tablet Take 1 tablet (20 mg total) by mouth daily as needed. 90 tablet 3   GARLIC PO Take 1 capsule by mouth daily.     hydrochlorothiazide (HYDRODIURIL) 25 MG tablet Take 1 tablet (25 mg) by mouth once daily 90 tablet 1   HYDROcodone-acetaminophen (NORCO) 5-325 MG tablet Take 1 tablet by mouth 3 (three) times daily as needed. 10 tablet 0   losartan (COZAAR) 100 MG tablet Take 1 tablet (100 mg total) by mouth daily. 90 tablet 1   omeprazole (PRILOSEC) 20 MG capsule Take 1 capsule (20 mg total) by mouth daily. 30 capsule 5   OZEMPIC, 0.25 OR 0.5 MG/DOSE, 2 MG/1.5ML SOPN Inject 0.5 mg into the  skin once a week.     Prenatal Vit-Fe Fumarate-FA (MULTIVITAMIN-PRENATAL) 27-0.8 MG TABS tablet Take 1 tablet by mouth daily.      sertraline (ZOLOFT) 50 MG tablet Take 1 tablet (50 mg total) by  mouth daily. Take 1/2 tab daily for 1 week and then go to 1 tab daily if tolerated. 90 tablet 3   triamcinolone (NASACORT AQ) 55 MCG/ACT AERO nasal inhaler Place 2 sprays into the nose daily. 1 Inhaler 12   triamcinolone cream (KENALOG) 0.1 % Apply 1 application topically 2 (two) times daily as needed. 30 g 0   No current facility-administered medications on file prior to visit.    Allergies  Allergen Reactions   Bacitracin-Neomycin-Polymyxin Swelling    Facial swelling with topical Neosporin   Neosporin [Neomycin-Bacitracin Zn-Polymyx] Anaphylaxis   Penicillins Nausea And Vomiting, Rash and Other (See Comments)    Has patient had a PCN reaction causing immediate rash, facial/tongue/throat swelling, SOB or lightheadedness with hypotension: Yes Has patient had a PCN reaction causing severe rash involving mucus membranes or skin necrosis: Yes Has patient had a PCN reaction that required hospitalization Yes Has patient had a PCN reaction occurring within the last 10 years: No If all of the above answers are "NO", then may proceed with Cephalosporin use.  fever Has patient had a PCN reaction causing immediate rash, facial/tongue/throat swelling, SOB or lightheadedness with hypotension: Yes Has patient had a PCN reaction causing severe rash involving mucus membranes or skin necrosis: Yes Has patient had a PCN reaction that required hospitalization Yes   Strawberry Flavor      Review of Systems Constitutional: negative for chills, fatigue, fevers and sweats Eyes: negative for irritation, redness and visual disturbance Ears, nose, mouth, throat, and face: negative for hearing loss, nasal congestion, snoring and tinnitus Respiratory: negative for asthma, cough, sputum Cardiovascular: negative for  chest pain, dyspnea, exertional chest pressure/discomfort, irregular heart beat, palpitations and syncope Gastrointestinal: negative for abdominal pain, change in bowel habits, nausea and vomiting Genitourinary: negative for abnormal menstrual periods, genital lesions, sexual problems and vaginal discharge, dysuria and urinary incontinence Integument/breast: negative for breast lump, breast tenderness and nipple discharge Hematologic/lymphatic: negative for bleeding and easy bruising Musculoskeletal:negative for back pain and muscle weakness Neurological: negative for dizziness, headaches, vertigo and weakness Endocrine: negative for diabetic symptoms including polydipsia, polyuria and skin dryness Allergic/Immunologic: negative for hay fever and urticaria      Objective:  There were no vitals taken for this visit. There is no height or weight on file to calculate BMI.    General Appearance:    Alert, cooperative, no distress, appears stated age  Head:    Normocephalic, without obvious abnormality, atraumatic  Eyes:    PERRL, conjunctiva/corneas clear, EOM's intact, both eyes  Ears:    Normal external ear canals, both ears  Nose:   Nares normal, septum midline, mucosa normal, no drainage or sinus tenderness  Throat:   Lips, mucosa, and tongue normal; teeth and gums normal  Neck:   Supple, symmetrical, trachea midline, no adenopathy; thyroid: no enlargement/tenderness/nodules; no carotid bruit or JVD  Back:     Symmetric, no curvature, ROM normal, no CVA tenderness  Lungs:     Clear to auscultation bilaterally, respirations unlabored  Chest Wall:    No tenderness or deformity   Heart:    Regular rate and rhythm, S1 and S2 normal, no murmur, rub or gallop  Breast Exam:    No tenderness, masses, or nipple abnormality  Abdomen:     Soft, non-tender, bowel sounds active all four quadrants, no masses, no organomegaly.    Genitalia:    Pelvic:external genitalia normal, vagina without lesions,  discharge, or tenderness, rectovaginal septum  normal. Cervix normal in appearance,  no cervical motion tenderness, no adnexal masses or tenderness.  Uterus normal size, shape, mobile, regular contours, nontender.  Rectal:    Normal external sphincter.  No hemorrhoids appreciated. Internal exam not done.   Extremities:   Extremities normal, atraumatic, no cyanosis or edema  Pulses:   2+ and symmetric all extremities  Skin:   Skin color, texture, turgor normal, no rashes or lesions  Lymph nodes:   Cervical, supraclavicular, and axillary nodes normal  Neurologic:   CNII-XII intact, normal strength, sensation and reflexes throughout   .  Labs:  Lab Results  Component Value Date   WBC 7.7 11/20/2019   HGB 12.7 11/20/2019   HCT 40.2 11/20/2019   MCV 80 11/20/2019   PLT 310 11/20/2019    Lab Results  Component Value Date   CREATININE 0.80 01/08/2020   BUN 13 01/08/2020   NA 140 01/08/2020   K 4.3 01/08/2020   CL 105 01/08/2020   CO2 26 01/08/2020    Lab Results  Component Value Date   ALT 18 11/20/2019   AST 18 11/20/2019   ALKPHOS 72 11/20/2019   BILITOT 0.3 11/20/2019    Lab Results  Component Value Date   TSH 2.060 08/09/2019     Assessment:   No diagnosis found.   Plan:  Blood tests: {blood tests:13147}. Breast self exam technique reviewed and patient encouraged to perform self-exam monthly. Contraception: {contraceptive methods:5051}. Discussed healthy lifestyle modifications. Mammogram {discussed/ordered:14545} Pap smear {discussed/ordered:14545}. COVID vaccination status: Follow up in 1 year for annual exam   Tommie Raymond, CMA [facility]

## 2022-05-09 NOTE — Patient Instructions (Incomplete)

## 2022-05-10 ENCOUNTER — Ambulatory Visit: Payer: BC Managed Care – PPO | Admitting: Surgery

## 2022-05-10 ENCOUNTER — Ambulatory Visit: Payer: BC Managed Care – PPO | Admitting: Obstetrics and Gynecology

## 2022-05-10 DIAGNOSIS — Z01419 Encounter for gynecological examination (general) (routine) without abnormal findings: Secondary | ICD-10-CM

## 2022-05-10 DIAGNOSIS — Z1231 Encounter for screening mammogram for malignant neoplasm of breast: Secondary | ICD-10-CM

## 2022-05-19 ENCOUNTER — Ambulatory Visit: Payer: BC Managed Care – PPO | Admitting: Cardiology

## 2022-05-23 ENCOUNTER — Telehealth: Payer: Self-pay | Admitting: Cardiology

## 2022-05-23 ENCOUNTER — Other Ambulatory Visit: Payer: Self-pay | Admitting: Cardiology

## 2022-05-23 ENCOUNTER — Other Ambulatory Visit: Payer: Self-pay

## 2022-05-23 MED ORDER — AMLODIPINE BESYLATE 5 MG PO TABS: 5.0000 mg | ORAL_TABLET | Freq: Every day | ORAL | 0 refills | Status: DC

## 2022-05-23 MED ORDER — CARVEDILOL 25 MG PO TABS: 25.0000 mg | ORAL_TABLET | Freq: Two times a day (BID) | ORAL | 0 refills | Status: DC

## 2022-05-23 NOTE — Telephone Encounter (Signed)
*  STAT* If patient is at the pharmacy, call can be transferred to refill team.   1. Which medications need to be refilled? (please list name of each medication and dose if known)   carvedilol (COREG) 25 MG tablet    2. Which pharmacy/location (including street and city if local pharmacy) is medication to be sent to? [id number] [address], [place]   3. Do they need a 30 day or 90 day supply? 90  Out of medication

## 2022-05-24 ENCOUNTER — Ambulatory Visit: Payer: BC Managed Care – PPO | Admitting: Obstetrics & Gynecology

## 2022-05-24 ENCOUNTER — Ambulatory Visit: Payer: BC Managed Care – PPO | Admitting: Surgery

## 2022-05-24 NOTE — Telephone Encounter (Signed)
Medication was sent in 05/23/2021 for 30 days due to appt cancellations every time a refill is sent. We will refill at patients next follow up appt 06/08/2022    Disp Refills Start End   carvedilol (COREG) 25 MG tablet 32 tablet 0 05/23/2022    Sig - Route: Take 1 tablet (25 mg total) by mouth 2 (two) times daily. NO ADDITIONAL REFILL WITHOUT OFFICE VISIT 05-23-22 - Oral   Sent to pharmacy as: carvedilol (COREG) 25 MG tablet   E-Prescribing Status: Receipt confirmed by pharmacy (05/23/2022  4:51 PM EST)    Pharmacy  [id number] - [place], [place]

## 2022-06-08 ENCOUNTER — Ambulatory Visit: Payer: BC Managed Care – PPO | Attending: Physician Assistant | Admitting: Cardiology

## 2022-06-08 ENCOUNTER — Encounter: Payer: Self-pay | Admitting: Cardiology

## 2022-06-08 VITALS — BP 124/84 | HR 58 | Ht 63.0 in | Wt 285.5 lb

## 2022-06-08 DIAGNOSIS — I10 Essential (primary) hypertension: Secondary | ICD-10-CM | POA: Diagnosis not present

## 2022-06-08 NOTE — Progress Notes (Signed)
Cardiology Office Note:    Date:  06/08/2022   ID:  Erica Ramos, DOB [birth year], MRN [id number]  PCP:  Mechele Claude, [facility] Cardiologist:  Kate Sable, MD  [place] Electrophysiologist:  None   Referring MD: Mechele Claude, FNP   Chief Complaint  Patient presents with   Other    3 month f/u no complaints today. Meds reviewed verbally with pt.    History of Present Illness:    Erica Ramos is a 47 y.o. female with a hx of hypertension, morbid obesity who presents for follow-up.    Compliant with medications as prescribed, no side effects.  She is switched jobs, needs preapproval for Ozempic to be approved, he is trying to eat healthier in order to lose weight.  Overall doing well, no new concerns at this time.  Currently is a Patent examiner.  Prior notes Echo 03/2015 EF 55 to 60%  Past Medical History:  Diagnosis Date   Abscess of axilla, left 05/01/2013   Allergy    Anxiety    Boil, breast 10/22/2019   Bronchitis    chronic - has flare every December   Cervical polyp    hx of   Chest pain 05/01/2013   Delivered by cesarean section 06/05/2016   Depression, major, single episode, moderate ([place]) 04/19/2018   Environmental allergies    Genital warts    GERD (gastroesophageal reflux disease)    Per MD chart note 12/01/2010   Heart murmur    Hx of abnormal Pap smear 2012   Hypertension    Morbid obesity ([place])    BMI 47.9   Persistent headaches    worst during menstrual cycle   PVC (premature ventricular contraction) 02/20/2015   STD (sexually transmitted disease)    hx genital warts/?HSV   Vaginal bleeding 07/15/2018   Vaginal Pap smear, abnormal     Past Surgical History:  Procedure Laterality Date   CESAREAN SECTION N/A 06/05/2016   Procedure: CESAREAN SECTION;  Surgeon: Waymon Amato, MD;  Location: [facility];  Service: Obstetrics;  Laterality: N/A;   CESAREAN SECTION N/A 09/20/2017   Procedure: CESAREAN SECTION;   Surgeon: Christophe Louis, MD;  Location: [facility];  Service: Obstetrics;  Laterality: N/A;  EDD 10/02/17   COLPOSCOPY  2012   no treatment to cervix--pap smears reverted to normal   WISDOM TOOTH EXTRACTION      Current Medications: Current Meds  Medication Sig   acetaminophen (TYLENOL) 500 MG tablet Take 500-1,000 mg by mouth daily as needed for headache.    amLODipine (NORVASC) 5 MG tablet Take 1 tablet (5 mg total) by mouth daily.   calcium carbonate (TUMS - DOSED IN MG ELEMENTAL CALCIUM) 500 MG chewable tablet Chew 3-4 tablets by mouth daily as needed for indigestion or heartburn.   carvedilol (COREG) 25 MG tablet Take 1 tablet (25 mg total) by mouth 2 (two) times daily. NO ADDITIONAL REFILL WITHOUT OFFICE VISIT 05-23-22   cetirizine (ZYRTEC) 10 MG tablet Take 1 tablet (10 mg total) by mouth daily.   GARLIC PO Take 1 capsule by mouth daily.   hydrochlorothiazide (HYDRODIURIL) 25 MG tablet Take 1 tablet (25 mg) by mouth once daily   losartan (COZAAR) 100 MG tablet Take 1 tablet (100 mg total) by mouth daily.   OZEMPIC, 0.25 OR 0.5 MG/DOSE, 2 MG/1.5ML SOPN Inject 0.5 mg into the skin once a week.   Prenatal Vit-Fe Fumarate-FA (MULTIVITAMIN-PRENATAL) 27-0.8 MG TABS tablet Take  1 tablet by mouth daily.    triamcinolone (NASACORT AQ) 55 MCG/ACT AERO nasal inhaler Place 2 sprays into the nose daily.   triamcinolone cream (KENALOG) 0.1 % Apply 1 application topically 2 (two) times daily as needed.     Allergies:   Bacitracin-neomycin-polymyxin, Neosporin [neomycin-bacitracin zn-polymyx], Penicillins, and Strawberry flavor   Social History   Socioeconomic History   Marital status: Single    Spouse name: Not on file   Number of children: 0   Years of education: Not on file   Highest education level: Not on file  Occupational History    Employer: [employer]  Tobacco Use   Smoking status: Never   Smokeless tobacco: Never  Vaping Use   Vaping Use: Never used  Substance and  Sexual Activity   Alcohol use: Yes    Alcohol/week: 1.0 standard drink of alcohol    Types: 1 Standard drinks or equivalent per week    Comment: Occasionally; less than 1-2 a week   Drug use: No   Sexual activity: Yes    Partners: Male    Birth control/protection: Condom    Comment: condoms sometimes  Other Topics Concern   Not on file  Social History Narrative   Exercise--  2-3 x a week for about 30 min- 60 min   Social Determinants of Health   Financial Resource Strain: Low Risk  (01/24/2020)   Overall Financial Resource Strain (CARDIA)    Difficulty of Paying Living Expenses: Not hard at all  Food Insecurity: Not on file  Transportation Needs: Not on file  Physical Activity: Insufficiently Active (01/24/2020)   Exercise Vital Sign    Days of Exercise per Week: 2 days    Minutes of Exercise per Session: 30 min  Stress: Not on file  Social Connections: Not on file     Family History: The patient's family history includes Alcohol abuse in her father; Diabetes in her father, maternal aunt, maternal grandmother, maternal uncle, and mother; Heart Problems in her maternal grandmother and paternal grandmother; Heart attack in her paternal grandmother; Hypertension in her maternal grandmother, mother, sister, and another family member; Obesity in an other family member; Seizures in her maternal aunt. There is no history of COPD.  ROS:   Please see the history of present illness.     All other systems reviewed and are negative.  EKGs/Labs/Other Studies Reviewed:    The following studies were reviewed today:   EKG:  EKG is ordered today.  EKG shows sinus bradycardia, heart rate 58  Recent Labs: No results found for requested labs within last 365 days.  Recent Lipid Panel    Component Value Date/Time   CHOL 137 01/08/2020 0939   TRIG 80 01/08/2020 0939   HDL 54 01/08/2020 0939   CHOLHDL 2.5 01/08/2020 0939   CHOLHDL 3 03/15/2019 1135   VLDL 17.4 03/15/2019 1135   LDLCALC  67 01/08/2020 0939    Physical Exam:    VS:  BP 124/84 (BP Location: Left Arm, Patient Position: Sitting, Cuff Size: Large)   Pulse (!) 58   Ht 5\' 3"  (1.6 m)   Wt 285 lb 8 oz (129.5 kg)   SpO2 99%   BMI 50.57 kg/m     Wt Readings from Last 3 Encounters:  06/08/22 285 lb 8 oz (129.5 kg)  06/14/21 278 lb (126.1 kg)  12/28/20 295 lb (133.8 kg)     GEN:  Well nourished, well developed in no acute distress HEENT: Normal NECK: No  JVD; No carotid bruits CARDIAC: RRR, no murmurs, rubs, gallops RESPIRATORY:  Clear to auscultation without rales, wheezing or rhonchi  ABDOMEN: Soft, non-tender, distended MUSCULOSKELETAL:  1+ edema; No deformity  SKIN: Warm and dry NEUROLOGIC:  Alert and oriented x 3 PSYCHIATRIC:  Normal affect   ASSESSMENT:    1. Primary hypertension   2. Morbid obesity ([place])    PLAN:    In order of problems listed above:  Hypertension, BP  controlled.  Continue amlodipine, losartan 100 mg daily, Coreg 25 mg twice daily, HCTZ 25 mg daily. Morbid obesity, Continue low-calorie diet, weight loss.  Refer to dietitian to help with low calorie diet planning.  Follow-up in 6 months.   Medication Adjustments/Labs and Tests Ordered: Current medicines are reviewed at length with the patient today.  Concerns regarding medicines are outlined above.  Orders Placed This Encounter  Procedures   Amb ref to Medical Nutrition Therapy-MNT   EKG 12-Lead    No orders of the defined types were placed in this encounter.    Patient Instructions  Medication Instructions:   Your physician recommends that you continue on your current medications as directed. Please refer to the Current Medication list given to you today.  *If you need a refill on your cardiac medications before your next appointment, please call your pharmacy*   Lab Work:  None ordered  If you have labs (blood work) drawn today and your tests are completely normal, you will receive your results only  by: [place] (if you have MyChart) OR A paper copy in the mail If you have any lab test that is abnormal or we need to change your treatment, we will call you to review the results.   Testing/Procedures:  None Ordered   Follow-Up: At [facility], you and your health needs are our priority.  As part of our continuing mission to provide you with exceptional heart care, we have created designated Provider Care Teams.  These Care Teams include your primary Cardiologist (physician) and Advanced Practice Providers (APPs -  Physician Assistants and Nurse Practitioners) who all work together to provide you with the care you need, when you need it.  We recommend signing up for the patient portal called "MyChart".  Sign up information is provided on this After Visit Summary.  MyChart is used to connect with patients for Virtual Visits (Telemedicine).  Patients are able to view lab/test results, encounter notes, upcoming appointments, etc.  Non-urgent messages can be sent to your provider as well.   To learn more about what you can do with MyChart, go to [email or web address].    Your next appointment:   6 month(s)  Provider:   You may see Kate Sable, MD or one of the following Advanced Practice Providers on your designated Care Team:   Murray Hodgkins, NP Christell Faith, PA-C Cadence Kathlen Mody, PA-C Gerrie Nordmann, NP     Signed, Kate Sable, MD  06/08/2022 9:49 AM    [place]

## 2022-06-08 NOTE — Patient Instructions (Signed)
Medication Instructions:   Your physician recommends that you continue on your current medications as directed. Please refer to the Current Medication list given to you today. *If you need a refill on your cardiac medications before your next appointment, please call your pharmacy*   Lab Work:  None ordered  If you have labs (blood work) drawn today and your tests are completely normal, you will receive your results only by: MyChart Message (if you have MyChart) OR A paper copy in the mail If you have any lab test that is abnormal or we need to change your treatment, we will call you to review the results.   Testing/Procedures:  None Ordered    Follow-Up: At [facility], you and your health needs are our priority.  As part of our continuing mission to provide you with exceptional heart care, we have created designated Provider Care Teams.  These Care Teams include your primary Cardiologist (physician) and Advanced Practice Providers (APPs -  Physician Assistants and Nurse Practitioners) who all work together to provide you with the care you need, when you need it.  We recommend signing up for the patient portal called "MyChart".  Sign up information is provided on this After Visit Summary.  MyChart is used to connect with patients for Virtual Visits (Telemedicine).  Patients are able to view lab/test results, encounter notes, upcoming appointments, etc.  Non-urgent messages can be sent to your provider as well.   To learn more about what you can do with MyChart, go to [email or web address].    Your next appointment:   6 month(s)  Provider:   You may see Brian Agbor-Etang, MD or one of the following Advanced Practice Providers on your designated Care Team:   Christopher Berge, NP Ryan Dunn, PA-C Cadence Furth, PA-C Sheri Hammock, NP   

## 2022-06-09 ENCOUNTER — Ambulatory Visit: Payer: BC Managed Care – PPO | Admitting: Surgery

## 2022-06-13 ENCOUNTER — Other Ambulatory Visit: Payer: Self-pay

## 2022-06-13 DIAGNOSIS — I10 Essential (primary) hypertension: Secondary | ICD-10-CM

## 2022-06-13 MED ORDER — HYDROCHLOROTHIAZIDE 25 MG PO TABS: ORAL_TABLET | ORAL | 1 refills | Status: DC

## 2022-06-14 ENCOUNTER — Ambulatory Visit: Payer: BC Managed Care – PPO | Admitting: Obstetrics & Gynecology

## 2022-06-16 ENCOUNTER — Other Ambulatory Visit: Payer: Self-pay

## 2022-06-16 ENCOUNTER — Ambulatory Visit: Payer: BC Managed Care – PPO | Admitting: Family

## 2022-06-16 MED ORDER — CARVEDILOL 25 MG PO TABS: 25.0000 mg | ORAL_TABLET | Freq: Two times a day (BID) | ORAL | 5 refills | Status: DC

## 2022-07-05 ENCOUNTER — Ambulatory Visit (INDEPENDENT_AMBULATORY_CARE_PROVIDER_SITE_OTHER): Payer: BC Managed Care – PPO | Admitting: Family

## 2022-07-05 ENCOUNTER — Encounter: Payer: Self-pay | Admitting: Family

## 2022-07-05 VITALS — BP 138/82 | HR 88 | Ht 64.0 in | Wt 278.0 lb

## 2022-07-05 DIAGNOSIS — E559 Vitamin D deficiency, unspecified: Secondary | ICD-10-CM

## 2022-07-05 DIAGNOSIS — F4321 Adjustment disorder with depressed mood: Secondary | ICD-10-CM

## 2022-07-05 DIAGNOSIS — E782 Mixed hyperlipidemia: Secondary | ICD-10-CM

## 2022-07-05 DIAGNOSIS — Z1231 Encounter for screening mammogram for malignant neoplasm of breast: Secondary | ICD-10-CM | POA: Diagnosis not present

## 2022-07-05 DIAGNOSIS — R7303 Prediabetes: Secondary | ICD-10-CM

## 2022-07-05 MED ORDER — SERTRALINE HCL 50 MG PO TABS: 50.0000 mg | ORAL_TABLET | Freq: Every day | ORAL | 1 refills | Status: DC

## 2022-07-05 MED ORDER — OZEMPIC (1 MG/DOSE) 4 MG/3ML ~~LOC~~ SOPN: 1.0000 mg | PEN_INJECTOR | SUBCUTANEOUS | 3 refills | Status: DC

## 2022-07-05 MED ORDER — CLONAZEPAM 0.5 MG PO TABS: 0.5000 mg | ORAL_TABLET | Freq: Two times a day (BID) | ORAL | 0 refills | Status: DC | PRN

## 2022-07-08 ENCOUNTER — Encounter: Payer: Self-pay | Admitting: Family

## 2022-07-08 NOTE — Progress Notes (Unsigned)
Established Patient Office Visit  Subjective:  Patient ID: Erica Ramos, female    DOB: [birth year]  Age: 47 y.o. MRN: [id number]  Chief Complaint  Patient presents with   Follow-up    Follow up    Patient here today for follow up.  She had been doing well, but is currently having an increase in her depressive symptoms as she recently lost a cousin with whom she was extremely close.  She asks if we can restart her zoloft, as she had previously stopped taking this.   Also has been having some issues with       Past Medical History:  Diagnosis Date   Abscess of axilla, left 05/01/2013   Allergy    Anxiety    Boil, breast 10/22/2019   Bronchitis    chronic - has flare every December   Cervical polyp    hx of   Chest pain 05/01/2013   Delivered by cesarean section 06/05/2016   Depression, major, single episode, moderate ([place]) 04/19/2018   Environmental allergies    Genital warts    GERD (gastroesophageal reflux disease)    Per MD chart note 12/01/2010   Heart murmur    History of uterine fibroid 06/18/2020   Exophytic/Pedunculated   Hx of abnormal Pap smear 2012   Hypertension    Morbid obesity ([place])    BMI 47.9   Persistent headaches    worst during menstrual cycle   PVC (premature ventricular contraction) 02/20/2015   Screening for blood or protein in urine 09/17/2019   STD (sexually transmitted disease)    hx genital warts/?HSV   Vaginal bleeding 07/15/2018   Vaginal Pap smear, abnormal     Past Surgical History:  Procedure Laterality Date   CESAREAN SECTION N/A 06/05/2016   Procedure: CESAREAN SECTION;  Surgeon: Waymon Amato, MD;  Location: [place] [facility];  Service: Obstetrics;  Laterality: N/A;   CESAREAN SECTION N/A 09/20/2017   Procedure: CESAREAN SECTION;  Surgeon: Christophe Louis, MD;  Location: [place];  Service: Obstetrics;  Laterality: N/A;  EDD 10/02/17   COLPOSCOPY  2012   no treatment to cervix--pap smears reverted to normal   WISDOM  TOOTH EXTRACTION      Social History   Socioeconomic History   Marital status: Single    Spouse name: Not on file   Number of children: 0   Years of education: Not on file   Highest education level: Not on file  Occupational History    Employer: [employer]  Tobacco Use   Smoking status: Never   Smokeless tobacco: Never  Vaping Use   Vaping Use: Never used  Substance and Sexual Activity   Alcohol use: Yes    Alcohol/week: 1.0 standard drink of alcohol    Types: 1 Standard drinks or equivalent per week    Comment: Occasionally; less than 1-2 a week   Drug use: No   Sexual activity: Yes    Partners: Male    Birth control/protection: Condom    Comment: condoms sometimes  Other Topics Concern   Not on file  Social History Narrative   Exercise--  2-3 x a week for about 30 min- 60 min   Social Determinants of Health   Financial Resource Strain: Low Risk  (01/24/2020)   Overall Financial Resource Strain (CARDIA)    Difficulty of Paying Living Expenses: Not hard at all  Food Insecurity: Not on file  Transportation Needs: Not on file  Physical Activity: Insufficiently Active (01/24/2020)  Exercise Vital Sign    Days of Exercise per Week: 2 days    Minutes of Exercise per Session: 30 min  Stress: Not on file  Social Connections: Not on file  Intimate Partner Violence: Not on file    Family History  Problem Relation Age of Onset   Diabetes Mother    Hypertension Mother        Under control   Diabetes Maternal Aunt    Seizures Maternal Aunt    Diabetes Maternal Grandmother    Hypertension Maternal Grandmother    Heart Problems Maternal Grandmother    Alcohol abuse Father    Diabetes Father    Hypertension Sister    Diabetes Maternal Uncle    Hypertension Other    Obesity Other    Heart attack Paternal Grandmother    Heart Problems Paternal Grandmother    COPD Neg Hx     Allergies  Allergen Reactions   Bacitracin-Neomycin-Polymyxin Swelling     Facial swelling with topical Neosporin   Neosporin [Neomycin-Bacitracin Zn-Polymyx] Anaphylaxis   Penicillins Nausea And Vomiting, Rash and Other (See Comments)    Has patient had a PCN reaction causing immediate rash, facial/tongue/throat swelling, SOB or lightheadedness with hypotension: Yes Has patient had a PCN reaction causing severe rash involving mucus membranes or skin necrosis: Yes Has patient had a PCN reaction that required hospitalization Yes Has patient had a PCN reaction occurring within the last 10 years: No If all of the above answers are "NO", then may proceed with Cephalosporin use.  fever Has patient had a PCN reaction causing immediate rash, facial/tongue/throat swelling, SOB or lightheadedness with hypotension: Yes Has patient had a PCN reaction causing severe rash involving mucus membranes or skin necrosis: Yes Has patient had a PCN reaction that required hospitalization Yes   Strawberry Flavor     ROS     Objective:   BP 138/82   Pulse 88   Ht '5\' 4"'$  (1.626 m)   Wt 278 lb (126.1 kg)   SpO2 98%   BMI 47.72 kg/m   Vitals:   07/05/22 1144  BP: 138/82  Pulse: 88  Height: '5\' 4"'$  (1.626 m)  Weight: 278 lb (126.1 kg)  SpO2: 98%  BMI (Calculated): 47.7    Physical Exam   No results found for any visits on 07/05/22.  No results found for this or any previous visit (from the past 2160 hour(s)).    Assessment & Plan:   Problem List Items Addressed This Visit     Prediabetes   Other Visit Diagnoses     Grief    -  Primary   Relevant Medications   sertraline (ZOLOFT) 50 MG tablet   clonazePAM (KLONOPIN) 0.5 MG tablet   Screening mammogram for breast cancer       Relevant Orders   MM 3D SCREENING MAMMOGRAM BILATERAL BREAST   Mixed hyperlipidemia       Vitamin D deficiency, unspecified           No follow-ups on file.   Total time spent: {AMA time spent:29001} minutes  Mechele Claude, FNP  07/05/2022

## 2022-07-25 ENCOUNTER — Other Ambulatory Visit: Payer: Self-pay

## 2022-07-25 MED ORDER — LOSARTAN POTASSIUM 100 MG PO TABS: 100.0000 mg | ORAL_TABLET | Freq: Every day | ORAL | 0 refills | Status: DC

## 2022-07-27 ENCOUNTER — Ambulatory Visit: Payer: BC Managed Care – PPO | Admitting: Dietician

## 2022-07-28 ENCOUNTER — Ambulatory Visit: Payer: BC Managed Care – PPO | Admitting: Dietician

## 2022-09-20 ENCOUNTER — Other Ambulatory Visit: Payer: Self-pay

## 2022-09-20 MED ORDER — AMLODIPINE BESYLATE 5 MG PO TABS: 5.0000 mg | ORAL_TABLET | Freq: Every day | ORAL | 0 refills | Status: DC

## 2022-09-20 NOTE — Telephone Encounter (Signed)
Requested Prescriptions   Signed Prescriptions Disp Refills   amLODipine (NORVASC) 5 MG tablet 90 tablet 0    Sig: Take 1 tablet (5 mg total) by mouth daily.    Authorizing Provider: Debbe Odea    Ordering User: Guerry Minors

## 2022-09-21 ENCOUNTER — Other Ambulatory Visit: Payer: Self-pay

## 2022-09-21 MED ORDER — CARVEDILOL 25 MG PO TABS: 25.0000 mg | ORAL_TABLET | Freq: Two times a day (BID) | ORAL | 2 refills | Status: DC

## 2022-10-16 ENCOUNTER — Other Ambulatory Visit: Payer: Self-pay | Admitting: Family

## 2022-10-17 ENCOUNTER — Other Ambulatory Visit: Payer: Self-pay

## 2022-10-17 MED ORDER — LOSARTAN POTASSIUM 100 MG PO TABS: 100.0000 mg | ORAL_TABLET | Freq: Every day | ORAL | 0 refills | Status: DC

## 2022-10-19 ENCOUNTER — Ambulatory Visit: Payer: BC Managed Care – PPO | Admitting: Dietician

## 2022-10-25 ENCOUNTER — Ambulatory Visit
Admission: RE | Admit: 2022-10-25 | Discharge: 2022-10-25 | Disposition: A | Discharge status: Home / Self Care | Payer: BC Managed Care – PPO | Source: Ambulatory Visit | Attending: Family | Admitting: Family

## 2022-10-25 DIAGNOSIS — Z1231 Encounter for screening mammogram for malignant neoplasm of breast: Secondary | ICD-10-CM | POA: Diagnosis present

## 2022-10-27 ENCOUNTER — Encounter: Payer: BC Managed Care – PPO | Attending: Physician Assistant | Admitting: Dietician

## 2022-10-27 ENCOUNTER — Encounter: Payer: Self-pay | Admitting: Dietician

## 2022-10-27 DIAGNOSIS — Z6841 Body Mass Index (BMI) 40.0 and over, adult: Secondary | ICD-10-CM

## 2022-10-27 NOTE — Patient Instructions (Addendum)
Choose frozen vegetables for a low cost, healthy option that will last.  Work on finding ways to reduce your stress as often as possible.  Keep the "Behavior Chain" in mind! When you find yourself eating emotionally, really think about the events of the day that got you to the point where you stress eat.

## 2022-10-27 NOTE — Progress Notes (Signed)
Medical Nutrition Therapy  Appointment Start time:  1130  Appointment End time:  1230  Primary concerns today: Weight Loss  Referral diagnosis: E66.01 - Morbid Obesity Preferred learning style: Visual, hands on Learning readiness: Contemplating   NUTRITION ASSESSMENT   Anthropometrics  Ht:5'3" Wt: 281.2 lbs BMI: 49.81 kg/m2  Clinical Medical Hx: HTN, Prediabetes, Anxiety, Depression Medications: Ozempic, Sertraline, Amlodipine, Carvedilol, HCTZ Labs: N/A Notable Signs/Symptoms: N/A  Lifestyle & Dietary Hx Pt reports a goal of weight loss and blood pressure medication reduction. Pt reports bouts of feeling low motivation, describes it as slight depression, at times. Pt states this happens when they feel like they have given everything to others and has no time for themselves. Pt reports they will emotionally eat during these times. Pt reports desire to establish plan to improve relationship with foods in times of stress, states that they tend to go for sweets (M&M's, Brownies, Chips, Ice Cream) Pt reports having more difficulty adapting to adversity as they have gotten older. Pt reports they lost a long-term job and had  their second child at the same time, which changed their stress levels and it's impact on them. 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 Pt states it is fulfilling and challenging, which pt likes in a job. Pt reports having 2 kids, 5&6, and has them the majority of the time, father (separated) helps with child care regularly. Pt reports wanting to eat healthier on a budget, and wants their children to adopt healthy behaviors as well. Pt reports their kids were supposed to go to camp this summer, and they were planning on making some lifestyle changes at this time (group exercise classes, aerobic exercises, get a massage, mani/pedi, lunch alone), kids ended up not going. Pt reports [employer] for work at times, usually 3 times a week. Pt  states this is a low stress time, feels that it recharges them.  Estimated daily fluid intake:  Supplements: Prenatal MVI Sleep: Generally sleeps well, wakes up multiple times r/t kids, uses app to get 8 hrs Stress / self-care: Parenting causes stress (5 and 45 y/o boys) Current average weekly physical activity: ADLs  24-Hr Dietary Recall First Meal: Banana Snack: none Second Meal: Mindi Slicker, Cheeseburger, kids meal, Coke icee Snack: Ice water Third Meal: Baked BBQ chicken, rice, green beans, biscuit Snack: none Beverages: Water    NUTRITION DIAGNOSIS  NB-1.7 Undesireable food choices As related to stress.  As evidenced by emotional eating of energy dense/sweet foods, BMI of 49.81 kg/m2.   NUTRITION INTERVENTION  Nutrition education (E-1) on the following topics:  Educated pt on the role of stress on dietary choices. Educate pt on strategies to manage and reduce stress. Educate pt on   Handouts Provided Include  Behavior Chain worksheet  Learning Style & Readiness for Change Teaching method utilized: Visual & Auditory  Demonstrated degree of understanding via: Teach Back  Barriers to learning/adherence to lifestyle change: High stress  Goals Established by Pt Choose frozen vegetables for a low cost, healthy option that will last. Work on finding ways to reduce your stress as often as possible. Keep the "Behavior Chain" in mind! When you find yourself eating emotionally, really think about the events of the day that got you to the point where you stress eat.    MONITORING & EVALUATION Dietary intake, weekly physical activity, and stress reduction in 6 weeks.  Next Steps  Patient is to journal stress triggers and ideas to mitigate them, follow up  with RD.

## 2022-12-02 ENCOUNTER — Telehealth: Payer: Self-pay | Admitting: Family

## 2022-12-02 NOTE — Telephone Encounter (Signed)
Patient left VM that she is having congestion, sinus pain in her forehead, smell going in and out, and ears popping. Please advise on what she should do or if you can send something in please.

## 2022-12-05 ENCOUNTER — Ambulatory Visit: Payer: BC Managed Care – PPO | Attending: Cardiology | Admitting: Cardiology

## 2022-12-05 NOTE — Telephone Encounter (Signed)
Spoke with pt stated she is feeling better and able to smell again.

## 2022-12-06 ENCOUNTER — Telehealth: Payer: Self-pay

## 2022-12-06 ENCOUNTER — Encounter: Payer: Self-pay | Admitting: Cardiology

## 2022-12-06 NOTE — Telephone Encounter (Signed)
Patient called c/o congestion and sinus pressure and weird smells, patient will need to come get a covid test done per [facility]

## 2022-12-07 ENCOUNTER — Ambulatory Visit: Payer: BC Managed Care – PPO | Admitting: Dietician

## 2022-12-13 ENCOUNTER — Ambulatory Visit: Payer: BC Managed Care – PPO | Admitting: Obstetrics & Gynecology

## 2022-12-13 ENCOUNTER — Telehealth: Payer: Self-pay

## 2022-12-14 ENCOUNTER — Encounter: Payer: BC Managed Care – PPO | Attending: Physician Assistant | Admitting: Dietician

## 2022-12-14 ENCOUNTER — Encounter: Payer: Self-pay | Admitting: Dietician

## 2022-12-14 NOTE — Progress Notes (Signed)
Medical Nutrition Therapy: Visit start time: 1030  end time: 1100  Assessment:  Diagnosis: obesity Medical history changes: no changes Psychosocial issues/ stress concerns: history of high stress/ stress eating; anxiety  Medications, supplement changes: reconciled list in medical record   Current weight: 285.8lbs Height: 5'3" BMI: 50.61  Progress and evaluation:  Weight has increased 3-4lbs since previous visit on 10/27/22. Patient feels this could be due to increase in restaurant meals. She received help with emotional eating at previous visit, now wants a structured meal pattern to follow.  Wants to work on [employer] and weight loss    Dietary Intake:  Usual eating pattern includes 2-3 meals and 1 snacks per day. Dining out frequency: 3-5 meals per week.  Breakfast: [place] sausage/ bacon + eggs + bread or pancakes; cottage cheese or yogurt and fruit; sometimes fruit only if rushed ie grapes, banana Snack: none Lunch: sometimes skips; currently (summer) sandwich/ leftovers/ fast food ie [employer] or burger king or pizza. Used to drink a protein shake Snack: fruit ie watermelon, berries; crackers; tries to avoid sweets ie cookies/ ice cream/ [place] ice Supper: tacos with [place] or beef, beans, lettuce, cheese, salsa soft tortilla/ chicken fajitas/ chicken bbq or baked occ fried + veg brocc, gr beans, collards, spinach, kale, peas and carrots + rice jasmine/ sweet potato/ mac and cheese Snack: none Beverages: water with lemon and lime, occ soda  Physical activity: no regular exercise; occ walking at park  Intervention:   Nutrition Care Education:  Basic nutrition: reviewed appropriate nutrient balance; appropriate meal and snack schedule; general nutrition guidelines    Weight control: reviewed progress since previous visit; calculated energy needs for weight loss at about 1500kcal daily, provided meal plan with 45% CHO, 25% pro, and 30% fat; discussed portion control;  making sustainable changes and controlling food cravings; tracking food intake   Other Intervention Notes: Patient has been working on [employer]; challenge is schedule and childcare responsibilities.  Provided eating pattern for gradual weight loss. Established specific goals with input from patient.   Nutritional Diagnosis:  [place]-3.3 Overweight/obesity As related to history of excess calories, high stress level, inadequate physical activity.  As evidenced by patient with current BMI of 50.   Education Materials given:  Designer, industrial/product with food lists, sample meal pattern Sample menus Snacking handout Visit summary with goals/ instructions   Learner/ who was taught:  Patient   Level of understanding: Verbalizes/ demonstrates competency  Demonstrated degree of understanding via:   Teach back Learning barriers: None  Willingness to learn/ readiness for change: Eager, change in progress   Monitoring and Evaluation:  Dietary intake, exercise, and body weight      follow up:  02/27/23 at 2:00pm

## 2022-12-14 NOTE — Patient Instructions (Addendum)
Try tracking food intake using app such as MyFitnessPal or Lose It. Aim for about 1500 calories daily on average Limit sweets by keeping to 1 small serving a few times a week. Continue to eat fruit and nuts or yogurt for snacks most of the time.  Plan to eat a meal or snack every 3-5 hours during the day. OK to have a protein shake for a meal if rushed or not hungry.

## 2022-12-15 ENCOUNTER — Other Ambulatory Visit: Payer: Self-pay

## 2022-12-15 MED ORDER — OZEMPIC (2 MG/DOSE) 8 MG/3ML ~~LOC~~ SOPN: 2.0000 mg | PEN_INJECTOR | SUBCUTANEOUS | 2 refills | Status: DC

## 2022-12-16 NOTE — Telephone Encounter (Signed)
Spoke with pt who verbalized understanding.

## 2022-12-20 ENCOUNTER — Other Ambulatory Visit (HOSPITAL_COMMUNITY)
Admission: RE | Admit: 2022-12-20 | Payer: BC Managed Care – PPO | Source: Ambulatory Visit | Admitting: Obstetrics & Gynecology

## 2022-12-20 ENCOUNTER — Ambulatory Visit (INDEPENDENT_AMBULATORY_CARE_PROVIDER_SITE_OTHER): Payer: BC Managed Care – PPO | Admitting: Obstetrics & Gynecology

## 2022-12-20 VITALS — BP 149/90 | HR 76 | Ht 63.0 in | Wt 284.6 lb

## 2022-12-20 DIAGNOSIS — N852 Hypertrophy of uterus: Secondary | ICD-10-CM

## 2022-12-20 DIAGNOSIS — Z01419 Encounter for gynecological examination (general) (routine) without abnormal findings: Secondary | ICD-10-CM | POA: Insufficient documentation

## 2022-12-20 DIAGNOSIS — Z124 Encounter for screening for malignant neoplasm of cervix: Secondary | ICD-10-CM | POA: Insufficient documentation

## 2022-12-20 NOTE — Progress Notes (Signed)
GYNECOLOGY ANNUAL PHYSICAL EXAM PROGRESS NOTE  Subjective:    Erica Ramos is a 47 y.o. single [id number] ( 5 and 28 yo sons)  who presents for an annual exam. She is new to this practice.  Her issue today is that her period has changed some. Some days are heavier than others. She bleeds once per month and periods last 5-7 days. She is scheduled with her primary care provider to have routine blood work later this week.  The patient is sexually active. The patient participates in regular exercise: no. Has the patient ever been transfused or tattooed?: yes. The patient reports that there is not domestic violence in her life.    Menstrual History: Menarche age: 81 Patient's last menstrual period was 12/05/2022 (exact date).     Gynecologic History:  Contraception: rhythm method History of STI's:  Last Pap: 2022. Results were: normal.  Reports a h/o abnormal pap smears, had colpo in the past.  FH- no breast, gyn, colon cancers       OB History  Gravida Para Term Preterm AB Living  3 2 2  0 1 2  SAB IAB Ectopic Multiple Live Births  1 0 0 0 2    # Outcome Date GA Lbr Len/2nd Weight Sex Type Anes PTL Lv  3 Term 09/20/17 [redacted]w[redacted]d  10 lb 5.4 oz (4.69 kg) M CS-LTranv Spinal  LIV     Name: Fandrich,BOY Nyanna     Apgar1: 9  Apgar5: 9  2 Term 06/05/16 [redacted]w[redacted]d  9 lb 1.3 oz (4.119 kg) M CS-LTranv EPI  LIV     Name: Traum,BOY Traci     Apgar1: 9  Apgar5: 9  1 SAB             Past Medical History:  Diagnosis Date   Abscess of axilla, left 05/01/2013   Allergy    Anxiety    Boil, breast 10/22/2019   Bronchitis    chronic - has flare every December   Cervical polyp    hx of   Chest pain 05/01/2013   Delivered by cesarean section 06/05/2016   Depression, major, single episode, moderate (HCC) 04/19/2018   Environmental allergies    Genital warts    GERD (gastroesophageal reflux disease)    Per MD chart note 12/01/2010   Heart murmur    History of uterine fibroid 06/18/2020    Exophytic/Pedunculated   Hx of abnormal Pap smear 2012   Hypertension    Morbid obesity (HCC)    BMI 47.9   Persistent headaches    worst during menstrual cycle   PVC (premature ventricular contraction) 02/20/2015   Screening for blood or protein in urine 09/17/2019   STD (sexually transmitted disease)    hx genital warts/?HSV   Vaginal bleeding 07/15/2018   Vaginal Pap smear, abnormal     Past Surgical History:  Procedure Laterality Date   CESAREAN SECTION N/A 06/05/2016   Procedure: CESAREAN SECTION;  Surgeon: Hoover Browns, MD;  Location: [facility];  Service: Obstetrics;  Laterality: N/A;   CESAREAN SECTION N/A 09/20/2017   Procedure: CESAREAN SECTION;  Surgeon: Gerald Leitz, MD;  Location: [facility];  Service: Obstetrics;  Laterality: N/A;  EDD 10/02/17   COLPOSCOPY  2012   no treatment to cervix--pap smears reverted to normal   WISDOM TOOTH EXTRACTION      Family History  Problem Relation Age of Onset   Diabetes Mother    Hypertension Mother  Under control   Diabetes Maternal Aunt    Seizures Maternal Aunt    Diabetes Maternal Grandmother    Hypertension Maternal Grandmother    Heart Problems Maternal Grandmother    Alcohol abuse Father    Diabetes Father    Hypertension Sister    Diabetes Maternal Uncle    Hypertension Other    Obesity Other    Heart attack Paternal Grandmother    Heart Problems Paternal Grandmother    COPD Neg Hx     Social History   Socioeconomic History   Marital status: Single    Spouse name: Not on file   Number of children: 0   Years of education: Not on file   Highest education level: Not on file  Occupational History    Employer: [employer]  Tobacco Use   Smoking status: Never   Smokeless tobacco: Never  Vaping Use   Vaping status: Never Used  Substance and Sexual Activity   Alcohol use: Yes    Alcohol/week: 1.0 standard drink of alcohol    Types: 1 Standard drinks or equivalent per week     Comment: Occasionally; less than 1-2 a week   Drug use: No   Sexual activity: Yes    Partners: Male    Birth control/protection: Condom    Comment: condoms sometimes  Other Topics Concern   Not on file  Social History Narrative   Exercise--  2-3 x a week for about 30 min- 60 min   Social Determinants of Health   Financial Resource Strain: Low Risk  (01/24/2020)   Overall Financial Resource Strain (CARDIA)    Difficulty of Paying Living Expenses: Not hard at all  Food Insecurity: Not on file  Transportation Needs: Not on file  Physical Activity: Insufficiently Active (01/24/2020)   Exercise Vital Sign    Days of Exercise per Week: 2 days    Minutes of Exercise per Session: 30 min  Stress: Not on file  Social Connections: Not on file  Intimate Partner Violence: Not on file    Current Outpatient Medications on File Prior to Visit  Medication Sig Dispense Refill   amLODipine (NORVASC) 5 MG tablet Take 1 tablet (5 mg total) by mouth daily. 90 tablet 0   carvedilol (COREG) 25 MG tablet Take 1 tablet (25 mg total) by mouth 2 (two) times daily. 60 tablet 2   cetirizine (ZYRTEC) 10 MG tablet Take 1 tablet (10 mg total) by mouth daily. 90 tablet 3   GARLIC PO Take 1 capsule by mouth daily.     hydrochlorothiazide (HYDRODIURIL) 25 MG tablet Take 1 tablet (25 mg) by mouth once daily 90 tablet 1   losartan (COZAAR) 100 MG tablet Take 1 tablet (100 mg total) by mouth daily. 90 tablet 0   Prenatal Vit-Fe Fumarate-FA (MULTIVITAMIN-PRENATAL) 27-0.8 MG TABS tablet Take 1 tablet by mouth daily.      Semaglutide, 2 MG/DOSE, (OZEMPIC, 2 MG/DOSE,) 8 MG/3ML SOPN Inject 2 mg into the skin once a week. 3 mL 2   sertraline (ZOLOFT) 50 MG tablet Take 1 tablet (50 mg total) by mouth daily. 90 tablet 1   triamcinolone (NASACORT AQ) 55 MCG/ACT AERO nasal inhaler Place 2 sprays into the nose daily. 1 Inhaler 12   triamcinolone cream (KENALOG) 0.1 % Apply 1 application topically 2 (two) times daily as needed.  30 g 0   clonazePAM (KLONOPIN) 0.5 MG tablet Take 1 tablet (0.5 mg total) by mouth 2 (two) times daily as needed for  anxiety. (Patient not taking: Reported on 10/27/2022) 30 tablet 0   No current facility-administered medications on file prior to visit.    Allergies  Allergen Reactions   Bacitracin-Neomycin-Polymyxin Swelling    Facial swelling with topical Neosporin   Neosporin [Neomycin-Bacitracin Zn-Polymyx] Anaphylaxis   Penicillins Nausea And Vomiting, Rash and Other (See Comments)    Has patient had a PCN reaction causing immediate rash, facial/tongue/throat swelling, SOB or lightheadedness with hypotension: Yes Has patient had a PCN reaction causing severe rash involving mucus membranes or skin necrosis: Yes Has patient had a PCN reaction that required hospitalization Yes Has patient had a PCN reaction occurring within the last 10 years: No If all of the above answers are "NO", then may proceed with Cephalosporin use.  fever Has patient had a PCN reaction causing immediate rash, facial/tongue/throat swelling, SOB or lightheadedness with hypotension: Yes Has patient had a PCN reaction causing severe rash involving mucus membranes or skin necrosis: Yes Has patient had a PCN reaction that required hospitalization Yes   Strawberry Flavor      Review of Systems Constitutional: negative for chills, fatigue, fevers and sweats Eyes: negative for irritation, redness and visual disturbance Ears, nose, mouth, throat, and face: negative for hearing loss, nasal congestion, snoring and tinnitus Respiratory: negative for asthma, cough, sputum Cardiovascular: negative for chest pain, dyspnea, exertional chest pressure/discomfort, irregular heart beat, palpitations and syncope Gastrointestinal: negative for abdominal pain, change in bowel habits, nausea and vomiting Genitourinary: negative for abnormal menstrual periods, genital lesions, sexual problems and vaginal discharge, dysuria and  urinary incontinence Integument/breast: negative for breast lump, breast tenderness and nipple discharge Hematologic/lymphatic: negative for bleeding and easy bruising Musculoskeletal:negative for back pain and muscle weakness Neurological: negative for dizziness, headaches, vertigo and weakness Endocrine: negative for diabetic symptoms including polydipsia, polyuria and skin dryness Allergic/Immunologic: negative for hay fever and urticaria      Objective:  Blood pressure (!) 149/90, pulse 76, height 5\' 3"  (1.6 m), weight 284 lb 9.6 oz (129.1 kg), last menstrual period 12/05/2022. Body mass index is 50.41 kg/m.    General Appearance:    Alert, cooperative, no distress, appears stated age  Head:    Normocephalic, without obvious abnormality, atraumatic  Eyes:    PERRL, conjunctiva/corneas clear, EOM's intact, both eyes  Ears:    Normal external ear canals, both ears  Nose:   Nares normal, septum midline, mucosa normal, no drainage or sinus tenderness  Throat:   Lips, mucosa, and tongue normal; teeth and gums normal  Neck:   Supple, symmetrical, trachea midline, no adenopathy; thyroid: no enlargement/tenderness/nodules; no carotid bruit or JVD  Back:     Symmetric, no curvature, ROM normal, no CVA tenderness  Lungs:     Clear to auscultation bilaterally, respirations unlabored  Chest Wall:    No tenderness or deformity   Heart:    Regular rate and rhythm, S1 and S2 normal, no murmur, rub or gallop  Breast Exam:    No tenderness, masses, or nipple abnormality  Abdomen:     Soft, non-tender, bowel sounds active all four quadrants, no masses, no organomegaly.    Genitalia:    Pelvic:external genitalia normal, vagina without lesions, discharge, or tenderness Cervix normal in appearance, no cervical motion tenderness, no adnexal masses or tenderness.  Uterus enlarged, c/w fibroids, nontender.     Extremities:   Extremities normal, atraumatic, no cyanosis or edema  Pulses:   2+ and symmetric  all extremities  Skin:   Acanthosis nigricans on inner  thighs  Lymph nodes:   Cervical, supraclavicular, and axillary nodes normal  Neurologic:   CNII-XII intact, normal strength, sensation and reflexes throughout   .  Labs:  Lab Results  Component Value Date   WBC 7.7 11/20/2019   HGB 12.7 11/20/2019   HCT 40.2 11/20/2019   MCV 80 11/20/2019   PLT 310 11/20/2019    Lab Results  Component Value Date   CREATININE 0.80 01/08/2020   BUN 13 01/08/2020   NA 140 01/08/2020   K 4.3 01/08/2020   CL 105 01/08/2020   CO2 26 01/08/2020    Lab Results  Component Value Date   ALT 18 11/20/2019   AST 18 11/20/2019   ALKPHOS 72 11/20/2019   BILITOT 0.3 11/20/2019    Lab Results  Component Value Date   TSH 2.060 08/09/2019     Assessment:   1. Well woman exam with routine gynecological exam   2. Uterine enlargement/change in periods- probably fibroids   Plan:  Check pap with HPV cotesting Schedule gyn ultrasound Await blood work next week   Allie Bossier, MD  OB/GYN

## 2022-12-22 LAB — CYTOLOGY - PAP
Adequacy: ABSENT
Comment: NEGATIVE
Diagnosis: NEGATIVE
High risk HPV: NEGATIVE

## 2022-12-23 ENCOUNTER — Other Ambulatory Visit: Payer: Self-pay

## 2022-12-23 MED ORDER — CARVEDILOL 25 MG PO TABS: 25.0000 mg | ORAL_TABLET | Freq: Two times a day (BID) | ORAL | 0 refills | Status: DC

## 2022-12-23 NOTE — Telephone Encounter (Signed)
Requested Prescriptions   Signed Prescriptions Disp Refills   carvedilol (COREG) 25 MG tablet 180 tablet 0    Sig: Take 1 tablet (25 mg total) by mouth 2 (two) times daily. PLEASE CALL OFFICE TO SCHEDULE APPOINTMENT PRIOR TO NEXT REFILL    Authorizing Provider: Debbe Odea    Ordering User: Guerry Minors

## 2023-01-11 ENCOUNTER — Other Ambulatory Visit: Payer: Self-pay

## 2023-01-11 DIAGNOSIS — I10 Essential (primary) hypertension: Secondary | ICD-10-CM

## 2023-01-11 MED ORDER — LOSARTAN POTASSIUM 100 MG PO TABS: 100.0000 mg | ORAL_TABLET | Freq: Every day | ORAL | 1 refills | Status: DC

## 2023-01-11 MED ORDER — HYDROCHLOROTHIAZIDE 25 MG PO TABS
ORAL_TABLET | ORAL | 1 refills | Status: DC
Start: 2023-01-11 — End: 2023-02-07

## 2023-01-11 MED ORDER — AMLODIPINE BESYLATE 5 MG PO TABS: 5.0000 mg | ORAL_TABLET | Freq: Every day | ORAL | 1 refills | Status: DC

## 2023-01-18 ENCOUNTER — Other Ambulatory Visit: Payer: BC Managed Care – PPO

## 2023-01-18 ENCOUNTER — Ambulatory Visit: Payer: BC Managed Care – PPO | Admitting: Family

## 2023-01-23 ENCOUNTER — Ambulatory Visit: Payer: BC Managed Care – PPO | Admitting: Obstetrics & Gynecology

## 2023-02-01 ENCOUNTER — Other Ambulatory Visit: Payer: Self-pay

## 2023-02-01 MED ORDER — OZEMPIC (2 MG/DOSE) 8 MG/3ML ~~LOC~~ SOPN: 2.0000 mg | PEN_INJECTOR | SUBCUTANEOUS | 2 refills | Status: DC

## 2023-02-06 ENCOUNTER — Ambulatory Visit: Payer: BC Managed Care – PPO | Admitting: Obstetrics & Gynecology

## 2023-02-06 ENCOUNTER — Other Ambulatory Visit: Payer: BC Managed Care – PPO

## 2023-02-06 ENCOUNTER — Telehealth: Payer: Self-pay | Admitting: Obstetrics & Gynecology

## 2023-02-06 NOTE — Telephone Encounter (Signed)
Reached out to pt to give number for Centralized Scheduling for pelvic complete US that was scheduled on 02/06/2023 at 10:15 (per Dr. Marice Potter).  Spoke with pt and gave number for Centralized Scheduling.  Advised pt to call back when [place] is scheduled so that we can schedule the f/u appt with Dr. Marice Potter.

## 2023-02-07 ENCOUNTER — Other Ambulatory Visit: Payer: Self-pay

## 2023-02-07 DIAGNOSIS — I10 Essential (primary) hypertension: Secondary | ICD-10-CM

## 2023-02-07 MED ORDER — AMLODIPINE BESYLATE 5 MG PO TABS: 5.0000 mg | ORAL_TABLET | Freq: Every day | ORAL | 0 refills | Status: DC

## 2023-02-07 MED ORDER — HYDROCHLOROTHIAZIDE 25 MG PO TABS: ORAL_TABLET | ORAL | 0 refills | Status: DC

## 2023-02-07 NOTE — Telephone Encounter (Addendum)
Requested Prescriptions   Signed Prescriptions Disp Refills   hydrochlorothiazide (HYDRODIURIL) 25 MG tablet 90 tablet 0    Sig: Take 1 tablet (25 mg) by mouth once daily    Authorizing Provider: Debbe Odea    Ordering User: Katrinka Blazing, Trinita Devlin L   amLODipine (NORVASC) 5 MG tablet 90 tablet 0    Sig: Take 1 tablet (5 mg total) by mouth daily.    Authorizing Provider: Debbe Odea    Ordering User: Guerry Minors

## 2023-02-07 NOTE — Addendum Note (Signed)
Addended by: Guerry Minors on: 02/07/2023 10:35 AM   Modules accepted: Orders

## 2023-02-08 ENCOUNTER — Encounter: Payer: Self-pay | Admitting: Obstetrics & Gynecology

## 2023-02-27 ENCOUNTER — Ambulatory Visit: Payer: BC Managed Care – PPO | Admitting: Dietician

## 2023-02-27 ENCOUNTER — Ambulatory Visit: Payer: BC Managed Care – PPO | Admitting: Cardiology

## 2023-03-06 ENCOUNTER — Ambulatory Visit: Payer: BC Managed Care – PPO | Admitting: Dietician

## 2023-03-17 ENCOUNTER — Telehealth: Payer: Self-pay

## 2023-03-17 MED ORDER — CARVEDILOL 25 MG PO TABS: 25.0000 mg | ORAL_TABLET | Freq: Two times a day (BID) | ORAL | 0 refills | Status: DC

## 2023-03-17 NOTE — Telephone Encounter (Signed)
Last office visit 06/08/22 with plan to f/u in 6 months  next office visit:  04/20/23  Requested Prescriptions   Signed Prescriptions Disp Refills   carvedilol (COREG) 25 MG tablet 180 tablet 0    Sig: Take 1 tablet (25 mg total) by mouth 2 (two) times daily.    Authorizing Provider: Debbe Odea    Ordering User: Guerry Minors

## 2023-03-23 ENCOUNTER — Other Ambulatory Visit: Payer: Self-pay

## 2023-03-23 MED ORDER — LOSARTAN POTASSIUM 100 MG PO TABS: 100.0000 mg | ORAL_TABLET | Freq: Every day | ORAL | 0 refills | Status: DC

## 2023-03-27 ENCOUNTER — Encounter: Payer: Self-pay | Admitting: Dietician

## 2023-04-20 ENCOUNTER — Ambulatory Visit: Payer: BC Managed Care – PPO | Admitting: Cardiology

## 2023-05-03 NOTE — Progress Notes (Deleted)
 Cardiology Clinic Note   Date: 05/03/2023 ID: Erica Ramos, DOB 06-19-1975, MRN [id number]  Primary Cardiologist:  Redell Cave, MD  Patient Profile    Erica Ramos is a 48 y.o. female who presents to the clinic today for ***    Past medical history significant for: Palpitations/PVCs. 48-hour Holter 03/03/2015: HR 46 to 110 bpm, average 77 bpm.  Frequent PVCs (10.3%):18,952 (1084 isolated, 533 bigemeny, 178 couplets, one triplet). Echo 03/03/2015: EF 55 to 60%.  Mild LVH.  No RWMA.  Normal RV size/function.  Mild LAE. Hypertension. Prediabetes.  In summary, patient was initially seen by Dr. Raford on 02/19/2015 for abnormal EKG at the request of Dr. Antonio.  Patient was seen by PCP and noted to have ectopy on exam.  EKG showed sinus Erica, 95 bpm and right atrial enlargement.  She wore 48-hour Holter that showed frequent PVCs as above.  Echo showed normal LV/RV function, mild LVH, mild LAE.  Upon follow-up in January 2017 patient noted significant improvement on metoprolol .  Plan was to repeat Holter monitor to reassess PVC burden.  Patient was not seen again until 01/28/2020 when she was evaluated by Dr. Cave for hypertension at the request of Suzen Barefoot, NP.  BP at the time of her visit was elevated to 162/94.  She was continued on losartan , HCTZ, and Toprol  was changed to carvedilol .  Upon follow-up BP had not improved and amlodipine  was added.  She did experience some lower extremity edema and Lasix  as needed was added.     History of Present Illness    Erica Ramos is followed by Dr. Cave for the above outlined history.  Patient was last seen in the office by Dr. Cave on 06/08/2022 for routine follow-up.  She was doing well at that time with well-controlled BP.  No medication changes were made.  Today, patient ***  Palpitations/PVCs 48-hour Holter November 2016 showed frequent PVCs (10.3% burden).  Echo showed normal LV/RV function.   Patient*** -Continue carvedilol .  Hypertension BP today*** -Continue amlodipine , carvedilol , hydrochlorothiazide , losartan .   ROS: All other systems reviewed and are otherwise negative except as noted in History of Present Illness.  EKGs/Labs Reviewed        No results found for requested labs within last 365 days.   No results found for requested labs within last 365 days.   No results found for requested labs within last 365 days.   No results found for requested labs within last 365 days.  ***  Risk Assessment/Calculations    {Does this patient have ATRIAL FIBRILLATION?:[phone]} No BP recorded.  {Refresh Note OR Click here to enter BP  :1}***        Physical Exam    VS:  There were no vitals taken for this visit. , BMI There is no height or weight on file to calculate BMI.  GEN: Well nourished, well developed, in no acute distress. Neck: No JVD or carotid bruits. Cardiac: *** RRR. No murmurs. No rubs or gallops.   Respiratory:  Respirations regular and unlabored. Clear to auscultation without rales, wheezing or rhonchi. GI: Soft, nontender, nondistended. Extremities: Radials/DP/PT 2+ and equal bilaterally. No clubbing or cyanosis. No edema ***  Skin: Warm and dry, no rash. Neuro: Strength intact.  Assessment & Plan   ***  Disposition: ***     {Are you ordering a CV Procedure (e.g. stress test, cath, DCCV, TEE, etc)?   Press F2        :[id number]}  Signed, Barnie HERO. Najeh Credit, DNP, NP-C

## 2023-05-05 ENCOUNTER — Ambulatory Visit: Payer: Self-pay | Admitting: Student

## 2023-05-16 ENCOUNTER — Telehealth: Payer: Self-pay | Admitting: Cardiology

## 2023-05-16 DIAGNOSIS — I10 Essential (primary) hypertension: Secondary | ICD-10-CM

## 2023-05-16 MED ORDER — HYDROCHLOROTHIAZIDE 25 MG PO TABS: ORAL_TABLET | ORAL | 0 refills | Status: DC

## 2023-05-16 NOTE — Telephone Encounter (Signed)
 Requested Prescriptions   Signed Prescriptions Disp Refills   hydrochlorothiazide  (HYDRODIURIL ) 25 MG tablet 30 tablet 0    Sig: Take 1 tablet (25 mg) by mouth once daily    Authorizing Provider: DARLISS ROGUE    Ordering User: CLAUDENE MOATS L   last visit: 06/08/22 with plan to Follow-up in 6 months Next visit: 05/23/23

## 2023-05-16 NOTE — Telephone Encounter (Signed)
*  STAT* If patient is at the pharmacy, call can be transferred to refill team.   1. Which medications need to be refilled? (please list name of each medication and dose if known) hydrochlorothiazide  (HYDRODIURIL ) 25 MG tablet  2. Which pharmacy/location (including street and city if local pharmacy) is medication to be sent to? [id number] [address], [place] [phone]    3. Do they need a 30 day or 90 day supply?   30 day supply

## 2023-05-20 NOTE — Progress Notes (Deleted)
Cardiology Clinic Note   Date: 05/20/2023 ID: LUNA WANKO, DOB May 30, 1975, MRN [id number]  Primary Cardiologist:  Debbe Odea, MD  Patient Profile    Erica Ramos is a 48 y.o. female who presents to the clinic today for ***    Past medical history significant for: Palpitations/PVCs. 48-hour Holter 03/03/2015: HR 46 to 110 bpm, average 77 bpm.  Frequent PVCs (10.3%):18,952 (1084 isolated, 533 bigemeny, 178 couplets, one triplet). Echo 03/03/2015: EF 55 to 60%.  Mild LVH.  No RWMA.  Normal RV size/function.  Mild LAE. Hypertension. Prediabetes.  In summary, patient was initially seen by Dr. Duke Salvia on 02/19/2015 for abnormal EKG at the request of Dr. Laury Axon.  Patient was seen by PCP and noted to have ectopy on exam.  EKG showed sinus rhythm, 95 bpm and right atrial enlargement.  She wore 48-hour Holter that showed frequent PVCs as above.  Echo showed normal LV/RV function, mild LVH, mild LAE.  Upon follow-up in January 2017 patient noted significant improvement on metoprolol.  Plan was to repeat Holter monitor to reassess PVC burden.  Patient was not seen again until 01/28/2020 when she was evaluated by Dr. Azucena Cecil for hypertension at the request of Amedeo Kinsman, NP.  BP at the time of her visit was elevated to 162/94.  She was continued on losartan, HCTZ, and Toprol was changed to carvedilol.  Upon follow-up BP had not improved and amlodipine was added.  She did experience some lower extremity edema and Lasix as needed was added.     History of Present Illness    Erica Ramos is followed by Dr. Azucena Cecil for the above outlined history.  Patient was last seen in the office by Dr. Azucena Cecil on 06/08/2022 for routine follow-up.  She was doing well at that time with well-controlled BP.  No medication changes were made.  Today, patient ***  Palpitations/PVCs 48-hour Holter November 2016 showed frequent PVCs (10.3% burden).  Echo showed normal LV/RV function.   Patient*** -Continue carvedilol.  Hypertension BP today*** -Continue amlodipine, carvedilol, hydrochlorothiazide, losartan.   ROS: All other systems reviewed and are otherwise negative except as noted in History of Present Illness.  EKGs/Labs Reviewed        No results found for requested labs within last 365 days.   No results found for requested labs within last 365 days.   No results found for requested labs within last 365 days.   No results found for requested labs within last 365 days.  ***  Risk Assessment/Calculations    {Does this patient have ATRIAL FIBRILLATION?:[phone]} No BP recorded.  {Refresh Note OR Click here to enter BP  :1}***        Physical Exam    VS:  There were no vitals taken for this visit. , BMI There is no height or weight on file to calculate BMI.  GEN: Well nourished, well developed, in no acute distress. Neck: No JVD or carotid bruits. Cardiac: *** RRR. No murmurs. No rubs or gallops.   Respiratory:  Respirations regular and unlabored. Clear to auscultation without rales, wheezing or rhonchi. GI: Soft, nontender, nondistended. Extremities: Radials/DP/PT 2+ and equal bilaterally. No clubbing or cyanosis. No edema ***  Skin: Warm and dry, no rash. Neuro: Strength intact.  Assessment & Plan   ***  Disposition: ***     {Are you ordering a CV Procedure (e.g. stress test, cath, DCCV, TEE, etc)?   Press F2        :[id number]}  Signed, Etta Grandchild. Aaditya Letizia, DNP, NP-C

## 2023-05-23 ENCOUNTER — Ambulatory Visit: Payer: Self-pay | Admitting: Student

## 2023-05-23 ENCOUNTER — Other Ambulatory Visit: Payer: Self-pay | Admitting: Family

## 2023-05-28 NOTE — Progress Notes (Deleted)
 Cardiology Clinic Note   Date: 05/28/2023 ID: JENNIE HANNAY, DOB [birth year], MRN [id number]  Primary Cardiologist:  Debbe Odea, MD  Patient Profile    Erica Ramos is a 48 y.o. female who presents to the clinic today for ***    Past medical history significant for: Palpitations/PVCs. 48-hour Holter 03/03/2015: HR 46 to 110 bpm, average 77 bpm.  Frequent PVCs (10.3%):18,952 (1084 isolated, 533 bigemeny, 178 couplets, one triplet). Echo 03/03/2015: EF 55 to 60%.  Mild LVH.  No RWMA.  Normal RV size/function.  Mild LAE. Hypertension. Prediabetes.  In summary, patient was initially seen by Dr. Duke Salvia on 02/19/2015 for abnormal EKG at the request of Dr. Laury Axon.  Patient was seen by PCP and noted to have ectopy on exam.  EKG showed sinus rhythm, 95 bpm and right atrial enlargement.  She wore 48-hour Holter that showed frequent PVCs as above.  Echo showed normal LV/RV function, mild LVH, mild LAE.  Upon follow-up in January 2017 patient noted significant improvement on metoprolol.  Plan was to repeat Holter monitor to reassess PVC burden.  Patient was not seen again until 01/28/2020 when she was evaluated by Dr. Azucena Cecil for hypertension at the request of Amedeo Kinsman, NP.  BP at the time of her visit was elevated to 162/94.  She was continued on losartan, HCTZ, and Toprol was changed to carvedilol.  Upon follow-up BP had not improved and amlodipine was added.  She did experience some lower extremity edema and Lasix as needed was added.     History of Present Illness    Erica Ramos is followed by Dr. Azucena Cecil for the above outlined history.  Patient was last seen in the office by Dr. Azucena Cecil on 06/08/2022 for routine follow-up.  She was doing well at that time with well-controlled BP.  No medication changes were made.  Today, patient ***  Palpitations/PVCs 48-hour Holter November 2016 showed frequent PVCs (10.3% burden).  Echo showed normal LV/RV function.   Patient*** -Continue carvedilol.  Hypertension BP today*** -Continue amlodipine, carvedilol, hydrochlorothiazide, losartan.   ROS: All other systems reviewed and are otherwise negative except as noted in History of Present Illness.  EKGs/Labs Reviewed        No results found for requested labs within last 365 days.   No results found for requested labs within last 365 days.   No results found for requested labs within last 365 days.   No results found for requested labs within last 365 days.  ***  Risk Assessment/Calculations    {Does this patient have ATRIAL FIBRILLATION?:[phone]} No BP recorded.  {Refresh Note OR Click here to enter BP  :1}***        Physical Exam    VS:  There were no vitals taken for this visit. , BMI There is no height or weight on file to calculate BMI.  GEN: Well nourished, well developed, in no acute distress. Neck: No JVD or carotid bruits. Cardiac: *** RRR. No murmurs. No rubs or gallops.   Respiratory:  Respirations regular and unlabored. Clear to auscultation without rales, wheezing or rhonchi. GI: Soft, nontender, nondistended. Extremities: Radials/DP/PT 2+ and equal bilaterally. No clubbing or cyanosis. No edema ***  Skin: Warm and dry, no rash. Neuro: Strength intact.  Assessment & Plan   ***  Disposition: ***     {Are you ordering a CV Procedure (e.g. stress test, cath, DCCV, TEE, etc)?   Press F2        :[id number]}  Signed, Etta Grandchild. Sharifah Champine, DNP, NP-C

## 2023-06-02 ENCOUNTER — Ambulatory Visit: Payer: Self-pay | Admitting: Student

## 2023-06-05 NOTE — Progress Notes (Signed)
 Cardiology Clinic Note   Date: 06/09/2023 ID: Erica Ramos, DOB October 11, 1975, MRN [id number]  Primary Cardiologist:  Redell Cave, MD  Patient Profile    Erica Ramos is a 48 y.o. female who presents to the clinic today for yearly routine follow up.     Past medical history significant for: Palpitations/PVCs. 48-hour Holter 03/03/2015: HR 46 to 110 bpm, average 77 bpm.  Frequent PVCs (10.3%):18,952 (1084 isolated, 533 bigemeny, 178 couplets, one triplet). Echo 03/03/2015: EF 55 to 60%.  Mild LVH.  No RWMA.  Normal RV size/function.  Mild LAE. Hypertension. Prediabetes.  In summary, patient was initially seen by Dr. Raford on 02/19/2015 for abnormal EKG at the request of Dr. Antonio.  Patient was seen by PCP and noted to have ectopy on exam.  EKG showed sinus rhythm, 95 bpm and right atrial enlargement.  She wore 48-hour Holter that showed frequent PVCs as above.  Echo showed normal LV/RV function, mild LVH, mild LAE.  Upon follow-up in January 2017 patient noted significant improvement on metoprolol .  Plan was to repeat Holter monitor to reassess PVC burden.  Patient was not seen again until 01/28/2020 when she was evaluated by Dr. Cave for hypertension at the request of Suzen Barefoot, NP.  BP at the time of her visit was elevated to 162/94.  She was continued on losartan , HCTZ, and Toprol  was changed to carvedilol .  Upon follow-up BP had not improved and amlodipine  was added.  She did experience some lower extremity edema and Lasix  as needed was added.     History of Present Illness    Erica Ramos is followed by Dr. Cave for the above outlined history.  Patient was last seen in the office by Dr. Cave on 06/08/2022 for routine follow-up.  She was doing well at that time with well-controlled BP.  No medication changes were made.  Today, patient is doing well. Patient denies shortness of breath, dyspnea on exertion, lower extremity edema, orthopnea or PND. No  chest pain, pressure, or tightness. No palpitations.  She has been making big lifestyle improvements over the last several months. She has changed her diet and is starting to eat more protein and bring health snacks to work with her. She is also walking at least 30 minutes six days a week.      ROS: All other systems reviewed and are otherwise negative except as noted in History of Present Illness.  EKGs/Labs Reviewed    EKG Interpretation Date/Time:  Friday June 09 2023 08:21:03 EST Ventricular Rate:  63 PR Interval:  208 QRS Duration:  94 QT Interval:  406 QTC Calculation: 415 R Axis:   38  Text Interpretation: Normal sinus rhythm with sinus arrhythmia Normal ECG When compared with ECG of 06/08/2022 (not in muse) No significant change was found Confirmed by Loistine Sober [phone]) on 06/09/2023 8:23:59 AM   Physical Exam    VS:  BP 122/80 (BP Location: Left Arm, Patient Position: Sitting, Cuff Size: Large)   Pulse 63   Ht 5' 3 (1.6 m)   Wt 279 lb 8 oz (126.8 kg)   SpO2 98%   BMI 49.51 kg/m  , BMI Body mass index is 49.51 kg/m.  GEN: Well nourished, well developed, in no acute distress. Neck: No JVD or carotid bruits. Cardiac:  RRR. No murmurs. No rubs or gallops.   Respiratory:  Respirations regular and unlabored. Clear to auscultation without rales, wheezing or rhonchi. GI: Soft, nontender, nondistended. Extremities: Radials/DP/PT 2+ and  equal bilaterally. No clubbing or cyanosis. No edema.  Skin: Warm and dry, no rash. Neuro: Strength intact.  Assessment & Plan   Palpitations/PVCs 48-hour Holter November 2016 showed frequent PVCs (10.3% burden).  Echo showed normal LV/RV function.  Patient denies recent palpitations. EKG shows NSR with sinus arrhythmia 63 bpm.  -Continue carvedilol .  Hypertension BP today 122/80. She is making a lot of positive lifestyle changes. Her goal is to get into a place to decrease/stop taking so many medications. Discussed keeping a  regular BP log to get a better idea what her BP is doing between visits.  -Continue amlodipine , carvedilol , hydrochlorothiazide , losartan .  Disposition: Return in 1 year or sooner as needed.          Signed, Barnie HERO. Clytie Shetley, DNP, NP-C

## 2023-06-09 ENCOUNTER — Ambulatory Visit: Payer: 59 | Attending: Student | Admitting: Student

## 2023-06-09 ENCOUNTER — Encounter: Payer: Self-pay | Admitting: Student

## 2023-06-09 VITALS — BP 122/80 | HR 63 | Ht 63.0 in | Wt 279.5 lb

## 2023-06-09 DIAGNOSIS — R002 Palpitations: Secondary | ICD-10-CM

## 2023-06-09 DIAGNOSIS — I493 Ventricular premature depolarization: Secondary | ICD-10-CM | POA: Diagnosis not present

## 2023-06-09 DIAGNOSIS — I10 Essential (primary) hypertension: Secondary | ICD-10-CM

## 2023-06-09 MED ORDER — LOSARTAN POTASSIUM 100 MG PO TABS: 100.0000 mg | ORAL_TABLET | Freq: Every day | ORAL | 11 refills | Status: AC

## 2023-06-09 MED ORDER — AMLODIPINE BESYLATE 5 MG PO TABS: 5.0000 mg | ORAL_TABLET | Freq: Every day | ORAL | 3 refills | Status: AC

## 2023-06-09 MED ORDER — CARVEDILOL 25 MG PO TABS: 25.0000 mg | ORAL_TABLET | Freq: Two times a day (BID) | ORAL | 3 refills | Status: AC

## 2023-06-09 MED ORDER — HYDROCHLOROTHIAZIDE 25 MG PO TABS: ORAL_TABLET | ORAL | 11 refills | Status: DC

## 2023-06-09 NOTE — Patient Instructions (Addendum)
 Medication Instructions:  Your Physician recommend you continue on your current medication as directed.    *If you need a refill on your cardiac medications before your next appointment, please call your pharmacy*   Lab Work: None ordered at this time   BP cuff information provided.   Follow-Up: At [facility], you and your health needs are our priority.  As part of our continuing mission to provide you with exceptional heart care, we have created designated Provider Care Teams.  These Care Teams include your primary Cardiologist (physician) and Advanced Practice Providers (APPs -  Physician Assistants and Nurse Practitioners) who all work together to provide you with the care you need, when you need it.  We recommend signing up for the patient portal called MyChart.  Sign up information is provided on this After Visit Summary.  MyChart is used to connect with patients for Virtual Visits (Telemedicine).  Patients are able to view lab/test results, encounter notes, upcoming appointments, etc.  Non-urgent messages can be sent to your provider as well.   To learn more about what you can do with MyChart, go to [email or web address].    Your next appointment:   12 month(s)  Provider:   You may see Redell Cave, MD or one of the following Advanced Practice Providers on your designated Care Team:   Lonni Meager, NP Bernardino Bring, PA-C Cadence Franchester, PA-C Tylene Lunch, NP Barnie Hila, NP

## 2023-08-14 ENCOUNTER — Ambulatory Visit: Payer: Self-pay | Admitting: Family

## 2023-08-16 ENCOUNTER — Ambulatory Visit: Payer: Self-pay | Admitting: Family

## 2023-08-22 ENCOUNTER — Other Ambulatory Visit: Payer: Self-pay

## 2023-08-22 DIAGNOSIS — I10 Essential (primary) hypertension: Secondary | ICD-10-CM

## 2023-08-22 DIAGNOSIS — E782 Mixed hyperlipidemia: Secondary | ICD-10-CM

## 2023-08-22 DIAGNOSIS — R7303 Prediabetes: Secondary | ICD-10-CM

## 2023-08-23 ENCOUNTER — Ambulatory Visit: Payer: Self-pay | Admitting: Family

## 2023-08-25 ENCOUNTER — Ambulatory Visit: Payer: Self-pay | Admitting: Family

## 2023-08-31 ENCOUNTER — Ambulatory Visit: Payer: Self-pay | Admitting: Family

## 2023-09-06 ENCOUNTER — Encounter: Payer: Self-pay | Admitting: Family

## 2023-09-06 ENCOUNTER — Ambulatory Visit (INDEPENDENT_AMBULATORY_CARE_PROVIDER_SITE_OTHER): Payer: Self-pay | Admitting: Family

## 2023-09-06 VITALS — BP 118/78 | HR 88 | Ht 63.0 in | Wt 284.8 lb

## 2023-09-06 DIAGNOSIS — E559 Vitamin D deficiency, unspecified: Secondary | ICD-10-CM

## 2023-09-06 DIAGNOSIS — Z1211 Encounter for screening for malignant neoplasm of colon: Secondary | ICD-10-CM | POA: Diagnosis not present

## 2023-09-06 DIAGNOSIS — I10 Essential (primary) hypertension: Secondary | ICD-10-CM

## 2023-09-06 DIAGNOSIS — Z1231 Encounter for screening mammogram for malignant neoplasm of breast: Secondary | ICD-10-CM | POA: Diagnosis not present

## 2023-09-06 DIAGNOSIS — E1165 Type 2 diabetes mellitus with hyperglycemia: Secondary | ICD-10-CM | POA: Diagnosis not present

## 2023-09-06 DIAGNOSIS — R41840 Attention and concentration deficit: Secondary | ICD-10-CM

## 2023-09-06 DIAGNOSIS — N852 Hypertrophy of uterus: Secondary | ICD-10-CM

## 2023-09-06 DIAGNOSIS — D219 Benign neoplasm of connective and other soft tissue, unspecified: Secondary | ICD-10-CM | POA: Insufficient documentation

## 2023-09-06 LAB — CMP14+EGFR
ALT: 17 IU/L (ref 0–32)
AST: 19 IU/L (ref 0–40)
Albumin: 4.2 g/dL (ref 3.9–4.9)
Alkaline Phosphatase: 82 IU/L (ref 44–121)
BUN/Creatinine Ratio: 19 (ref 9–23)
BUN: 14 mg/dL (ref 6–24)
Bilirubin Total: 0.3 mg/dL (ref 0.0–1.2)
CO2: 23 mmol/L (ref 20–29)
Calcium: 9.5 mg/dL (ref 8.7–10.2)
Chloride: 105 mmol/L (ref 96–106)
Creatinine, Ser: 0.72 mg/dL (ref 0.57–1.00)
Globulin, Total: 2.7 g/dL (ref 1.5–4.5)
Glucose: 114 mg/dL — ABNORMAL HIGH (ref 70–99)
Potassium: 5.1 mmol/L (ref 3.5–5.2)
Sodium: 141 mmol/L (ref 134–144)
Total Protein: 6.9 g/dL (ref 6.0–8.5)
eGFR: 105 mL/min/{1.73_m2} (ref >59)

## 2023-09-06 LAB — LIPID PANEL
Chol/HDL Ratio: 2.6 ratio (ref 0.0–4.4)
Cholesterol, Total: 139 mg/dL (ref 100–199)
HDL: 54 mg/dL (ref >39)
LDL Chol Calc (NIH): 72 mg/dL (ref 0–99)
Triglycerides: 60 mg/dL (ref 0–149)
VLDL Cholesterol Cal: 13 mg/dL (ref 5–40)

## 2023-09-06 LAB — HEMOGLOBIN A1C
Est. average glucose Bld gHb Est-mCnc: 120 mg/dL
Hgb A1c MFr Bld: 5.8 % — ABNORMAL HIGH (ref 4.8–5.6)

## 2023-09-06 LAB — TSH: TSH: 2.93 u[IU]/mL (ref 0.450–4.500)

## 2023-09-06 MED ORDER — LISDEXAMFETAMINE DIMESYLATE 40 MG PO CAPS
40.0000 mg | ORAL_CAPSULE | ORAL | 0 refills | Status: DC
Start: 2023-09-06 — End: 2023-12-12

## 2023-09-06 MED ORDER — MOUNJARO 5 MG/0.5ML ~~LOC~~ SOAJ: 5.0000 mg | SUBCUTANEOUS | 3 refills | Status: DC

## 2023-09-10 ENCOUNTER — Encounter: Payer: Self-pay | Admitting: Family

## 2023-09-12 ENCOUNTER — Ambulatory Visit: Payer: Self-pay

## 2023-09-13 ENCOUNTER — Ambulatory Visit
Admission: RE | Admit: 2023-09-13 | Discharge: 2023-09-13 | Disposition: A | Discharge status: Home / Self Care | Source: Ambulatory Visit | Attending: Family | Admitting: Family

## 2023-09-13 ENCOUNTER — Other Ambulatory Visit: Payer: Self-pay | Admitting: Family

## 2023-09-13 DIAGNOSIS — N852 Hypertrophy of uterus: Secondary | ICD-10-CM

## 2023-09-13 DIAGNOSIS — D219 Benign neoplasm of connective and other soft tissue, unspecified: Secondary | ICD-10-CM

## 2023-09-13 DIAGNOSIS — F4321 Adjustment disorder with depressed mood: Secondary | ICD-10-CM

## 2023-09-17 DIAGNOSIS — E1165 Type 2 diabetes mellitus with hyperglycemia: Secondary | ICD-10-CM | POA: Insufficient documentation

## 2023-09-17 NOTE — Assessment & Plan Note (Signed)
 Re-ordering patient's Ultrasound.  Will await results and call when available.

## 2023-09-17 NOTE — Assessment & Plan Note (Signed)
 Continue current meds.  Will adjust as needed based on results.  The patient is asked to make an attempt to improve diet and exercise patterns to aid in medical management of this problem. Addressed importance of increasing and maintaining water intake.

## 2023-09-17 NOTE — Assessment & Plan Note (Signed)
 Blood pressure well controlled with current medications.  Continue current therapy.  Will reassess at follow up.

## 2023-09-17 NOTE — Assessment & Plan Note (Signed)
 Checking labs today.  Will continue supplements as needed.

## 2023-09-17 NOTE — Assessment & Plan Note (Signed)
 Changing pt to Mounjaro.  Will start at 5 mg dose, as patient has already been on Ozempic  previously.   Will reassess at follow up.

## 2023-09-17 NOTE — Progress Notes (Signed)
 Established Patient Office Visit  Subjective:  Patient ID: Erica Ramos, female    DOB: [birth year]  Age: 48 y.o. MRN: [id number]  Chief Complaint  Patient presents with   Follow-up    Discuss medications    Patient is here today for her follow up.  She has been feeling fairly well since last appointment.   She does have additional concerns to discuss today.  1) She asks if we can change the Ozempic , that she isn't getting as much out of it anymore as she had hoped.  2) She also says that she needs a colonoscopy, does have a family history.  3) Also needs her mammogram.  She has also been having issues possible fibroids, and GYN had sent her an order for a pelvic ultrasound w/transvaginal, but she wasn't ever scheduled.  Labs were recently done, so we will review these in detail today. She needs refills.   I have reviewed her active problem list, medication list, allergies, notes from last encounter, lab results for her appointment today.      No other concerns at this time.   Past Medical History:  Diagnosis Date   Abscess of axilla, left 05/01/2013   Allergy     Anxiety    Boil, breast 10/22/2019   Bronchitis    chronic - has flare every December   Cervical polyp    hx of   Chest pain 05/01/2013   Delivered by cesarean section 06/05/2016   Depression, major, single episode, moderate (HCC) 04/19/2018   Environmental allergies    Genital warts    GERD (gastroesophageal reflux disease)    Per MD chart note 12/01/2010   Heart murmur    History of uterine fibroid 06/18/2020   Exophytic/Pedunculated   Hx of abnormal Pap smear 2012   Hypertension    Morbid obesity (HCC)    BMI 47.9   Persistent headaches    worst during menstrual cycle   PVC (premature ventricular contraction) 02/20/2015   Screening for blood or protein in urine 09/17/2019   STD (sexually transmitted disease)    hx genital warts/?HSV   Vaginal bleeding 07/15/2018   Vaginal Pap smear, abnormal      Past Surgical History:  Procedure Laterality Date   CESAREAN SECTION N/A 06/05/2016   Procedure: CESAREAN SECTION;  Surgeon: Vernal Gold, MD;  Location: [facility];  Service: Obstetrics;  Laterality: N/A;   CESAREAN SECTION N/A 09/20/2017   Procedure: CESAREAN SECTION;  Surgeon: Arlee Lace, MD;  Location: [facility];  Service: Obstetrics;  Laterality: N/A;  EDD 10/02/17   COLPOSCOPY  2012   no treatment to cervix--pap smears reverted to normal   WISDOM TOOTH EXTRACTION      Social History   Socioeconomic History   Marital status: Single    Spouse name: Not on file   Number of children: 0   Years of education: Not on file   Highest education level: Not on file  Occupational History    Employer: [employer]  Tobacco Use   Smoking status: Never   Smokeless tobacco: Never  Vaping Use   Vaping status: Never Used  Substance and Sexual Activity   Alcohol use: Yes    Alcohol/week: 1.0 standard drink of alcohol    Types: 1 Standard drinks or equivalent per week    Comment: Occasionally; less than 1-2 a week   Drug use: No   Sexual activity: Yes    Partners: Male    Birth control/protection: Condom  Comment: condoms sometimes  Other Topics Concern   Not on file  Social History Narrative   Exercise--  2-3 x a week for about 30 min- 60 min   Social Drivers of Corporate investment banker Strain: Low Risk  (01/24/2020)   Overall Financial Resource Strain (CARDIA)    Difficulty of Paying Living Expenses: Not hard at all  Food Insecurity: Not on file  Transportation Needs: Not on file  Physical Activity: Insufficiently Active (01/24/2020)   Exercise Vital Sign    Days of Exercise per Week: 2 days    Minutes of Exercise per Session: 30 min  Stress: Not on file  Social Connections: Not on file  Intimate Partner Violence: Not on file    Family History  Problem Relation Age of Onset   Diabetes Mother    Hypertension Mother        Under control    Diabetes Maternal Aunt    Seizures Maternal Aunt    Diabetes Maternal Grandmother    Hypertension Maternal Grandmother    Heart Problems Maternal Grandmother    Alcohol abuse Father    Diabetes Father    Hypertension Sister    Diabetes Maternal Uncle    Hypertension Other    Obesity Other    Heart attack Paternal Grandmother    Heart Problems Paternal Grandmother    COPD Neg Hx     Allergies  Allergen Reactions   Bacitracin-Neomycin-Polymyxin Swelling    Facial swelling with topical Neosporin   Neosporin [Neomycin-Bacitracin Zn-Polymyx] Anaphylaxis   Penicillins Nausea And Vomiting, Other (See Comments) and Rash    Has patient had a PCN reaction causing immediate rash, facial/tongue/throat swelling, SOB or lightheadedness with hypotension: Yes  Has patient had a PCN reaction causing severe rash involving mucus membranes or skin necrosis: Yes  Has patient had a PCN reaction that required hospitalization Yes  Has patient had a PCN reaction occurring within the last 10 years: No  If all of the above answers are "NO", then may proceed with Cephalosporin use.  fever   Strawberry Flavoring Agent (Non-Screening)     Review of Systems  All other systems reviewed and are negative.      Objective:   BP 118/78   Pulse 88   Ht 5\' 3"  (1.6 m)   Wt 284 lb 12.8 oz (129.2 kg)   SpO2 98%   BMI 50.45 kg/m   Vitals:   09/06/23 0942  BP: 118/78  Pulse: 88  Height: 5\' 3"  (1.6 m)  Weight: 284 lb 12.8 oz (129.2 kg)  SpO2: 98%  BMI (Calculated): 50.46    Physical Exam Vitals and nursing note reviewed.  Constitutional:      Appearance: Normal appearance. She is normal weight.  HENT:     Head: Normocephalic.  Eyes:     Extraocular Movements: Extraocular movements intact.     Conjunctiva/sclera: Conjunctivae normal.     Pupils: Pupils are equal, round, and reactive to light.  Cardiovascular:     Rate and Rhythm: Normal rate.  Pulmonary:     Effort: Pulmonary effort is  normal.  Neurological:     General: No focal deficit present.     Mental Status: She is alert and oriented to person, place, and time. Mental status is at baseline.  Psychiatric:        Mood and Affect: Mood normal.        Behavior: Behavior normal.        Thought Content: Thought content  normal.        Judgment: Judgment normal.      No results found for any visits on 09/06/23.  Recent Results (from the past 2160 hours)  Hemoglobin A1c     Status: Abnormal   Collection Time: 09/05/23  7:37 AM  Result Value Ref Range   Hgb A1c MFr Bld 5.8 (H) 4.8 - 5.6 %    Comment:          Prediabetes: 5.7 - 6.4          Diabetes: >6.4          Glycemic control for adults with diabetes: <7.0    Est. average glucose Bld gHb Est-mCnc 120 mg/dL  TSH     Status: None   Collection Time: 09/05/23  7:37 AM  Result Value Ref Range   TSH 2.930 0.450 - 4.500 uIU/mL  CMP14+EGFR     Status: Abnormal   Collection Time: 09/05/23  7:37 AM  Result Value Ref Range   Glucose 114 (H) 70 - 99 mg/dL   BUN 14 6 - 24 mg/dL   Creatinine, Ser 9.60 0.57 - 1.00 mg/dL   eGFR 454 >09 WJ/XBJ/4.78   BUN/Creatinine Ratio 19 9 - 23   Sodium 141 134 - 144 mmol/L   Potassium 5.1 3.5 - 5.2 mmol/L   Chloride 105 96 - 106 mmol/L   CO2 23 20 - 29 mmol/L   Calcium  9.5 8.7 - 10.2 mg/dL   Total Protein 6.9 6.0 - 8.5 g/dL   Albumin 4.2 3.9 - 4.9 g/dL   Globulin, Total 2.7 1.5 - 4.5 g/dL   Bilirubin Total 0.3 0.0 - 1.2 mg/dL   Alkaline Phosphatase 82 44 - 121 IU/L   AST 19 0 - 40 IU/L   ALT 17 0 - 32 IU/L  Lipid panel     Status: None   Collection Time: 09/05/23  7:37 AM  Result Value Ref Range   Cholesterol, Total 139 100 - 199 mg/dL   Triglycerides 60 0 - 149 mg/dL   HDL 54 >29 mg/dL   VLDL Cholesterol Cal 13 5 - 40 mg/dL   LDL Chol Calc (NIH) 72 0 - 99 mg/dL   Chol/HDL Ratio 2.6 0.0 - 4.4 ratio    Comment:                                   T. Chol/HDL Ratio                                             Men  Women                                1/2 Avg.Risk  3.4    3.3                                   Avg.Risk  5.0    4.4                                2X Avg.Risk  9.6    7.1  3X Avg.Risk 23.4   11.0        Assessment & Plan:   Problem List Items Addressed This Visit       Cardiovascular and Mediastinum   Essential hypertension   Blood pressure well controlled with current medications.  Continue current therapy.  Will reassess at follow up.         Endocrine   Type 2 diabetes mellitus with hyperglycemia, without long-term current use of insulin (HCC) - Primary   Changing pt to Mounjaro.  Will start at 5 mg dose, as patient has already been on Ozempic  previously.   Will reassess at follow up.      Relevant Medications   tirzepatide (MOUNJARO) 5 MG/0.5ML Pen     Other   Morbid obesity (HCC)   Continue current meds.  Will adjust as needed based on results.  The patient is asked to make an attempt to improve diet and exercise patterns to aid in medical management of this problem. Addressed importance of increasing and maintaining water  intake.        Relevant Medications   tirzepatide (MOUNJARO) 5 MG/0.5ML Pen   lisdexamfetamine (VYVANSE ) 40 MG capsule   Vitamin D deficiency   Checking labs today.  Will continue supplements as needed.        Fibroids   Re-ordering patient's Ultrasound.  Will await results and call when available.        Relevant Orders   US  Pelvic Complete With Transvaginal (Completed)   Uterine enlargement   Re-ordering patient's Ultrasound.  Will await results and call when available.        Relevant Orders   US  Pelvic Complete With Transvaginal (Completed)   Other Visit Diagnoses       Screening mammogram for breast cancer       Sending order for mammogram.  Pt aware they will call to schedule.   Relevant Orders   MM 3D SCREENING MAMMOGRAM BILATERAL BREAST     Screening for colon cancer       Referral sent to  Gi for colonoscopy.   Relevant Orders   Ambulatory referral to Gastroenterology     Attention and concentration deficit       Trialing patient on Vyvanse  for 1 month.  If this is effective, will determine next steps at follow up.       Return in about 1 month (around 10/07/2023) for F/U.   Total time spent: 20 minutes  Trenda Frisk, FNP  09/06/2023   This document may have been prepared by [facility] Voice Recognition software and as such may include unintentional dictation errors.

## 2023-09-21 ENCOUNTER — Ambulatory Visit: Payer: Self-pay

## 2023-09-21 ENCOUNTER — Other Ambulatory Visit: Payer: Self-pay

## 2023-09-21 DIAGNOSIS — D259 Leiomyoma of uterus, unspecified: Secondary | ICD-10-CM

## 2023-10-10 ENCOUNTER — Ambulatory Visit: Admitting: Family

## 2023-10-18 ENCOUNTER — Ambulatory Visit: Admitting: Family

## 2023-10-18 ENCOUNTER — Encounter: Payer: Self-pay | Admitting: Family

## 2023-10-18 VITALS — HR 71 | Ht 63.0 in | Wt 288.8 lb

## 2023-10-18 DIAGNOSIS — E1165 Type 2 diabetes mellitus with hyperglycemia: Secondary | ICD-10-CM

## 2023-10-18 LAB — POC CREATINE & ALBUMIN,URINE
Albumin/Creatinine Ratio, Urine, POC: <30
Creatinine, POC: 200 mg/dL
Microalbumin Ur, POC: 10 mg/L

## 2023-10-18 MED ORDER — MOUNJARO 7.5 MG/0.5ML ~~LOC~~ SOAJ: 7.5000 mg | SUBCUTANEOUS | 1 refills | Status: DC

## 2023-10-18 NOTE — Patient Instructions (Signed)
 [facility] at [facility]  [address], [address] [facility] [place],  [place]  [address]  Main: [phone]   [facility] -   [phone]

## 2023-10-18 NOTE — Progress Notes (Signed)
 Established Patient Office Visit  Subjective:  Patient ID: Erica Ramos, female    DOB: Apr 22, 1976  Age: 48 y.o. MRN: [id number]  Chief Complaint  Patient presents with   Follow-up    1 month follow up for ADHD medicine and mounjaro      Patient is here today for her 1 month follow up.  She has been feeling fairly well since last appointment.   She does not have additional concerns to discuss today.  Says that she has been doing well with both medication changes, no side effects or concerns at this time.  Labs are not due today.  She needs refills.   I have reviewed her active problem list, medication list, allergies, health maintenance, notes from last encounter, lab results for her appointment today.      No other concerns at this time.   Past Medical History:  Diagnosis Date   Abscess of axilla, left 05/01/2013   Allergy     Anxiety    Boil, breast 10/22/2019   Bronchitis    chronic - has flare every December   Cervical polyp    hx of   Chest pain 05/01/2013   Delivered by cesarean section 06/05/2016   Depression, major, single episode, moderate (HCC) 04/19/2018   Environmental allergies    Genital warts    GERD (gastroesophageal reflux disease)    Per MD chart note 12/01/2010   Heart murmur    History of uterine fibroid 06/18/2020   Exophytic/Pedunculated   Hx of abnormal Pap smear 2012   Hypertension    Morbid obesity (HCC)    BMI 47.9   Persistent headaches    worst during menstrual cycle   PVC (premature ventricular contraction) 02/20/2015   Screening for blood or protein in urine 09/17/2019   STD (sexually transmitted disease)    hx genital warts/?HSV   Vaginal bleeding 07/15/2018   Vaginal Pap smear, abnormal     Past Surgical History:  Procedure Laterality Date   CESAREAN SECTION N/A 06/05/2016   Procedure: CESAREAN SECTION;  Surgeon: Jerolyn Foil, MD;  Location: [facility];  Service: Obstetrics;  Laterality: N/A;   CESAREAN SECTION N/A  09/20/2017   Procedure: CESAREAN SECTION;  Surgeon: Rosalva Sawyer, MD;  Location: [facility];  Service: Obstetrics;  Laterality: N/A;  EDD 10/02/17   COLPOSCOPY  2012   no treatment to cervix--pap smears reverted to normal   WISDOM TOOTH EXTRACTION      Social History   Socioeconomic History   Marital status: Single    Spouse name: Not on file   Number of children: 0   Years of education: Not on file   Highest education level: Not on file  Occupational History    Employer: [employer]  Tobacco Use   Smoking status: Never   Smokeless tobacco: Never  Vaping Use   Vaping status: Never Used  Substance and Sexual Activity   Alcohol use: Yes    Alcohol/week: 1.0 standard drink of alcohol    Types: 1 Standard drinks or equivalent per week    Comment: Occasionally; less than 1-2 a week   Drug use: No   Sexual activity: Yes    Partners: Male    Birth control/protection: Condom    Comment: condoms sometimes  Other Topics Concern   Not on file  Social History Narrative   Exercise--  2-3 x a week for about 30 min- 60 min   Social Drivers of Corporate investment banker Strain:  Low Risk  (01/24/2020)   Overall Financial Resource Strain (CARDIA)    Difficulty of Paying Living Expenses: Not hard at all  Food Insecurity: Not on file  Transportation Needs: Not on file  Physical Activity: Insufficiently Active (01/24/2020)   Exercise Vital Sign    Days of Exercise per Week: 2 days    Minutes of Exercise per Session: 30 min  Stress: Not on file  Social Connections: Not on file  Intimate Partner Violence: Not on file    Family History  Problem Relation Age of Onset   Diabetes Mother    Hypertension Mother        Under control   Diabetes Maternal Aunt    Seizures Maternal Aunt    Diabetes Maternal Grandmother    Hypertension Maternal Grandmother    Heart Problems Maternal Grandmother    Alcohol abuse Father    Diabetes Father    Hypertension Sister    Diabetes  Maternal Uncle    Hypertension Other    Obesity Other    Heart attack Paternal Grandmother    Heart Problems Paternal Grandmother    COPD Neg Hx     Allergies  Allergen Reactions   Bacitracin-Neomycin-Polymyxin Swelling    Facial swelling with topical Neosporin   Neosporin [Neomycin-Bacitracin Zn-Polymyx] Anaphylaxis   Penicillins Nausea And Vomiting, Other (See Comments) and Rash    Has patient had a PCN reaction causing immediate rash, facial/tongue/throat swelling, SOB or lightheadedness with hypotension: Yes  Has patient had a PCN reaction causing severe rash involving mucus membranes or skin necrosis: Yes  Has patient had a PCN reaction that required hospitalization Yes  Has patient had a PCN reaction occurring within the last 10 years: No  If all of the above answers are NO, then may proceed with Cephalosporin use.  fever   Strawberry Flavoring Agent (Non-Screening)     Review of Systems  All other systems reviewed and are negative.      Objective:   Pulse 71   Ht 5' 3 (1.6 m)   Wt 288 lb 12.8 oz (131 kg)   LMP 10/10/2023 (Approximate)   SpO2 97%   BMI 51.16 kg/m   Vitals:   10/18/23 0908  Pulse: 71  Height: 5' 3 (1.6 m)  Weight: 288 lb 12.8 oz (131 kg)  SpO2: 97%  BMI (Calculated): 51.17    Physical Exam Vitals and nursing note reviewed.  Constitutional:      Appearance: Normal appearance. She is normal weight.  HENT:     Head: Normocephalic.   Eyes:     Extraocular Movements: Extraocular movements intact.     Conjunctiva/sclera: Conjunctivae normal.     Pupils: Pupils are equal, round, and reactive to light.    Cardiovascular:     Rate and Rhythm: Normal rate.  Pulmonary:     Effort: Pulmonary effort is normal.   Neurological:     General: No focal deficit present.     Mental Status: She is alert and oriented to person, place, and time. Mental status is at baseline.   Psychiatric:        Mood and Affect: Mood normal.         Behavior: Behavior normal.        Thought Content: Thought content normal.      No results found for any visits on 10/18/23.  Recent Results (from the past 2160 hours)  Hemoglobin A1c     Status: Abnormal   Collection Time: 09/05/23  7:37  AM  Result Value Ref Range   Hgb A1c MFr Bld 5.8 (H) 4.8 - 5.6 %    Comment:          Prediabetes: 5.7 - 6.4          Diabetes: >6.4          Glycemic control for adults with diabetes: <7.0    Est. average glucose Bld gHb Est-mCnc 120 mg/dL  TSH     Status: None   Collection Time: 09/05/23  7:37 AM  Result Value Ref Range   TSH 2.930 0.450 - 4.500 uIU/mL  CMP14+EGFR     Status: Abnormal   Collection Time: 09/05/23  7:37 AM  Result Value Ref Range   Glucose 114 (H) 70 - 99 mg/dL   BUN 14 6 - 24 mg/dL   Creatinine, Ser 9.27 0.57 - 1.00 mg/dL   eGFR 894 >40 fO/fpw/8.26   BUN/Creatinine Ratio 19 9 - 23   Sodium 141 134 - 144 mmol/L   Potassium 5.1 3.5 - 5.2 mmol/L   Chloride 105 96 - 106 mmol/L   CO2 23 20 - 29 mmol/L   Calcium  9.5 8.7 - 10.2 mg/dL   Total Protein 6.9 6.0 - 8.5 g/dL   Albumin 4.2 3.9 - 4.9 g/dL   Globulin, Total 2.7 1.5 - 4.5 g/dL   Bilirubin Total 0.3 0.0 - 1.2 mg/dL   Alkaline Phosphatase 82 44 - 121 IU/L   AST 19 0 - 40 IU/L   ALT 17 0 - 32 IU/L  Lipid panel     Status: None   Collection Time: 09/05/23  7:37 AM  Result Value Ref Range   Cholesterol, Total 139 100 - 199 mg/dL   Triglycerides 60 0 - 149 mg/dL   HDL 54 >60 mg/dL   VLDL Cholesterol Cal 13 5 - 40 mg/dL   LDL Chol Calc (NIH) 72 0 - 99 mg/dL   Chol/HDL Ratio 2.6 0.0 - 4.4 ratio    Comment:                                   T. Chol/HDL Ratio                                             Men  Women                               1/2 Avg.Risk  3.4    3.3                                   Avg.Risk  5.0    4.4                                2X Avg.Risk  9.6    7.1                                3X Avg.Risk 23.4   11.0        Assessment & Plan Type 2  diabetes mellitus with hyperglycemia, without long-term  current use of insulin (HCC) Patient stable.  Well controlled with current therapy.   Continue current meds.   Morbid obesity (HCC) Continue current meds.  Will adjust as needed based on results.  The patient is asked to make an attempt to improve diet and exercise patterns to aid in medical management of this problem. Addressed importance of increasing and maintaining water  intake.      Return in about 1 month (around 11/17/2023).   Total time spent: 20 minutes  ALAN CHRISTELLA ARRANT, FNP  10/18/2023   This document may have been prepared by [facility] Voice Recognition software and as such may include unintentional dictation errors.

## 2023-10-31 ENCOUNTER — Ambulatory Visit
Admission: RE | Admit: 2023-10-31 | Discharge: 2023-10-31 | Disposition: A | Discharge status: Home / Self Care | Source: Ambulatory Visit | Attending: Family | Admitting: Family

## 2023-10-31 DIAGNOSIS — Z1231 Encounter for screening mammogram for malignant neoplasm of breast: Secondary | ICD-10-CM | POA: Insufficient documentation

## 2023-11-08 ENCOUNTER — Encounter: Payer: Self-pay | Admitting: Obstetrics and Gynecology

## 2023-11-08 ENCOUNTER — Ambulatory Visit: Admitting: Obstetrics and Gynecology

## 2023-11-08 ENCOUNTER — Encounter: Admitting: Obstetrics and Gynecology

## 2023-11-08 VITALS — BP 122/83 | HR 82 | Ht 63.0 in | Wt 289.6 lb

## 2023-11-08 DIAGNOSIS — D259 Leiomyoma of uterus, unspecified: Secondary | ICD-10-CM

## 2023-11-08 DIAGNOSIS — N920 Excessive and frequent menstruation with regular cycle: Secondary | ICD-10-CM | POA: Diagnosis not present

## 2023-11-08 DIAGNOSIS — D219 Benign neoplasm of connective and other soft tissue, unspecified: Secondary | ICD-10-CM

## 2023-11-08 NOTE — Progress Notes (Signed)
 HPI:      [name] is a 48 y.o. [id number] who LMP was Patient's last menstrual period was 10/10/2023 (approximate).  Subjective:   She presents today to discuss her uterine fibroids.  She has begun to have heavier painful periods. Of significant note, she is not sure if she has completed childbearing and would like to keep her uterus.    Hx: The following portions of the patient's history were reviewed and updated as appropriate:             She  has a past medical history of Abscess of axilla, left (05/01/2013), Allergy , Anxiety, Boil, breast (10/22/2019), Bronchitis, Cervical polyp, Chest pain (05/01/2013), Delivered by cesarean section (06/05/2016), Depression, major, single episode, moderate (HCC) (04/19/2018), Environmental allergies, Genital warts, GERD (gastroesophageal reflux disease), Heart murmur, History of uterine fibroid (06/18/2020), abnormal Pap smear (2012), Hypertension, Morbid obesity (HCC), Persistent headaches, PVC (premature ventricular contraction) (02/20/2015), Screening for blood or protein in urine (09/17/2019), STD (sexually transmitted disease), Vaginal bleeding (07/15/2018), and Vaginal Pap smear, abnormal. She does not have any pertinent problems on file. She  has a past surgical history that includes Wisdom tooth extraction; Colposcopy (2012); Cesarean section (N/A, 06/05/2016); and Cesarean section (N/A, 09/20/2017). Her family history includes Alcohol abuse in her father; Diabetes in her father, maternal aunt, maternal grandmother, maternal uncle, and mother; Heart Problems in her maternal grandmother and paternal grandmother; Heart attack in her paternal grandmother; Hypertension in her maternal grandmother, mother, sister, and another family member; Obesity in an other family member; Seizures in her maternal aunt. She  reports that she has never smoked. She has never used smokeless tobacco. She reports current alcohol use of about 1.0 standard drink of alcohol  per week. She reports that she does not use drugs. She has a current medication list which includes the following prescription(s): amlodipine , carvedilol , cetirizine , garlic, hydrochlorothiazide , lisdexamfetamine, losartan , multivitamin-prenatal, sertraline , mounjaro , triamcinolone , and triamcinolone  cream. She is allergic to bacitracin-neomycin-polymyxin, neosporin [neomycin-bacitracin zn-polymyx], penicillins, and strawberry flavoring agent (non-screening).       Review of Systems:  Review of Systems  Constitutional: Denied constitutional symptoms, night sweats, recent illness, fatigue, fever, insomnia and weight loss.  Eyes: Denied eye symptoms, eye pain, photophobia, vision change and visual disturbance.  Ears/Nose/Throat/Neck: Denied ear, nose, throat or neck symptoms, hearing loss, nasal discharge, sinus congestion and sore throat.  Cardiovascular: Denied cardiovascular symptoms, arrhythmia, chest pain/pressure, edema, exercise intolerance, orthopnea and palpitations.  Respiratory: Denied pulmonary symptoms, asthma, pleuritic pain, productive sputum, cough, dyspnea and wheezing.  Gastrointestinal: Denied, gastro-esophageal reflux, melena, nausea and vomiting.  Genitourinary: See HPI for additional information.  Musculoskeletal: Denied musculoskeletal symptoms, stiffness, swelling, muscle weakness and myalgia.  Dermatologic: Denied dermatology symptoms, rash and scar.  Neurologic: Denied neurology symptoms, dizziness, headache, neck pain and syncope.  Psychiatric: Denied psychiatric symptoms, anxiety and depression.  Endocrine: Denied endocrine symptoms including hot flashes and night sweats.   Meds:   Current Outpatient Medications on File Prior to Visit  Medication Sig Dispense Refill   amLODipine  (NORVASC ) 5 MG tablet Take 1 tablet (5 mg total) by mouth daily. 90 tablet 3   carvedilol  (COREG ) 25 MG tablet Take 1 tablet (25 mg total) by mouth 2 (two) times daily. 180 tablet 3    cetirizine  (ZYRTEC ) 10 MG tablet Take 1 tablet (10 mg total) by mouth daily. 90 tablet 3   GARLIC PO Take 1 capsule by mouth daily.     hydrochlorothiazide  (HYDRODIURIL ) 25 MG tablet Take 1 tablet (25  mg) by mouth once daily 30 tablet 11   lisdexamfetamine (VYVANSE ) 40 MG capsule Take 1 capsule (40 mg total) by mouth every morning. 30 capsule 0   losartan  (COZAAR ) 100 MG tablet Take 1 tablet (100 mg total) by mouth daily. 30 tablet 11   Prenatal Vit-Fe Fumarate-FA (MULTIVITAMIN-PRENATAL) 27-0.8 MG TABS tablet Take 1 tablet by mouth daily.      sertraline  (ZOLOFT ) 50 MG tablet TAKE 1 TABLET BY MOUTH EVERY DAY 90 tablet 1   tirzepatide  (MOUNJARO ) 7.5 MG/0.5ML Pen Inject 7.5 mg into the skin once a week. 2 mL 1   triamcinolone  (NASACORT  AQ) 55 MCG/ACT AERO nasal inhaler Place 2 sprays into the nose daily. 1 Inhaler 12   triamcinolone  cream (KENALOG ) 0.1 % Apply 1 application topically 2 (two) times daily as needed. 30 g 0   No current facility-administered medications on file prior to visit.      Objective:     Vitals:   11/08/23 0812  BP: 122/83  Pulse: 82   Filed Weights   11/08/23 0812  Weight: 289 lb 9.6 oz (131.4 kg)              Ultrasounds reviewed and discussed with patient          Assessment:    [id number] Patient Active Problem List   Diagnosis Date Noted   Type 2 diabetes mellitus with hyperglycemia, without long-term current use of insulin (HCC) 09/17/2023   Fibroids 09/06/2023   Uterine enlargement 09/06/2023   Right ovarian cyst 10/16/2020   Vitamin D deficiency 11/20/2019   Essential hypertension 06/17/2017   Right knee pain 07/24/2015   PVC (premature ventricular contraction) 02/20/2015   Anxiety and depression 05/01/2013   Seasonal allergic rhinitis 06/28/2012   Morbid obesity (HCC) 06/28/2012     1. Fibroids   2. Menorrhagia with regular cycle     Exophytic fibroid in the lower uterine segment likely causing her issues with heavy bleeding and  cramping.   Plan:            1.  We have discussed fibroids in detail.  The natural course of history of uterine fibroids as well as the effect of menopause and fibroids was discussed.  Use of hormonal methods of cycle control including Mirena IUD, uterine fibroid embolization, and possible hysterectomy discussed.  All of her questions were answered.  She seems most interested in uterine fibroid embolization.  Literature given.  She will contact us  when she has reached a decision or for further care after UFE. Orders No orders of the defined types were placed in this encounter.   No orders of the defined types were placed in this encounter.     F/U  No follow-ups on file.  Alm DOROTHA Sar, M.D. 11/08/2023 9:03 AM

## 2023-11-08 NOTE — Progress Notes (Signed)
 Patient presents today to follow-up after recent ultrasound for fibroids and menorrhagia. She states years of heavy cycles and cramping. Currently not using anything for birth control as she is unsure if she wants to become pregnant at this time, would like to discuss next steps.

## 2023-11-17 ENCOUNTER — Ambulatory Visit: Admitting: Family

## 2023-11-28 ENCOUNTER — Ambulatory Visit (INDEPENDENT_AMBULATORY_CARE_PROVIDER_SITE_OTHER): Admitting: Family

## 2023-11-28 ENCOUNTER — Encounter: Payer: Self-pay | Admitting: Family

## 2023-11-28 MED ORDER — MOUNJARO 10 MG/0.5ML ~~LOC~~ SOAJ: 10.0000 mg | SUBCUTANEOUS | 2 refills | Status: DC

## 2023-12-05 ENCOUNTER — Other Ambulatory Visit: Payer: Self-pay

## 2023-12-12 ENCOUNTER — Other Ambulatory Visit: Payer: Self-pay

## 2023-12-13 NOTE — Assessment & Plan Note (Signed)
 Patient stable.  Well controlled with current therapy.   Continue current meds.

## 2023-12-13 NOTE — Assessment & Plan Note (Signed)
 Continue current meds.  Will adjust as needed based on results.  The patient is asked to make an attempt to improve diet and exercise patterns to aid in medical management of this problem. Addressed importance of increasing and maintaining water  intake.

## 2023-12-14 MED ORDER — LISDEXAMFETAMINE DIMESYLATE 40 MG PO CAPS: 40.0000 mg | ORAL_CAPSULE | ORAL | 0 refills | Status: DC

## 2023-12-29 ENCOUNTER — Encounter: Payer: Self-pay | Admitting: Family

## 2024-01-23 ENCOUNTER — Other Ambulatory Visit: Payer: Self-pay | Admitting: Family

## 2024-01-25 MED ORDER — LISDEXAMFETAMINE DIMESYLATE 40 MG PO CAPS: 40.0000 mg | ORAL_CAPSULE | ORAL | 0 refills | Status: DC

## 2024-01-30 ENCOUNTER — Ambulatory Visit: Admitting: Family

## 2024-02-01 ENCOUNTER — Telehealth: Admitting: Physician Assistant

## 2024-02-01 ENCOUNTER — Telehealth: Payer: Self-pay | Admitting: Family

## 2024-02-01 ENCOUNTER — Encounter: Admitting: Physician Assistant

## 2024-02-01 ENCOUNTER — Ambulatory Visit: Admitting: Family

## 2024-02-01 DIAGNOSIS — J0110 Acute frontal sinusitis, unspecified: Secondary | ICD-10-CM | POA: Diagnosis not present

## 2024-02-01 MED ORDER — IPRATROPIUM BROMIDE 0.03 % NA SOLN: 2.0000 | Freq: Two times a day (BID) | NASAL | 0 refills | Status: AC

## 2024-02-01 MED ORDER — DOXYCYCLINE HYCLATE 100 MG PO TABS: 100.0000 mg | ORAL_TABLET | Freq: Two times a day (BID) | ORAL | 0 refills | Status: DC

## 2024-02-01 NOTE — Telephone Encounter (Signed)
 Patient left VM requesting a Z-pak. States she is having sinus pain and pressure especially near her nasal area and eye brows.

## 2024-02-01 NOTE — Telephone Encounter (Signed)
 This encounter was created in error - please disregard.

## 2024-02-01 NOTE — Progress Notes (Signed)
 Duplicate, has a VV scheduled.  Erroneous encounter.

## 2024-02-01 NOTE — Patient Instructions (Signed)
 Arne JONETTA Pizza, thank you for joining [name], PA-C for today's virtual visit.  While this provider is not your primary care provider (PCP), if your PCP is located in our provider database this encounter information will be shared with them immediately following your visit.   A [facility] MyChart account gives you access to today's visit and all your visits, tests, and labs performed at [facility]  click here if you don't have a [facility] MyChart account or go to [email or web address]  Consent: (Patient) Jamesa Tedrick Belcourt provided verbal consent for this virtual visit at the beginning of the encounter.  Current Medications:  Current Outpatient Medications:    amLODipine  (NORVASC ) 5 MG tablet, Take 1 tablet (5 mg total) by mouth daily., Disp: 90 tablet, Rfl: 3   carvedilol  (COREG ) 25 MG tablet, Take 1 tablet (25 mg total) by mouth 2 (two) times daily., Disp: 180 tablet, Rfl: 3   cetirizine  (ZYRTEC ) 10 MG tablet, Take 1 tablet (10 mg total) by mouth daily., Disp: 90 tablet, Rfl: 3   doxycycline  (VIBRA -TABS) 100 MG tablet, Take 1 tablet (100 mg total) by mouth 2 (two) times daily., Disp: 20 tablet, Rfl: 0   GARLIC PO, Take 1 capsule by mouth daily., Disp: , Rfl:    hydrochlorothiazide  (HYDRODIURIL ) 25 MG tablet, Take 1 tablet (25 mg) by mouth once daily, Disp: 30 tablet, Rfl: 11   ipratropium (ATROVENT) 0.03 % nasal spray, Place 2 sprays into both nostrils every 12 (twelve) hours., Disp: 30 mL, Rfl: 0   lisdexamfetamine (VYVANSE ) 40 MG capsule, Take 1 capsule (40 mg total) by mouth every morning., Disp: 30 capsule, Rfl: 0   losartan  (COZAAR ) 100 MG tablet, Take 1 tablet (100 mg total) by mouth daily., Disp: 30 tablet, Rfl: 11   Prenatal Vit-Fe Fumarate-FA (MULTIVITAMIN-PRENATAL) 27-0.8 MG TABS tablet, Take 1 tablet by mouth daily. , Disp: , Rfl:    sertraline  (ZOLOFT ) 50 MG tablet, TAKE 1 TABLET BY MOUTH EVERY DAY, Disp: 90 tablet, Rfl: 1   tirzepatide  (MOUNJARO )  10 MG/0.5ML Pen, Inject 10 mg into the skin once a week., Disp: 2 mL, Rfl: 2   triamcinolone  cream (KENALOG ) 0.1 %, Apply 1 application topically 2 (two) times daily as needed., Disp: 30 g, Rfl: 0   Medications ordered in this encounter:  Meds ordered this encounter  Medications   ipratropium (ATROVENT) 0.03 % nasal spray    Sig: Place 2 sprays into both nostrils every 12 (twelve) hours.    Dispense:  30 mL    Refill:  0    Supervising Provider:   BLAISE ALEENE KIDD [[id number]]   doxycycline  (VIBRA -TABS) 100 MG tablet    Sig: Take 1 tablet (100 mg total) by mouth 2 (two) times daily.    Dispense:  20 tablet    Refill:  0    Supervising Provider:   LAMPTEY, PHILIP O [[id number]]     *If you need refills on other medications prior to your next appointment, please contact your pharmacy*  Follow-Up: Call back or seek an in-person evaluation if the symptoms worsen or if the condition fails to improve as anticipated.  [facility] [phone]  Other Instructions Please take antibiotic as directed.  Increase fluid intake.  Use Saline nasal spray.  Take a daily multivitamin. Start the Atrovent nasal spray as directed.  Place a humidifier in the bedroom.  If you note any non-resolving, new, or worsening symptoms despite treatment, please seek an in-person evaluation ASAP.  Sinusitis Sinusitis is redness, soreness, and swelling (inflammation) of the paranasal sinuses. Paranasal sinuses are air pockets within the bones of your face (beneath the eyes, the middle of the forehead, or above the eyes). In healthy paranasal sinuses, mucus is able to drain out, and air is able to circulate through them by way of your nose. However, when your paranasal sinuses are inflamed, mucus and air can become trapped. This can allow bacteria and other germs to grow and cause infection. Sinusitis can develop quickly and last only a short time (acute) or continue over a long period (chronic). Sinusitis that  lasts for more than 12 weeks is considered chronic.  CAUSES  Causes of sinusitis include: Allergies. Structural abnormalities, such as displacement of the cartilage that separates your nostrils (deviated septum), which can decrease the air flow through your nose and sinuses and affect sinus drainage. Functional abnormalities, such as when the small hairs (cilia) that line your sinuses and help remove mucus do not work properly or are not present. SYMPTOMS  Symptoms of acute and chronic sinusitis are the same. The primary symptoms are pain and pressure around the affected sinuses. Other symptoms include: Upper toothache. Earache. Headache. Bad breath. Decreased sense of smell and taste. A cough, which worsens when you are lying flat. Fatigue. Fever. Thick drainage from your nose, which often is green and may contain pus (purulent). Swelling and warmth over the affected sinuses. DIAGNOSIS  Your caregiver will perform a physical exam. During the exam, your caregiver may: Look in your nose for signs of abnormal growths in your nostrils (nasal polyps). Tap over the affected sinus to check for signs of infection. View the inside of your sinuses (endoscopy) with a special imaging device with a light attached (endoscope), which is inserted into your sinuses. If your caregiver suspects that you have chronic sinusitis, one or more of the following tests may be recommended: Allergy  tests. Nasal culture A sample of mucus is taken from your nose and sent to a lab and screened for bacteria. Nasal cytology A sample of mucus is taken from your nose and examined by your caregiver to determine if your sinusitis is related to an allergy . TREATMENT  Most cases of acute sinusitis are related to a viral infection and will resolve on their own within 10 days. Sometimes medicines are prescribed to help relieve symptoms (pain medicine, decongestants, nasal steroid sprays, or saline sprays).  However, for  sinusitis related to a bacterial infection, your caregiver will prescribe antibiotic medicines. These are medicines that will help kill the bacteria causing the infection.  Rarely, sinusitis is caused by a fungal infection. In theses cases, your caregiver will prescribe antifungal medicine. For some cases of chronic sinusitis, surgery is needed. Generally, these are cases in which sinusitis recurs more than 3 times per year, despite other treatments. HOME CARE INSTRUCTIONS  Drink plenty of water . Water  helps thin the mucus so your sinuses can drain more easily. Use a humidifier. Inhale steam 3 to 4 times a day (for example, sit in the bathroom with the shower running). Apply a warm, moist washcloth to your face 3 to 4 times a day, or as directed by your caregiver. Use saline nasal sprays to help moisten and clean your sinuses. Take over-the-counter or prescription medicines for pain, discomfort, or fever only as directed by your caregiver. SEEK IMMEDIATE MEDICAL CARE IF: You have increasing pain or severe headaches. You have nausea, vomiting, or drowsiness. You have swelling around your face. You have vision  problems. You have a stiff neck. You have difficulty breathing. MAKE SURE YOU:  Understand these instructions. Will watch your condition. Will get help right away if you are not doing well or get worse. Document Released: 04/18/2005 Document Revised: 07/11/2011 Document Reviewed: 05/03/2011 [facility] Patient Information 2014 [place], [place].    If you have been instructed to have an in-person evaluation today at a local Urgent Care facility, please use the link below. It will take you to a list of all of our available [facility], including address, phone number and hours of operation. Please do not delay care.  [facility]  If you or a family member do not have a primary care provider, use the link below to schedule a visit and establish care. When you  choose a [facility] primary care physician or advanced practice provider, you gain a long-term partner in health. Find a Primary Care Provider  Learn more about [facility]'s in-office and virtual care options: [facility] - Get Care Now

## 2024-02-01 NOTE — Progress Notes (Signed)
 Virtual Visit Consent   Erica Ramos, you are scheduled for a virtual visit with a [facility] provider today. Just as with appointments in the office, your consent must be obtained to participate. Your consent will be active for this visit and any virtual visit you may have with one of our providers in the next 365 days. If you have a MyChart account, a copy of this consent can be sent to you electronically.  As this is a virtual visit, video technology does not allow for your provider to perform a traditional examination. This may limit your provider's ability to fully assess your condition. If your provider identifies any concerns that need to be evaluated in person or the need to arrange testing (such as labs, EKG, etc.), we will make arrangements to do so. Although advances in technology are sophisticated, we cannot ensure that it will always work on either your end or our end. If the connection with a video visit is poor, the visit may have to be switched to a telephone visit. With either a video or telephone visit, we are not always able to ensure that we have a secure connection.  By engaging in this virtual visit, you consent to the provision of healthcare and authorize for your insurance to be billed (if applicable) for the services provided during this visit. Depending on your insurance coverage, you may receive a charge related to this service.  I need to obtain your verbal consent now. Are you willing to proceed with your visit today? MILINDA SWEENEY has provided verbal consent on 02/01/2024 for a virtual visit (video or telephone). Erica Ramos, [place]  Date: 02/01/2024 11:04 AM   Virtual Visit via Video Note   I, Erica Ramos, connected with  LASHARON DUNIVAN  ([id number], 1976-01-20) on 02/01/24 at 11:00 AM EDT by a video-enabled telemedicine application and verified that I am speaking with the correct person using two identifiers.  Location: Patient: Virtual Visit  Location Patient: Home Provider: Virtual Visit Location Provider: Home Office   I discussed the limitations of evaluation and management by telemedicine and the availability of in person appointments. The patient expressed understanding and agreed to proceed.    History of Present Illness: Erica Ramos is a 48 y.o. who identifies as a female who was assigned female at birth, and is being seen today for several days of acute worsening of nasal congestion now with 2-3 days of sinus pressure with frontal and maxillary sinus pain on the R side. Denies fever, chills. Some cough but mild. URI a few weeks ago and mostly resolved but residual nasal congestion.  HPI: HPI  Problems:  Patient Active Problem List   Diagnosis Date Noted   Type 2 diabetes mellitus with hyperglycemia, without long-term current use of insulin (HCC) 09/17/2023   Fibroids 09/06/2023   Uterine enlargement 09/06/2023   Right ovarian cyst 10/16/2020   Vitamin D deficiency 11/20/2019   Essential hypertension 06/17/2017   Right knee pain 07/24/2015   PVC (premature ventricular contraction) 02/20/2015   Anxiety and depression 05/01/2013   Seasonal allergic rhinitis 06/28/2012   Morbid obesity (HCC) 06/28/2012    Allergies:  Allergies  Allergen Reactions   Bacitracin-Neomycin-Polymyxin Swelling    Facial swelling with topical Neosporin   Neosporin [Neomycin-Bacitracin Zn-Polymyx] Anaphylaxis   Penicillins Nausea And Vomiting, Other (See Comments) and Rash    Has patient had a PCN reaction causing immediate rash, facial/tongue/throat swelling, SOB or lightheadedness with hypotension: Yes  Has  patient had a PCN reaction causing severe rash involving mucus membranes or skin necrosis: Yes  Has patient had a PCN reaction that required hospitalization Yes  Has patient had a PCN reaction occurring within the last 10 years: No  If all of the above answers are NO, then may proceed with Cephalosporin use.  fever    Strawberry Flavoring Agent (Non-Screening)    Medications:  Current Outpatient Medications:    doxycycline  (VIBRA -TABS) 100 MG tablet, Take 1 tablet (100 mg total) by mouth 2 (two) times daily., Disp: 20 tablet, Rfl: 0   ipratropium (ATROVENT) 0.03 % nasal spray, Place 2 sprays into both nostrils every 12 (twelve) hours., Disp: 30 mL, Rfl: 0   amLODipine  (NORVASC ) 5 MG tablet, Take 1 tablet (5 mg total) by mouth daily., Disp: 90 tablet, Rfl: 3   carvedilol  (COREG ) 25 MG tablet, Take 1 tablet (25 mg total) by mouth 2 (two) times daily., Disp: 180 tablet, Rfl: 3   cetirizine  (ZYRTEC ) 10 MG tablet, Take 1 tablet (10 mg total) by mouth daily., Disp: 90 tablet, Rfl: 3   GARLIC PO, Take 1 capsule by mouth daily., Disp: , Rfl:    hydrochlorothiazide  (HYDRODIURIL ) 25 MG tablet, Take 1 tablet (25 mg) by mouth once daily, Disp: 30 tablet, Rfl: 11   lisdexamfetamine (VYVANSE ) 40 MG capsule, Take 1 capsule (40 mg total) by mouth every morning., Disp: 30 capsule, Rfl: 0   losartan  (COZAAR ) 100 MG tablet, Take 1 tablet (100 mg total) by mouth daily., Disp: 30 tablet, Rfl: 11   Prenatal Vit-Fe Fumarate-FA (MULTIVITAMIN-PRENATAL) 27-0.8 MG TABS tablet, Take 1 tablet by mouth daily. , Disp: , Rfl:    sertraline  (ZOLOFT ) 50 MG tablet, TAKE 1 TABLET BY MOUTH EVERY DAY, Disp: 90 tablet, Rfl: 1   tirzepatide  (MOUNJARO ) 10 MG/0.5ML Pen, Inject 10 mg into the skin once a week., Disp: 2 mL, Rfl: 2   triamcinolone  cream (KENALOG ) 0.1 %, Apply 1 application topically 2 (two) times daily as needed., Disp: 30 g, Rfl: 0  Observations/Objective: Patient is well-developed, well-nourished in no acute distress.  Resting comfortably at home.  Head is normocephalic, atraumatic.  No labored breathing. Speech is clear and coherent with logical content.  Patient is alert and oriented at baseline.   Assessment and Plan: 1. Acute non-recurrent frontal sinusitis (Primary) - ipratropium (ATROVENT) 0.03 % nasal spray; Place 2  sprays into both nostrils every 12 (twelve) hours.  Dispense: 30 mL; Refill: 0 - doxycycline  (VIBRA -TABS) 100 MG tablet; Take 1 tablet (100 mg total) by mouth 2 (two) times daily.  Dispense: 20 tablet; Refill: 0  Rx Doxycycline .  Increase fluids.  Rest.  Saline nasal spray.  Probiotic.  Mucinex as directed.  Humidifier in bedroom. Atrovent per orders.  Call or return to clinic if symptoms are not improving.   Follow Up Instructions: I discussed the assessment and treatment plan with the patient. The patient was provided an opportunity to ask questions and all were answered. The patient agreed with the plan and demonstrated an understanding of the instructions.  A copy of instructions were sent to the patient via MyChart unless otherwise noted below.   The patient was advised to call back or seek an in-person evaluation if the symptoms worsen or if the condition fails to improve as anticipated.    Erica Velma Lunger, PA-C

## 2024-02-06 ENCOUNTER — Ambulatory Visit: Admitting: Family

## 2024-02-14 ENCOUNTER — Ambulatory Visit: Admitting: Family

## 2024-02-23 ENCOUNTER — Ambulatory Visit: Admitting: Family

## 2024-02-27 ENCOUNTER — Ambulatory Visit: Admitting: Family

## 2024-03-05 ENCOUNTER — Other Ambulatory Visit: Payer: Self-pay | Admitting: Family

## 2024-03-12 ENCOUNTER — Other Ambulatory Visit: Payer: Self-pay | Admitting: Family

## 2024-03-12 ENCOUNTER — Ambulatory Visit: Admitting: Family

## 2024-03-12 DIAGNOSIS — F4321 Adjustment disorder with depressed mood: Secondary | ICD-10-CM

## 2024-03-20 ENCOUNTER — Ambulatory Visit: Admitting: Family

## 2024-03-26 ENCOUNTER — Other Ambulatory Visit: Payer: Self-pay | Admitting: Family

## 2024-03-27 MED ORDER — LISDEXAMFETAMINE DIMESYLATE 40 MG PO CAPS: 40.0000 mg | ORAL_CAPSULE | ORAL | 0 refills | Status: DC

## 2024-04-09 ENCOUNTER — Ambulatory Visit: Admitting: Family

## 2024-04-12 ENCOUNTER — Encounter: Admission: RE | Payer: Self-pay | Source: Home / Self Care

## 2024-04-12 ENCOUNTER — Ambulatory Visit: Admission: RE | Admit: 2024-04-12 | Source: Home / Self Care | Admitting: Gastroenterology

## 2024-04-12 SURGERY — COLONOSCOPY
Anesthesia: General

## 2024-05-03 ENCOUNTER — Encounter: Payer: Self-pay | Admitting: Family

## 2024-05-03 ENCOUNTER — Ambulatory Visit (INDEPENDENT_AMBULATORY_CARE_PROVIDER_SITE_OTHER): Admitting: Family

## 2024-05-03 VITALS — BP 122/84 | HR 76 | Ht 63.0 in | Wt 259.4 lb

## 2024-05-03 DIAGNOSIS — R5383 Other fatigue: Secondary | ICD-10-CM

## 2024-05-03 DIAGNOSIS — R300 Dysuria: Secondary | ICD-10-CM

## 2024-05-03 DIAGNOSIS — J0110 Acute frontal sinusitis, unspecified: Secondary | ICD-10-CM

## 2024-05-03 DIAGNOSIS — E782 Mixed hyperlipidemia: Secondary | ICD-10-CM

## 2024-05-03 DIAGNOSIS — E538 Deficiency of other specified B group vitamins: Secondary | ICD-10-CM

## 2024-05-03 DIAGNOSIS — E559 Vitamin D deficiency, unspecified: Secondary | ICD-10-CM

## 2024-05-03 DIAGNOSIS — E1165 Type 2 diabetes mellitus with hyperglycemia: Secondary | ICD-10-CM

## 2024-05-03 DIAGNOSIS — I10 Essential (primary) hypertension: Secondary | ICD-10-CM

## 2024-05-03 LAB — POCT URINALYSIS DIPSTICK
Bilirubin, UA: NEGATIVE
Glucose, UA: NEGATIVE
Ketones, UA: NEGATIVE
Leukocytes, UA: NEGATIVE — AB
Nitrite, UA: NEGATIVE
Protein, UA: NEGATIVE
Spec Grav, UA: 1.025
Urobilinogen, UA: 0.2 U/dL
pH, UA: 6

## 2024-05-03 LAB — POC CREATINE & ALBUMIN,URINE
Albumin/Creatinine Ratio, Urine, POC: <30
Creatinine, POC: 300 mg/dL
Microalbumin Ur, POC: 30 mg/L

## 2024-05-03 MED ORDER — MOUNJARO 12.5 MG/0.5ML ~~LOC~~ SOAJ: 12.5000 mg | SUBCUTANEOUS | 0 refills | Status: AC

## 2024-05-03 MED ORDER — DOXYCYCLINE HYCLATE 100 MG PO TABS: 100.0000 mg | ORAL_TABLET | Freq: Two times a day (BID) | ORAL | 0 refills | Status: AC

## 2024-05-03 MED ORDER — MOUNJARO 15 MG/0.5ML ~~LOC~~ SOAJ: 15.0000 mg | SUBCUTANEOUS | 2 refills | Status: AC

## 2024-05-03 NOTE — Progress Notes (Unsigned)
 "  Established Patient Office Visit  Subjective:  Patient ID: Erica Ramos, female    DOB: 25-Jan-1976  Age: 49 y.o. MRN: [id number]  Chief Complaint  Patient presents with   Follow-up    Has seen ophthalmology, goes back on 01/28.  Is going to have a biopsy of her eye to see what is happening.   Has also been unofficially diagnosed with HBID, rare eye disorder.  Linked to [facility], near where she's from.    No other concerns at this time.   Past Medical History:  Diagnosis Date   Abscess of axilla, left 05/01/2013   Allergy     Anxiety    Boil, breast 10/22/2019   Bronchitis    chronic - has flare every December   Cervical polyp    hx of   Chest pain 05/01/2013   Delivered by cesarean section 06/05/2016   Depression, major, single episode, moderate (HCC) 04/19/2018   Environmental allergies    Genital warts    GERD (gastroesophageal reflux disease)    Per MD chart note 12/01/2010   Heart murmur    History of uterine fibroid 06/18/2020   Exophytic/Pedunculated   Hx of abnormal Pap smear 2012   Hypertension    Morbid obesity (HCC)    BMI 47.9   Persistent headaches    worst during menstrual cycle   PVC (premature ventricular contraction) 02/20/2015   Screening for blood or protein in urine 09/17/2019   STD (sexually transmitted disease)    hx genital warts/?HSV   Vaginal bleeding 07/15/2018   Vaginal Pap smear, abnormal     Past Surgical History:  Procedure Laterality Date   CESAREAN SECTION N/A 06/05/2016   Procedure: CESAREAN SECTION;  Surgeon: Jerolyn Foil, MD;  Location: [facility];  Service: Obstetrics;  Laterality: N/A;   CESAREAN SECTION N/A 09/20/2017   Procedure: CESAREAN SECTION;  Surgeon: Rosalva Sawyer, MD;  Location: [facility] SU[facility];  Service: Obstetrics;  Laterality: N/A;  EDD 10/02/17   COLPOSCOPY  2012   no treatment to cervix--pap smears reverted to normal   WISDOM TOOTH EXTRACTION      Social History    Socioeconomic History   Marital status: Single    Spouse name: Not on file   Number of children: 0   Years of education: Not on file   Highest education level: Not on file  Occupational History    Employer: [employer]  Tobacco Use   Smoking status: Never   Smokeless tobacco: Never  Vaping Use   Vaping status: Never Used  Substance and Sexual Activity   Alcohol use: Yes    Alcohol/week: 1.0 standard drink of alcohol    Types: 1 Standard drinks or equivalent per week    Comment: Occasionally; less than 1-2 a week   Drug use: No   Sexual activity: Yes    Partners: Male    Birth control/protection: Condom    Comment: condoms sometimes  Other Topics Concern   Not on file  Social History Narrative   Exercise--  2-3 x a week for about 30 min- 60 min   Social Drivers of Health   Tobacco Use: Low Risk (05/03/2024)   Patient History    Smoking Tobacco Use: Never    Smokeless Tobacco Use: Never    Passive Exposure: Not on file  Financial Resource Strain: Not on file  Food Insecurity: Not on file  Transportation Needs: Not on file  Physical Activity: Not on file  Stress: Not  on file  Social Connections: Not on file  Intimate Partner Violence: Not on file  Depression [id number]): Low Risk (10/27/2022)   Depression (PHQ2-9)    PHQ-2 Score: 1  Alcohol Screen: Not on file  Housing: Unknown (07/07/2023)   [facility]   Epic    Unable to Pay for Housing in the Last Year: Not on file    Number of Times Moved in the Last Year: Not on file    At any time in the past 12 months, were you homeless or living in a shelter (including now)?: No  Utilities: Not on file  Health Literacy: Not on file    Family History  Problem Relation Age of Onset   Diabetes Mother    Hypertension Mother        Under control   Diabetes Maternal Aunt    Seizures Maternal Aunt    Diabetes Maternal Grandmother    Hypertension Maternal  Grandmother    Heart Problems Maternal Grandmother    Alcohol abuse Father    Diabetes Father    Hypertension Sister    Diabetes Maternal Uncle    Hypertension Other    Obesity Other    Heart attack Paternal Grandmother    Heart Problems Paternal Grandmother    COPD Neg Hx     Allergies[1]  Review of Systems  All other systems reviewed and are negative.      Objective:   BP 122/84   Pulse 76   Ht 5' 3 (1.6 m)   Wt 259 lb 6.4 oz (117.7 kg)   SpO2 99%   BMI 45.95 kg/m   Vitals:   05/03/24 1145  BP: 122/84  Pulse: 76  Height: 5' 3 (1.6 m)  Weight: 259 lb 6.4 oz (117.7 kg)  SpO2: 99%  BMI (Calculated): 45.96    Physical Exam Vitals and nursing note reviewed.  Constitutional:      Appearance: Normal appearance. She is normal weight.  HENT:     Head: Normocephalic.  Eyes:     Extraocular Movements: Extraocular movements intact.     Conjunctiva/sclera: Conjunctivae normal.     Pupils: Pupils are equal, round, and reactive to light.  Cardiovascular:     Rate and Rhythm: Normal rate.  Pulmonary:     Effort: Pulmonary effort is normal.  Neurological:     General: No focal deficit present.     Mental Status: She is alert and oriented to person, place, and time. Mental status is at baseline.  Psychiatric:        Mood and Affect: Mood normal.        Behavior: Behavior normal.        Thought Content: Thought content normal.      No results found for any visits on 05/03/24.  No results found for this or any previous visit (from the past 2160 hours).     Assessment & Plan:   Assessment & Plan Type 2 diabetes mellitus with hyperglycemia, without long-term current use of insulin (HCC)  Vitamin D deficiency  Essential hypertension  Mixed hyperlipidemia  B12 deficiency due to diet  Other fatigue  Dysuria  Acute non-recurrent frontal sinusitis     No follow-ups on file.   Total time spent: {AMA time spent:29001} minutes  ALAN CHRISTELLA ARRANT, FNP  05/03/2024   This document may have been prepared by [facility] Voice Recognition software and as such may include unintentional dictation errors.       [  1] Allergies Allergen Reactions   Bacitracin-Neomycin-Polymyxin Swelling    Facial swelling with topical Neosporin   Neosporin [Neomycin-Bacitracin Zn-Polymyx] Anaphylaxis   Penicillins Nausea And Vomiting, Other (See Comments) and Rash    Has patient had a PCN reaction causing immediate rash, facial/tongue/throat swelling, SOB or lightheadedness with hypotension: Yes  Has patient had a PCN reaction causing severe rash involving mucus membranes or skin necrosis: Yes  Has patient had a PCN reaction that required hospitalization Yes  Has patient had a PCN reaction occurring within the last 10 years: No  If all of the above answers are NO, then may proceed with Cephalosporin use.  fever   Strawberry Flavoring Agent (Non-Screening)   "

## 2024-05-06 ENCOUNTER — Telehealth: Payer: Self-pay | Admitting: Family

## 2024-05-06 ENCOUNTER — Other Ambulatory Visit

## 2024-05-06 NOTE — Telephone Encounter (Signed)
 Patient left VM that she was returning a call about her urine results.  I did not call her, did you?

## 2024-05-07 ENCOUNTER — Ambulatory Visit: Payer: Self-pay

## 2024-05-07 ENCOUNTER — Other Ambulatory Visit: Payer: Self-pay

## 2024-05-07 DIAGNOSIS — I10 Essential (primary) hypertension: Secondary | ICD-10-CM

## 2024-05-07 LAB — CMP14+EGFR
ALT: 6 IU/L (ref 0–32)
AST: 13 IU/L (ref 0–40)
Albumin: 4.4 g/dL (ref 3.9–4.9)
Alkaline Phosphatase: 65 IU/L (ref 41–116)
BUN/Creatinine Ratio: 10 (ref 9–23)
BUN: 11 mg/dL (ref 6–24)
Bilirubin Total: 0.9 mg/dL (ref 0.0–1.2)
CO2: 23 mmol/L (ref 20–29)
Calcium: 9.9 mg/dL (ref 8.7–10.2)
Chloride: 101 mmol/L (ref 96–106)
Creatinine, Ser: 1.15 mg/dL — ABNORMAL HIGH (ref 0.57–1.00)
Globulin, Total: 2.5 g/dL (ref 1.5–4.5)
Glucose: 116 mg/dL — ABNORMAL HIGH (ref 70–99)
Potassium: 3.7 mmol/L (ref 3.5–5.2)
Sodium: 137 mmol/L (ref 134–144)
Total Protein: 6.9 g/dL (ref 6.0–8.5)
eGFR: 59 mL/min/1.73 — ABNORMAL LOW

## 2024-05-07 LAB — CBC WITH DIFFERENTIAL/PLATELET
Basophils Absolute: 0 x10E3/uL (ref 0.0–0.2)
Basos: 1 %
EOS (ABSOLUTE): 0 x10E3/uL (ref 0.0–0.4)
Eos: 1 %
Hematocrit: 35 % (ref 34.0–46.6)
Hemoglobin: 11.3 g/dL (ref 11.1–15.9)
Immature Grans (Abs): 0 x10E3/uL (ref 0.0–0.1)
Immature Granulocytes: 0 %
Lymphocytes Absolute: 1.8 x10E3/uL (ref 0.7–3.1)
Lymphs: 36 %
MCH: 31.4 pg (ref 26.6–33.0)
MCHC: 32.3 g/dL (ref 31.5–35.7)
MCV: 97 fL (ref 79–97)
Monocytes Absolute: 0.4 x10E3/uL (ref 0.1–0.9)
Monocytes: 7 %
Neutrophils Absolute: 2.7 x10E3/uL (ref 1.4–7.0)
Neutrophils: 55 %
Platelets: 177 x10E3/uL (ref 150–450)
RBC: 3.6 x10E6/uL — ABNORMAL LOW (ref 3.77–5.28)
RDW: 11.9 % (ref 11.7–15.4)
WBC: 4.9 x10E3/uL (ref 3.4–10.8)

## 2024-05-07 LAB — LIPID PANEL
Chol/HDL Ratio: 2.1 ratio (ref 0.0–4.4)
Cholesterol, Total: 148 mg/dL (ref 100–199)
HDL: 71 mg/dL
LDL Chol Calc (NIH): 67 mg/dL (ref 0–99)
Triglycerides: 41 mg/dL (ref 0–149)
VLDL Cholesterol Cal: 10 mg/dL (ref 5–40)

## 2024-05-07 LAB — HEMOGLOBIN A1C
Est. average glucose Bld gHb Est-mCnc: 103 mg/dL
Hgb A1c MFr Bld: 5.2 % (ref 4.8–5.6)

## 2024-05-07 LAB — TSH: TSH: 0.632 u[IU]/mL (ref 0.450–4.500)

## 2024-05-07 LAB — VITAMIN D 25 HYDROXY (VIT D DEFICIENCY, FRACTURES): Vit D, 25-Hydroxy: 58.2 ng/mL (ref 30.0–100.0)

## 2024-05-07 LAB — VITAMIN B12: Vitamin B-12: 626 pg/mL (ref 232–1245)

## 2024-05-21 ENCOUNTER — Encounter: Admission: RE | Payer: Self-pay | Source: Home / Self Care

## 2024-05-21 ENCOUNTER — Ambulatory Visit: Admission: RE | Admit: 2024-05-21 | Source: Home / Self Care | Admitting: Gastroenterology

## 2024-05-21 SURGERY — COLONOSCOPY
Anesthesia: General

## 2024-05-28 ENCOUNTER — Other Ambulatory Visit: Payer: Self-pay | Admitting: Family

## 2024-05-28 DIAGNOSIS — R41840 Attention and concentration deficit: Secondary | ICD-10-CM

## 2024-05-28 DIAGNOSIS — Z79899 Other long term (current) drug therapy: Secondary | ICD-10-CM

## 2024-05-30 ENCOUNTER — Other Ambulatory Visit

## 2024-05-30 MED ORDER — LISDEXAMFETAMINE DIMESYLATE 40 MG PO CAPS: 40.0000 mg | ORAL_CAPSULE | ORAL | 0 refills | Status: AC

## 2024-05-31 ENCOUNTER — Telehealth: Payer: Self-pay | Admitting: Family

## 2024-05-31 ENCOUNTER — Ambulatory Visit: Payer: Self-pay

## 2024-05-31 LAB — CMP14+EGFR
ALT: 13 [IU]/L (ref 0–32)
AST: 10 [IU]/L (ref 0–40)
Albumin: 4.1 g/dL (ref 3.9–4.9)
Alkaline Phosphatase: 77 [IU]/L (ref 41–116)
BUN/Creatinine Ratio: 27 — ABNORMAL HIGH (ref 9–23)
BUN: 18 mg/dL (ref 6–24)
Bilirubin Total: 0.3 mg/dL (ref 0.0–1.2)
CO2: 19 mmol/L — ABNORMAL LOW (ref 20–29)
Calcium: 9.4 mg/dL (ref 8.7–10.2)
Chloride: 107 mmol/L — ABNORMAL HIGH (ref 96–106)
Creatinine, Ser: 0.66 mg/dL (ref 0.57–1.00)
Globulin, Total: 2.9 g/dL (ref 1.5–4.5)
Glucose: 75 mg/dL (ref 70–99)
Potassium: 4.7 mmol/L (ref 3.5–5.2)
Sodium: 140 mmol/L (ref 134–144)
Total Protein: 7 g/dL (ref 6.0–8.5)
eGFR: 109 mL/min/{1.73_m2}

## 2024-06-01 ENCOUNTER — Other Ambulatory Visit: Payer: Self-pay | Admitting: Student

## 2024-06-01 DIAGNOSIS — I10 Essential (primary) hypertension: Secondary | ICD-10-CM

## 2024-06-05 LAB — TOXASSURE SELECT 13 (MW), URINE

## 2024-06-06 ENCOUNTER — Ambulatory Visit: Payer: Self-pay

## 2024-06-11 ENCOUNTER — Ambulatory Visit: Admitting: Family

## 2024-06-14 ENCOUNTER — Ambulatory Visit: Admitting: Cardiology

## 2024-10-01 ENCOUNTER — Ambulatory Visit: Admitting: Family
